# Patient Record
Sex: Female | Born: 1970
Health system: Southern US, Community
[De-identification: ages and names within clinical notes are randomized; demographics above are authoritative.]

## PROBLEM LIST (undated history)

## (undated) DIAGNOSIS — E559 Vitamin D deficiency, unspecified: Secondary | ICD-10-CM

## (undated) DIAGNOSIS — K219 Gastro-esophageal reflux disease without esophagitis: Secondary | ICD-10-CM

## (undated) DIAGNOSIS — M2550 Pain in unspecified joint: Secondary | ICD-10-CM

## (undated) DIAGNOSIS — E039 Hypothyroidism, unspecified: Secondary | ICD-10-CM

## (undated) DIAGNOSIS — D649 Anemia, unspecified: Secondary | ICD-10-CM

## (undated) DIAGNOSIS — E538 Deficiency of other specified B group vitamins: Secondary | ICD-10-CM

## (undated) DIAGNOSIS — R0600 Dyspnea, unspecified: Secondary | ICD-10-CM

## (undated) DIAGNOSIS — M549 Dorsalgia, unspecified: Secondary | ICD-10-CM

## (undated) DIAGNOSIS — R030 Elevated blood-pressure reading, without diagnosis of hypertension: Secondary | ICD-10-CM

## (undated) DIAGNOSIS — R232 Flushing: Secondary | ICD-10-CM

## (undated) DIAGNOSIS — M5432 Sciatica, left side: Secondary | ICD-10-CM

## (undated) DIAGNOSIS — K2210 Ulcer of esophagus without bleeding: Secondary | ICD-10-CM

## (undated) HISTORY — DX: Deficiency of other specified B group vitamins: E53.8

## (undated) HISTORY — DX: Anemia, unspecified: D64.9

## (undated) HISTORY — DX: Dorsalgia, unspecified: M54.9

## (undated) HISTORY — DX: Elevated blood-pressure reading, without diagnosis of hypertension: R03.0

## (undated) HISTORY — DX: Hypothyroidism, unspecified: E03.9

## (undated) HISTORY — DX: Ulcer of esophagus without bleeding: K22.10

## (undated) HISTORY — DX: Flushing: R23.2

## (undated) HISTORY — DX: Sciatica, left side: M54.32

## (undated) HISTORY — DX: Dyspnea, unspecified: R06.00

## (undated) HISTORY — DX: Vitamin D deficiency, unspecified: E55.9

## (undated) HISTORY — DX: Gastro-esophageal reflux disease without esophagitis: K21.9

## (undated) HISTORY — PX: OVARIAN CYST SURGERY: SHX726

## (undated) HISTORY — PX: DILATION AND CURETTAGE OF UTERUS: SHX78

## (undated) HISTORY — DX: Pain in unspecified joint: M25.50

---

## 2000-08-04 ENCOUNTER — Encounter: Payer: Self-pay | Admitting: Emergency Medicine

## 2000-08-04 ENCOUNTER — Emergency Department (HOSPITAL_COMMUNITY): Admission: EM | Admit: 2000-08-04 | Discharge: 2000-08-04 | Payer: Self-pay | Admitting: Emergency Medicine

## 2004-10-26 ENCOUNTER — Other Ambulatory Visit: Admission: RE | Admit: 2004-10-26 | Discharge: 2004-10-26 | Payer: Self-pay | Admitting: Family Medicine

## 2005-06-22 ENCOUNTER — Encounter: Admission: RE | Admit: 2005-06-22 | Discharge: 2005-06-22 | Payer: Self-pay | Admitting: Family Medicine

## 2006-01-04 ENCOUNTER — Other Ambulatory Visit: Admission: RE | Admit: 2006-01-04 | Discharge: 2006-01-04 | Payer: Self-pay | Admitting: Family Medicine

## 2006-12-29 ENCOUNTER — Emergency Department (HOSPITAL_COMMUNITY): Admission: EM | Admit: 2006-12-29 | Discharge: 2006-12-29 | Payer: Self-pay | Admitting: Emergency Medicine

## 2007-09-07 ENCOUNTER — Inpatient Hospital Stay (HOSPITAL_COMMUNITY): Admission: AD | Admit: 2007-09-07 | Discharge: 2007-09-10 | Payer: Self-pay | Admitting: Obstetrics and Gynecology

## 2007-09-16 ENCOUNTER — Ambulatory Visit: Admission: RE | Admit: 2007-09-16 | Discharge: 2007-09-16 | Payer: Self-pay | Admitting: Obstetrics and Gynecology

## 2009-01-06 ENCOUNTER — Emergency Department (HOSPITAL_COMMUNITY): Admission: EM | Admit: 2009-01-06 | Discharge: 2009-01-06 | Payer: Self-pay | Admitting: Emergency Medicine

## 2009-10-05 ENCOUNTER — Encounter: Admission: RE | Admit: 2009-10-05 | Discharge: 2009-10-05 | Payer: Self-pay | Admitting: Gastroenterology

## 2010-07-24 ENCOUNTER — Emergency Department (HOSPITAL_COMMUNITY): Admission: EM | Admit: 2010-07-24 | Discharge: 2010-07-24 | Payer: Self-pay | Admitting: Emergency Medicine

## 2010-07-27 ENCOUNTER — Emergency Department (HOSPITAL_COMMUNITY): Admission: EM | Admit: 2010-07-27 | Discharge: 2010-07-27 | Payer: Self-pay | Admitting: Family Medicine

## 2010-07-31 ENCOUNTER — Emergency Department (HOSPITAL_COMMUNITY): Admission: EM | Admit: 2010-07-31 | Discharge: 2010-07-31 | Payer: Self-pay | Admitting: Emergency Medicine

## 2010-08-07 ENCOUNTER — Emergency Department (HOSPITAL_COMMUNITY): Admission: EM | Admit: 2010-08-07 | Discharge: 2010-08-07 | Payer: Self-pay | Admitting: Family Medicine

## 2011-08-06 ENCOUNTER — Other Ambulatory Visit (HOSPITAL_COMMUNITY): Payer: Self-pay | Admitting: Obstetrics and Gynecology

## 2011-08-06 DIAGNOSIS — N979 Female infertility, unspecified: Secondary | ICD-10-CM

## 2011-08-14 ENCOUNTER — Ambulatory Visit (HOSPITAL_COMMUNITY)
Admission: RE | Admit: 2011-08-14 | Discharge: 2011-08-14 | Disposition: A | Discharge status: Home / Self Care | Payer: BC Managed Care – PPO | Source: Ambulatory Visit | Attending: Obstetrics and Gynecology | Admitting: Obstetrics and Gynecology

## 2011-08-14 DIAGNOSIS — N979 Female infertility, unspecified: Secondary | ICD-10-CM

## 2011-09-27 LAB — CBC
HCT: 26.6 — ABNORMAL LOW
HCT: 33.9 — ABNORMAL LOW
Hemoglobin: 11.9 — ABNORMAL LOW
Hemoglobin: 8.9 — ABNORMAL LOW
MCHC: 33.6
MCHC: 35.2
MCV: 83.1
MCV: 85.5
Platelets: 197
Platelets: 212
RBC: 4.07
RDW: 14.8 — ABNORMAL HIGH
RDW: 14.8 — ABNORMAL HIGH
WBC: 12.2 — ABNORMAL HIGH

## 2011-10-03 ENCOUNTER — Other Ambulatory Visit: Payer: Self-pay | Admitting: Obstetrics and Gynecology

## 2011-10-03 DIAGNOSIS — R928 Other abnormal and inconclusive findings on diagnostic imaging of breast: Secondary | ICD-10-CM

## 2011-10-18 ENCOUNTER — Ambulatory Visit
Admission: RE | Admit: 2011-10-18 | Discharge: 2011-10-18 | Disposition: A | Discharge status: Home / Self Care | Payer: BC Managed Care – PPO | Source: Ambulatory Visit | Attending: Obstetrics and Gynecology | Admitting: Obstetrics and Gynecology

## 2011-10-18 ENCOUNTER — Other Ambulatory Visit: Payer: Self-pay | Admitting: Obstetrics and Gynecology

## 2011-10-18 DIAGNOSIS — N63 Unspecified lump in unspecified breast: Secondary | ICD-10-CM

## 2011-10-18 DIAGNOSIS — R928 Other abnormal and inconclusive findings on diagnostic imaging of breast: Secondary | ICD-10-CM

## 2012-04-16 ENCOUNTER — Ambulatory Visit
Admission: RE | Admit: 2012-04-16 | Discharge: 2012-04-16 | Disposition: A | Discharge status: Home / Self Care | Payer: BC Managed Care – PPO | Source: Ambulatory Visit | Attending: Obstetrics and Gynecology | Admitting: Obstetrics and Gynecology

## 2012-04-16 DIAGNOSIS — N63 Unspecified lump in unspecified breast: Secondary | ICD-10-CM

## 2012-11-27 ENCOUNTER — Other Ambulatory Visit: Payer: Self-pay | Admitting: Obstetrics and Gynecology

## 2012-11-27 DIAGNOSIS — N63 Unspecified lump in unspecified breast: Secondary | ICD-10-CM

## 2012-12-12 ENCOUNTER — Other Ambulatory Visit: Payer: Self-pay | Admitting: Obstetrics and Gynecology

## 2012-12-12 ENCOUNTER — Ambulatory Visit
Admission: RE | Admit: 2012-12-12 | Discharge: 2012-12-12 | Disposition: A | Discharge status: Home / Self Care | Payer: BC Managed Care – PPO | Source: Ambulatory Visit | Attending: Obstetrics and Gynecology | Admitting: Obstetrics and Gynecology

## 2012-12-12 DIAGNOSIS — N63 Unspecified lump in unspecified breast: Secondary | ICD-10-CM

## 2013-06-15 ENCOUNTER — Other Ambulatory Visit: Payer: Self-pay | Admitting: Obstetrics and Gynecology

## 2013-06-15 DIAGNOSIS — N63 Unspecified lump in unspecified breast: Secondary | ICD-10-CM

## 2013-07-03 ENCOUNTER — Ambulatory Visit
Admission: RE | Admit: 2013-07-03 | Discharge: 2013-07-03 | Disposition: A | Discharge status: Home / Self Care | Payer: BC Managed Care – PPO | Source: Ambulatory Visit | Attending: Obstetrics and Gynecology | Admitting: Obstetrics and Gynecology

## 2013-07-03 DIAGNOSIS — N63 Unspecified lump in unspecified breast: Secondary | ICD-10-CM

## 2014-01-01 ENCOUNTER — Other Ambulatory Visit: Payer: Self-pay

## 2014-01-01 DIAGNOSIS — Z1231 Encounter for screening mammogram for malignant neoplasm of breast: Secondary | ICD-10-CM

## 2014-02-03 ENCOUNTER — Ambulatory Visit: Payer: BC Managed Care – PPO

## 2014-03-10 ENCOUNTER — Other Ambulatory Visit: Payer: Self-pay | Admitting: Obstetrics and Gynecology

## 2014-03-10 ENCOUNTER — Ambulatory Visit
Admission: RE | Admit: 2014-03-10 | Discharge: 2014-03-10 | Disposition: A | Discharge status: Home / Self Care | Payer: Self-pay | Source: Ambulatory Visit

## 2014-03-10 DIAGNOSIS — N6489 Other specified disorders of breast: Secondary | ICD-10-CM

## 2014-03-10 DIAGNOSIS — Z1231 Encounter for screening mammogram for malignant neoplasm of breast: Secondary | ICD-10-CM

## 2014-04-27 ENCOUNTER — Ambulatory Visit
Admission: RE | Admit: 2014-04-27 | Discharge: 2014-04-27 | Disposition: A | Discharge status: Home / Self Care | Payer: BC Managed Care – PPO | Source: Ambulatory Visit | Attending: Family Medicine | Admitting: Family Medicine

## 2014-04-27 ENCOUNTER — Other Ambulatory Visit: Payer: Self-pay | Admitting: Family Medicine

## 2014-04-27 DIAGNOSIS — R52 Pain, unspecified: Secondary | ICD-10-CM

## 2014-04-27 MED ORDER — IOHEXOL 300 MG/ML  SOLN
75.0000 mL | Freq: Once | INTRAMUSCULAR | Status: AC | PRN
  Administered 2014-04-27: 75 mL via INTRAVENOUS

## 2014-07-21 ENCOUNTER — Ambulatory Visit
Admission: RE | Admit: 2014-07-21 | Discharge: 2014-07-21 | Disposition: A | Discharge status: Home / Self Care | Payer: BC Managed Care – PPO | Source: Ambulatory Visit | Attending: Obstetrics and Gynecology | Admitting: Obstetrics and Gynecology

## 2014-07-21 ENCOUNTER — Encounter (INDEPENDENT_AMBULATORY_CARE_PROVIDER_SITE_OTHER): Payer: Self-pay

## 2014-07-21 DIAGNOSIS — N6489 Other specified disorders of breast: Secondary | ICD-10-CM

## 2016-03-28 DIAGNOSIS — F331 Major depressive disorder, recurrent, moderate: Secondary | ICD-10-CM | POA: Diagnosis not present

## 2016-09-17 DIAGNOSIS — Z23 Encounter for immunization: Secondary | ICD-10-CM | POA: Diagnosis not present

## 2016-09-17 DIAGNOSIS — L814 Other melanin hyperpigmentation: Secondary | ICD-10-CM | POA: Diagnosis not present

## 2016-09-19 DIAGNOSIS — E559 Vitamin D deficiency, unspecified: Secondary | ICD-10-CM | POA: Diagnosis not present

## 2016-09-19 DIAGNOSIS — R7309 Other abnormal glucose: Secondary | ICD-10-CM | POA: Diagnosis not present

## 2016-09-19 DIAGNOSIS — E538 Deficiency of other specified B group vitamins: Secondary | ICD-10-CM | POA: Diagnosis not present

## 2016-09-19 DIAGNOSIS — Z713 Dietary counseling and surveillance: Secondary | ICD-10-CM | POA: Diagnosis not present

## 2016-09-19 DIAGNOSIS — F324 Major depressive disorder, single episode, in partial remission: Secondary | ICD-10-CM | POA: Diagnosis not present

## 2016-09-19 DIAGNOSIS — R7303 Prediabetes: Secondary | ICD-10-CM | POA: Diagnosis not present

## 2016-11-13 DIAGNOSIS — Z6839 Body mass index (BMI) 39.0-39.9, adult: Secondary | ICD-10-CM | POA: Diagnosis not present

## 2016-11-13 DIAGNOSIS — E039 Hypothyroidism, unspecified: Secondary | ICD-10-CM | POA: Diagnosis not present

## 2016-11-13 DIAGNOSIS — Z01419 Encounter for gynecological examination (general) (routine) without abnormal findings: Secondary | ICD-10-CM | POA: Diagnosis not present

## 2016-11-13 DIAGNOSIS — Z1231 Encounter for screening mammogram for malignant neoplasm of breast: Secondary | ICD-10-CM | POA: Diagnosis not present

## 2016-11-29 DIAGNOSIS — Z713 Dietary counseling and surveillance: Secondary | ICD-10-CM | POA: Diagnosis not present

## 2016-11-29 DIAGNOSIS — Z6839 Body mass index (BMI) 39.0-39.9, adult: Secondary | ICD-10-CM | POA: Diagnosis not present

## 2016-11-29 DIAGNOSIS — F325 Major depressive disorder, single episode, in full remission: Secondary | ICD-10-CM | POA: Diagnosis not present

## 2016-11-29 DIAGNOSIS — E6609 Other obesity due to excess calories: Secondary | ICD-10-CM | POA: Diagnosis not present

## 2016-12-05 DIAGNOSIS — J019 Acute sinusitis, unspecified: Secondary | ICD-10-CM | POA: Diagnosis not present

## 2016-12-05 DIAGNOSIS — R05 Cough: Secondary | ICD-10-CM | POA: Diagnosis not present

## 2017-01-09 DIAGNOSIS — Z13820 Encounter for screening for osteoporosis: Secondary | ICD-10-CM | POA: Diagnosis not present

## 2017-01-09 DIAGNOSIS — Z8262 Family history of osteoporosis: Secondary | ICD-10-CM | POA: Diagnosis not present

## 2017-01-09 DIAGNOSIS — E039 Hypothyroidism, unspecified: Secondary | ICD-10-CM | POA: Diagnosis not present

## 2017-01-18 DIAGNOSIS — S93402A Sprain of unspecified ligament of left ankle, initial encounter: Secondary | ICD-10-CM | POA: Diagnosis not present

## 2017-01-18 DIAGNOSIS — S93602A Unspecified sprain of left foot, initial encounter: Secondary | ICD-10-CM | POA: Diagnosis not present

## 2017-03-13 DIAGNOSIS — Z6839 Body mass index (BMI) 39.0-39.9, adult: Secondary | ICD-10-CM | POA: Diagnosis not present

## 2017-03-13 DIAGNOSIS — Z713 Dietary counseling and surveillance: Secondary | ICD-10-CM | POA: Diagnosis not present

## 2017-03-13 DIAGNOSIS — E6609 Other obesity due to excess calories: Secondary | ICD-10-CM | POA: Diagnosis not present

## 2017-08-13 DIAGNOSIS — K219 Gastro-esophageal reflux disease without esophagitis: Secondary | ICD-10-CM | POA: Diagnosis not present

## 2017-08-18 DIAGNOSIS — K219 Gastro-esophageal reflux disease without esophagitis: Secondary | ICD-10-CM | POA: Insufficient documentation

## 2017-08-18 DIAGNOSIS — E038 Other specified hypothyroidism: Secondary | ICD-10-CM | POA: Insufficient documentation

## 2017-10-15 DIAGNOSIS — E039 Hypothyroidism, unspecified: Secondary | ICD-10-CM | POA: Diagnosis not present

## 2017-10-15 DIAGNOSIS — Z79899 Other long term (current) drug therapy: Secondary | ICD-10-CM | POA: Diagnosis not present

## 2017-10-15 DIAGNOSIS — E538 Deficiency of other specified B group vitamins: Secondary | ICD-10-CM | POA: Diagnosis not present

## 2017-10-15 DIAGNOSIS — Z6838 Body mass index (BMI) 38.0-38.9, adult: Secondary | ICD-10-CM | POA: Diagnosis not present

## 2017-10-15 DIAGNOSIS — R7303 Prediabetes: Secondary | ICD-10-CM | POA: Diagnosis not present

## 2017-10-15 DIAGNOSIS — Z23 Encounter for immunization: Secondary | ICD-10-CM | POA: Diagnosis not present

## 2017-10-15 DIAGNOSIS — E559 Vitamin D deficiency, unspecified: Secondary | ICD-10-CM | POA: Diagnosis not present

## 2017-10-15 DIAGNOSIS — K219 Gastro-esophageal reflux disease without esophagitis: Secondary | ICD-10-CM | POA: Diagnosis not present

## 2017-10-16 DIAGNOSIS — G43B0 Ophthalmoplegic migraine, not intractable: Secondary | ICD-10-CM | POA: Diagnosis not present

## 2017-12-17 HISTORY — PX: OTHER SURGICAL HISTORY: SHX169

## 2018-01-01 DIAGNOSIS — Z01419 Encounter for gynecological examination (general) (routine) without abnormal findings: Secondary | ICD-10-CM | POA: Diagnosis not present

## 2018-01-01 DIAGNOSIS — Z6838 Body mass index (BMI) 38.0-38.9, adult: Secondary | ICD-10-CM | POA: Diagnosis not present

## 2018-01-01 DIAGNOSIS — Z1231 Encounter for screening mammogram for malignant neoplasm of breast: Secondary | ICD-10-CM | POA: Diagnosis not present

## 2018-01-27 DIAGNOSIS — J069 Acute upper respiratory infection, unspecified: Secondary | ICD-10-CM | POA: Diagnosis not present

## 2018-04-06 DIAGNOSIS — M5417 Radiculopathy, lumbosacral region: Secondary | ICD-10-CM | POA: Diagnosis not present

## 2018-04-06 DIAGNOSIS — M533 Sacrococcygeal disorders, not elsewhere classified: Secondary | ICD-10-CM | POA: Diagnosis not present

## 2018-04-06 DIAGNOSIS — G5702 Lesion of sciatic nerve, left lower limb: Secondary | ICD-10-CM | POA: Diagnosis not present

## 2018-04-06 DIAGNOSIS — M5432 Sciatica, left side: Secondary | ICD-10-CM | POA: Diagnosis not present

## 2018-04-17 DIAGNOSIS — M5432 Sciatica, left side: Secondary | ICD-10-CM | POA: Diagnosis not present

## 2018-04-17 DIAGNOSIS — R030 Elevated blood-pressure reading, without diagnosis of hypertension: Secondary | ICD-10-CM | POA: Diagnosis not present

## 2018-04-17 DIAGNOSIS — E6609 Other obesity due to excess calories: Secondary | ICD-10-CM | POA: Diagnosis not present

## 2018-04-29 DIAGNOSIS — M5416 Radiculopathy, lumbar region: Secondary | ICD-10-CM | POA: Diagnosis not present

## 2018-04-29 DIAGNOSIS — R293 Abnormal posture: Secondary | ICD-10-CM | POA: Diagnosis not present

## 2018-04-29 DIAGNOSIS — M25552 Pain in left hip: Secondary | ICD-10-CM | POA: Diagnosis not present

## 2018-05-01 DIAGNOSIS — R293 Abnormal posture: Secondary | ICD-10-CM | POA: Diagnosis not present

## 2018-05-01 DIAGNOSIS — M25552 Pain in left hip: Secondary | ICD-10-CM | POA: Diagnosis not present

## 2018-05-01 DIAGNOSIS — M5416 Radiculopathy, lumbar region: Secondary | ICD-10-CM | POA: Diagnosis not present

## 2018-05-06 ENCOUNTER — Encounter (INDEPENDENT_AMBULATORY_CARE_PROVIDER_SITE_OTHER): Payer: Self-pay

## 2018-05-06 DIAGNOSIS — M5416 Radiculopathy, lumbar region: Secondary | ICD-10-CM | POA: Diagnosis not present

## 2018-05-06 DIAGNOSIS — M25552 Pain in left hip: Secondary | ICD-10-CM | POA: Diagnosis not present

## 2018-05-06 DIAGNOSIS — R262 Difficulty in walking, not elsewhere classified: Secondary | ICD-10-CM | POA: Diagnosis not present

## 2018-05-06 DIAGNOSIS — R293 Abnormal posture: Secondary | ICD-10-CM | POA: Diagnosis not present

## 2018-05-08 DIAGNOSIS — M25552 Pain in left hip: Secondary | ICD-10-CM | POA: Diagnosis not present

## 2018-05-08 DIAGNOSIS — R293 Abnormal posture: Secondary | ICD-10-CM | POA: Diagnosis not present

## 2018-05-08 DIAGNOSIS — M5416 Radiculopathy, lumbar region: Secondary | ICD-10-CM | POA: Diagnosis not present

## 2018-05-08 DIAGNOSIS — R262 Difficulty in walking, not elsewhere classified: Secondary | ICD-10-CM | POA: Diagnosis not present

## 2018-05-09 DIAGNOSIS — M5416 Radiculopathy, lumbar region: Secondary | ICD-10-CM | POA: Diagnosis not present

## 2018-05-09 DIAGNOSIS — R293 Abnormal posture: Secondary | ICD-10-CM | POA: Diagnosis not present

## 2018-05-09 DIAGNOSIS — M25552 Pain in left hip: Secondary | ICD-10-CM | POA: Diagnosis not present

## 2018-05-09 DIAGNOSIS — R262 Difficulty in walking, not elsewhere classified: Secondary | ICD-10-CM | POA: Diagnosis not present

## 2018-05-13 DIAGNOSIS — M25552 Pain in left hip: Secondary | ICD-10-CM | POA: Diagnosis not present

## 2018-05-13 DIAGNOSIS — R262 Difficulty in walking, not elsewhere classified: Secondary | ICD-10-CM | POA: Diagnosis not present

## 2018-05-13 DIAGNOSIS — R293 Abnormal posture: Secondary | ICD-10-CM | POA: Diagnosis not present

## 2018-05-13 DIAGNOSIS — M5416 Radiculopathy, lumbar region: Secondary | ICD-10-CM | POA: Diagnosis not present

## 2018-05-14 ENCOUNTER — Ambulatory Visit (INDEPENDENT_AMBULATORY_CARE_PROVIDER_SITE_OTHER): Payer: BLUE CROSS/BLUE SHIELD | Admitting: Family Medicine

## 2018-05-14 ENCOUNTER — Encounter (INDEPENDENT_AMBULATORY_CARE_PROVIDER_SITE_OTHER): Payer: Self-pay | Admitting: Family Medicine

## 2018-05-14 VITALS — BP 134/89 | HR 77 | Temp 98.0°F | Ht 67.0 in | Wt 242.0 lb

## 2018-05-14 DIAGNOSIS — E038 Other specified hypothyroidism: Secondary | ICD-10-CM

## 2018-05-14 DIAGNOSIS — E559 Vitamin D deficiency, unspecified: Secondary | ICD-10-CM

## 2018-05-14 DIAGNOSIS — R5383 Other fatigue: Secondary | ICD-10-CM | POA: Diagnosis not present

## 2018-05-14 DIAGNOSIS — R0602 Shortness of breath: Secondary | ICD-10-CM | POA: Diagnosis not present

## 2018-05-14 DIAGNOSIS — Z1331 Encounter for screening for depression: Secondary | ICD-10-CM

## 2018-05-14 DIAGNOSIS — Z9189 Other specified personal risk factors, not elsewhere classified: Secondary | ICD-10-CM

## 2018-05-14 DIAGNOSIS — Z6838 Body mass index (BMI) 38.0-38.9, adult: Secondary | ICD-10-CM

## 2018-05-14 DIAGNOSIS — Z0289 Encounter for other administrative examinations: Secondary | ICD-10-CM

## 2018-05-14 NOTE — Progress Notes (Signed)
Office: [phone]  /  Fax: [phone]   Dear Dr. Cliffton Asters,   Thank you for referring Katelyn Walker to our clinic. The following note includes my evaluation and treatment recommendations.  HPI:   Chief Complaint: OBESITY    Katelyn Walker has been referred by Katelyn Montana, MD for consultation regarding her obesity and obesity related comorbidities.    Katelyn Walker (MR# [id number]) is a 47 y.o. female who presents on 05/14/2018 for obesity evaluation and treatment. Current BMI is Body mass index is 37.9 kg/m.Marland Kitchen Katelyn Walker has been struggling with her weight for many years and has been unsuccessful in either losing weight, maintaining weight loss, or reaching her healthy weight goal.     Katelyn Walker had done Belviq and Contrave in the past but had nausea and dizziness with Contrave, and Belviq was not covered by insurance.     Katelyn Walker attended our information session and states she is currently in the action stage of change and ready to dedicate time achieving and maintaining a healthier weight. Katelyn Walker is interested in becoming our Katelyn Walker and working on intensive lifestyle modifications including (but not limited to) diet, exercise and weight loss.    Katelyn Walker states her family eats meals together she thinks her family will eat healthier with  her her desired weight loss is 62 lbs she has been heavy most of  her life she started gaining weight at age 74 her heaviest weight ever was 255 lbs she has significant food cravings issues  she frequently makes poor food choices she frequently eats larger portions than normal  she struggles with emotional eating    Fatigue Katelyn Walker feels her energy is lower than it should be. This has worsened with weight gain and has not worsened recently. Katelyn Walker admits to daytime somnolence and  admits to waking up still tired. Katelyn Walker is at risk for obstructive sleep apnea. Patent has a history of symptoms of daytime fatigue. Katelyn Walker generally  gets 6 hours of sleep per night, and states they generally have generally restful sleep. Snoring is present. Apneic episodes are present. Epworth Sleepiness Score is 5.  Dyspnea on exertion Katelyn Walker notes increasing shortness of breath with exercising and seems to be worsening over time with weight gain. She notes getting out of breath sooner with activity than she used to. This has not gotten worse recently. Katelyn Walker denies orthopnea.  Vitamin D Deficiency Katelyn Walker has a diagnosis of vitamin D deficiency. She is not on Vit D currently. She notes fatigue and denies nausea, vomiting or muscle weakness.  Hypothyroidism Katelyn Walker has a diagnosis of hypothyroidism. She is on levothyroxine. She denies tremor, hot or cold intolerance, or palpitations, but does admit to ongoing fatigue.  At risk for cardiovascular disease Katelyn Walker is at a higher than average risk for cardiovascular disease due to obesity and hypothyroidism. She currently denies any chest pain.  Depression Screen Katelyn Walker Food and Mood (modified PHQ-9) score was  Depression screen PHQ 2/9 05/14/2018  Decreased Interest 1  Down, Depressed, Hopeless 2  PHQ - 2 Score 3  Altered sleeping 3  Tired, decreased energy 2  Change in appetite 1  Feeling bad or failure about yourself  1  Trouble concentrating 1  Moving slowly or fidgety/restless 0  Suicidal thoughts 0  PHQ-9 Score 11  Difficult doing work/chores Not difficult at all    ALLERGIES: Allergies  Allergen Reactions  . Contrave [Naltrexone-Bupropion Hcl Er] Other (See Comments)    Vertigo/ dizziness    MEDICATIONS: Current Outpatient Medications  on File Prior to Visit  Medication Sig Dispense Refill  . buPROPion (WELLBUTRIN XL) 300 MG 24 hr tablet Take 300 mg by mouth daily.    Marland Kitchen HYDROcodone-acetaminophen (NORCO/VICODIN) 5-325 MG tablet Take 1 tablet by mouth every 6 (six) hours as needed for moderate pain (left side sciatica).    Marland Kitchen levothyroxine (SYNTHROID, LEVOTHROID)  75 MCG tablet Take 75 mcg by mouth daily before breakfast.    . naproxen (NAPROSYN) 500 MG tablet Take 500 mg by mouth 2 (two) times daily with a meal.    . Norethindrone Acetate-Ethinyl Estrad-FE (BLISOVI 24 FE) 1-20 MG-MCG(24) tablet Take 1 tablet by mouth daily.    Marland Kitchen omeprazole (PRILOSEC) 40 MG capsule Take 40 mg by mouth daily.    Marland Kitchen zolpidem (AMBIEN) 10 MG tablet Take 10 mg by mouth at bedtime as needed for sleep.     No current facility-administered medications on file prior to visit.     PAST MEDICAL HISTORY: Past Medical History:  Diagnosis Date  . Back pain   . Dyspnea   . Elevated blood pressure reading without diagnosis of hypertension   . Esophageal ulcer   . GERD (gastroesophageal reflux disease)   . Hot flashes   . Hypothyroid   . Joint pain   . Sciatica, left side   . Vitamin B 12 deficiency   . Vitamin D deficiency     PAST SURGICAL HISTORY: Past Surgical History:  Procedure Laterality Date  . DILATION AND CURETTAGE OF UTERUS     2011   . OVARIAN CYST SURGERY     2009, 2013    SOCIAL HISTORY: Social History   Tobacco Use  . Smoking status: Never Smoker  . Smokeless tobacco: Never Used  Substance Use Topics  . Alcohol use: Not on file  . Drug use: Not on file    FAMILY HISTORY: Family History  Problem Relation Age of Onset  . Alcoholism Mother   . Eating disorder Mother   . Diabetes Father   . Hypertension Father   . Sleep apnea Father     ROS: Review of Systems  Constitutional: Positive for malaise/fatigue. Negative for weight loss.       + Trouble sleeping  Eyes: Positive for photophobia.       + Wear glasses or contacts  Respiratory: Positive for shortness of breath (with exertion).   Cardiovascular: Negative for chest pain and orthopnea.  Gastrointestinal: Negative for nausea and vomiting.  Musculoskeletal: Positive for back pain.       Negative muscle weakness + Muscle or joint pain  Neurological: Negative for tremors.    Endo/Heme/Allergies:       Negative hot/cold intolerance    PHYSICAL EXAM: Blood pressure 134/89, pulse 77, temperature 98 F (36.7 C), temperature source Oral, height  (1.702 m), weight 242 lb (109.8 kg), SpO2 99 %. Body mass index is 37.9 kg/m. Physical Exam  Constitutional: She is oriented to person, place, and time. She appears well-developed and well-nourished.  HENT:  Head: Normocephalic and atraumatic.  Nose: Nose normal.  Eyes: EOM are normal. No scleral icterus.  Neck: Normal range of motion. Neck supple. No thyromegaly present.  Cardiovascular: Normal rate and regular rhythm.  Pulmonary/Chest: Effort normal. No respiratory distress.  Abdominal: Soft. There is no tenderness.  + Obesity  Musculoskeletal:  Range of Motion normal in all 4 extremities Trace edema noted in bilateral lower extremities  Neurological: She is alert and oriented to person, place, and time. Coordination normal.  Skin: Skin is warm and dry.  Psychiatric: She has a normal mood and affect. Her behavior is normal.  Vitals reviewed.   RECENT LABS AND TESTS: BMET No results found for: NA, K, CL, CO2, GLUCOSE, BUN, CREATININE, CALCIUM, GFRNONAA, GFRAA No results found for: HGBA1C No results found for: INSULIN CBC    Component Value Date/Time   WBC 13.7 (H) 09/09/2007 0546   RBC 3.11 (L) 09/09/2007 0546   HGB 8.9 DELTA CHECK NOTED (L) 09/09/2007 0546   HCT 26.6 (L) 09/09/2007 0546   PLT 197 09/09/2007 0546   MCV 85.5 09/09/2007 0546   MCHC 33.6 09/09/2007 0546   RDW 14.8 (H) 09/09/2007 0546   Iron/TIBC/Ferritin/ %Sat No results found for: IRON, TIBC, FERRITIN, IRONPCTSAT Lipid Panel  No results found for: CHOL, TRIG, HDL, CHOLHDL, VLDL, LDLCALC, LDLDIRECT Hepatic Function Panel  No results found for: PROT, ALBUMIN, AST, ALT, ALKPHOS, BILITOT, BILIDIR, IBILI No results found for: TSH  ECG  shows NSR with a rate of 74 BPM INDIRECT CALORIMETER done today shows a VO2 of 240 and a REE  of 1670.  Her calculated basal metabolic rate is 1610 thus her basal metabolic rate is worse than expected.    ASSESSMENT AND PLAN: Other fatigue - Plan: EKG 12-Lead  Shortness of breath on exertion  Vitamin D deficiency  Other specified hypothyroidism  Depression screening  At risk for heart disease  Class 2 severe obesity with serious comorbidity and body mass index (BMI) of 38.0 to 38.9 in adult, unspecified obesity type (HCC)  PLAN:  Fatigue Geneive was informed that her fatigue may be related to obesity, depression or many other causes. Labs will be ordered, and in the meanwhile Khala has agreed to work on diet, exercise and weight loss to help with fatigue. Proper sleep hygiene was discussed including the need for 7-8 hours of quality sleep each night. A sleep study was not ordered based on symptoms and Epworth score.  Dyspnea on exertion Timmya's shortness of breath appears to be obesity related and exercise induced. She has agreed to work on weight loss and gradually increase exercise to treat her exercise induced shortness of breath. If Ailah follows our instructions and loses weight without improvement of her shortness of breath, we will plan to refer to pulmonology. We will monitor this condition regularly. Katelyn Walker agrees to this plan.  Vitamin D Deficiency Asyah was informed that low vitamin D levels contributes to fatigue and are associated with obesity, breast, and colon cancer. She will follow up for routine testing of vitamin D, at least 2-3 times per year. She was informed of the risk of over-replacement of vitamin D and agrees to not increase her dose unless she discusses this with [name] first. We will check labs and Katelyn Walker agrees to follow up with our clinic in 2 weeks.  Hypothyroidism Cledith was informed of the importance of good thyroid control to help with weight loss efforts. She was also informed that supertheraputic thyroid levels are dangerous and will  not improve weight loss results. Dezyrae agrees to continue taking levothyroxine as is, we will check labs and Katelyn Walker agrees to follow up with our clinic in 2 weeks.  Cardiovascular risk counselling Katelyn Walker was given extended (15 minutes) coronary artery disease prevention counseling today. She is 47 y.o. female and has risk factors for heart disease including obesity and hypothyroidism. We discussed intensive lifestyle modifications today with an emphasis on specific weight loss instructions and strategies. Pt was also informed of the importance  of increasing exercise and decreasing saturated fats to help prevent heart disease.  Depression Screen Katelyn Walker had a moderately positive depression screening. Depression is commonly associated with obesity and often results in emotional eating behaviors. We will monitor this closely and work on CBT to help improve the non-hunger eating patterns. Referral to Psychology may be required if no improvement is seen as she continues in our clinic.  Obesity Lilliauna is currently in the action stage of change and her goal is to continue with weight loss efforts. I recommend Jennessa begin the structured treatment plan as follows:  She has agreed to follow the Category 2 plan with Category 3 plan breakfast Shavonta has been instructed to eventually work up to a goal of 150 minutes of combined cardio and strengthening exercise per week for weight loss and overall health benefits. We discussed the following Behavioral Modification Strategies today: increasing lean protein intake, decreasing simple carbohydrates  and work on meal planning and easy cooking plans   She was informed of the importance of frequent follow up visits to maximize her success with intensive lifestyle modifications for her multiple health conditions. She was informed we would discuss her lab results at her next visit unless there is a critical issue that needs to be addressed sooner. Cheryal agreed  to keep her next visit at the agreed upon time to discuss these results.    OBESITY BEHAVIORAL INTERVENTION VISIT  Today's visit was # 1 out of 22.  Starting weight: 242 lbs Starting date: 05/14/18 Today's weight : 242 lbs  Today's date: 05/14/2018 Total lbs lost to date: 0 (Patients must lose 7 lbs in the first 6 months to continue with counseling)   ASK: We discussed the diagnosis of obesity with Gevena Barre today and Kailany agreed to give [name] permission to discuss obesity behavioral modification therapy today.  ASSESS: [place] has the diagnosis of obesity and her BMI today is 37.89 Sierria is in the action stage of change   ADVISE: Mazzie was educated on the multiple health risks of obesity as well as the benefit of weight loss to improve her health. She was advised of the need for long term treatment and the importance of lifestyle modifications.  AGREE: Multiple dietary modification options and treatment options were discussed and  Jazleen agreed to the above obesity treatment plan.   I, Burt Knack, am acting as transcriptionist for Quillian Quince, MD  I have reviewed the above documentation for accuracy and completeness, and I agree with the above. -Quillian Quince, MD

## 2018-05-15 ENCOUNTER — Other Ambulatory Visit: Payer: Self-pay | Admitting: Family Medicine

## 2018-05-15 ENCOUNTER — Ambulatory Visit
Admission: RE | Admit: 2018-05-15 | Discharge: 2018-05-15 | Disposition: A | Discharge status: Home / Self Care | Payer: BLUE CROSS/BLUE SHIELD | Source: Ambulatory Visit | Attending: Family Medicine | Admitting: Family Medicine

## 2018-05-15 DIAGNOSIS — M25552 Pain in left hip: Secondary | ICD-10-CM

## 2018-05-15 DIAGNOSIS — M1612 Unilateral primary osteoarthritis, left hip: Secondary | ICD-10-CM | POA: Diagnosis not present

## 2018-05-15 LAB — TSH: TSH: 2.83 u[IU]/mL (ref 0.450–4.500)

## 2018-05-15 LAB — COMPREHENSIVE METABOLIC PANEL
ALK PHOS: 76 IU/L (ref 39–117)
ALT: 9 IU/L (ref 0–32)
AST: 13 IU/L (ref 0–40)
Albumin/Globulin Ratio: 1.6 (ref 1.2–2.2)
Albumin: 4.1 g/dL (ref 3.5–5.5)
BILIRUBIN TOTAL: 0.2 mg/dL (ref 0.0–1.2)
BUN/Creatinine Ratio: 19 (ref 9–23)
BUN: 16 mg/dL (ref 6–24)
CHLORIDE: 102 mmol/L (ref 96–106)
CO2: 23 mmol/L (ref 20–29)
CREATININE: 0.85 mg/dL (ref 0.57–1.00)
Calcium: 8.9 mg/dL (ref 8.7–10.2)
GFR calc Af Amer: 94 mL/min/{1.73_m2} (ref >59)
GFR calc non Af Amer: 82 mL/min/{1.73_m2} (ref >59)
GLUCOSE: 89 mg/dL (ref 65–99)
Globulin, Total: 2.6 g/dL (ref 1.5–4.5)
Potassium: 4.7 mmol/L (ref 3.5–5.2)
Sodium: 139 mmol/L (ref 134–144)
TOTAL PROTEIN: 6.7 g/dL (ref 6.0–8.5)

## 2018-05-15 LAB — CBC WITH DIFFERENTIAL
BASOS ABS: 0 10*3/uL (ref 0.0–0.2)
Basos: 0 %
EOS (ABSOLUTE): 0.1 10*3/uL (ref 0.0–0.4)
Eos: 2 %
Hematocrit: 38.8 % (ref 34.0–46.6)
Hemoglobin: 12 g/dL (ref 11.1–15.9)
IMMATURE GRANS (ABS): 0 10*3/uL (ref 0.0–0.1)
Immature Granulocytes: 0 %
LYMPHS: 21 %
Lymphocytes Absolute: 1.8 10*3/uL (ref 0.7–3.1)
MCH: 24.2 pg — AB (ref 26.6–33.0)
MCHC: 30.9 g/dL — AB (ref 31.5–35.7)
MCV: 78 fL — ABNORMAL LOW (ref 79–97)
Monocytes Absolute: 0.4 10*3/uL (ref 0.1–0.9)
Monocytes: 4 %
NEUTROS ABS: 6.3 10*3/uL (ref 1.4–7.0)
NEUTROS PCT: 73 %
RBC: 4.96 x10E6/uL (ref 3.77–5.28)
RDW: 16.7 % — ABNORMAL HIGH (ref 12.3–15.4)
WBC: 8.7 10*3/uL (ref 3.4–10.8)

## 2018-05-15 LAB — LIPID PANEL WITH LDL/HDL RATIO
Cholesterol, Total: 171 mg/dL (ref 100–199)
HDL: 44 mg/dL (ref >39)
LDL Calculated: 98 mg/dL (ref 0–99)
LDl/HDL Ratio: 2.2 ratio (ref 0.0–3.2)
Triglycerides: 145 mg/dL (ref 0–149)
VLDL Cholesterol Cal: 29 mg/dL (ref 5–40)

## 2018-05-15 LAB — HEMOGLOBIN A1C
ESTIMATED AVERAGE GLUCOSE: 120 mg/dL
Hgb A1c MFr Bld: 5.8 % — ABNORMAL HIGH (ref 4.8–5.6)

## 2018-05-15 LAB — T4, FREE: Free T4: 1.47 ng/dL (ref 0.82–1.77)

## 2018-05-15 LAB — INSULIN, RANDOM: INSULIN: 18.3 u[IU]/mL (ref 2.6–24.9)

## 2018-05-15 LAB — FOLATE: Folate: 8.3 ng/mL (ref >3.0)

## 2018-05-15 LAB — VITAMIN D 25 HYDROXY (VIT D DEFICIENCY, FRACTURES): Vit D, 25-Hydroxy: 30.5 ng/mL (ref 30.0–100.0)

## 2018-05-15 LAB — VITAMIN B12: VITAMIN B 12: 339 pg/mL (ref 232–1245)

## 2018-05-15 LAB — T3: T3 TOTAL: 136 ng/dL (ref 71–180)

## 2018-05-16 ENCOUNTER — Other Ambulatory Visit: Payer: BLUE CROSS/BLUE SHIELD

## 2018-05-16 ENCOUNTER — Other Ambulatory Visit: Payer: Self-pay | Admitting: Family Medicine

## 2018-05-16 DIAGNOSIS — M5442 Lumbago with sciatica, left side: Secondary | ICD-10-CM

## 2018-05-17 ENCOUNTER — Ambulatory Visit
Admission: RE | Admit: 2018-05-17 | Discharge: 2018-05-17 | Disposition: A | Discharge status: Home / Self Care | Payer: BLUE CROSS/BLUE SHIELD | Source: Ambulatory Visit | Attending: Family Medicine | Admitting: Family Medicine

## 2018-05-17 DIAGNOSIS — M48061 Spinal stenosis, lumbar region without neurogenic claudication: Secondary | ICD-10-CM | POA: Diagnosis not present

## 2018-05-17 DIAGNOSIS — M5442 Lumbago with sciatica, left side: Secondary | ICD-10-CM

## 2018-05-20 DIAGNOSIS — R262 Difficulty in walking, not elsewhere classified: Secondary | ICD-10-CM | POA: Diagnosis not present

## 2018-05-20 DIAGNOSIS — M25552 Pain in left hip: Secondary | ICD-10-CM | POA: Diagnosis not present

## 2018-05-20 DIAGNOSIS — R293 Abnormal posture: Secondary | ICD-10-CM | POA: Diagnosis not present

## 2018-05-20 DIAGNOSIS — M5416 Radiculopathy, lumbar region: Secondary | ICD-10-CM | POA: Diagnosis not present

## 2018-05-22 DIAGNOSIS — R262 Difficulty in walking, not elsewhere classified: Secondary | ICD-10-CM | POA: Diagnosis not present

## 2018-05-22 DIAGNOSIS — R293 Abnormal posture: Secondary | ICD-10-CM | POA: Diagnosis not present

## 2018-05-22 DIAGNOSIS — M5416 Radiculopathy, lumbar region: Secondary | ICD-10-CM | POA: Diagnosis not present

## 2018-05-22 DIAGNOSIS — M25552 Pain in left hip: Secondary | ICD-10-CM | POA: Diagnosis not present

## 2018-05-26 DIAGNOSIS — M25552 Pain in left hip: Secondary | ICD-10-CM | POA: Diagnosis not present

## 2018-05-26 DIAGNOSIS — M5416 Radiculopathy, lumbar region: Secondary | ICD-10-CM | POA: Diagnosis not present

## 2018-05-26 DIAGNOSIS — R293 Abnormal posture: Secondary | ICD-10-CM | POA: Diagnosis not present

## 2018-05-26 DIAGNOSIS — R262 Difficulty in walking, not elsewhere classified: Secondary | ICD-10-CM | POA: Diagnosis not present

## 2018-05-27 DIAGNOSIS — M5126 Other intervertebral disc displacement, lumbar region: Secondary | ICD-10-CM | POA: Diagnosis not present

## 2018-05-28 ENCOUNTER — Ambulatory Visit (INDEPENDENT_AMBULATORY_CARE_PROVIDER_SITE_OTHER): Payer: BLUE CROSS/BLUE SHIELD | Admitting: Family Medicine

## 2018-05-28 VITALS — BP 141/85 | HR 78 | Temp 98.2°F | Ht 67.0 in | Wt 241.0 lb

## 2018-05-28 DIAGNOSIS — Z9189 Other specified personal risk factors, not elsewhere classified: Secondary | ICD-10-CM | POA: Diagnosis not present

## 2018-05-28 DIAGNOSIS — R7303 Prediabetes: Secondary | ICD-10-CM

## 2018-05-28 DIAGNOSIS — Z6837 Body mass index (BMI) 37.0-37.9, adult: Secondary | ICD-10-CM | POA: Diagnosis not present

## 2018-05-28 DIAGNOSIS — E559 Vitamin D deficiency, unspecified: Secondary | ICD-10-CM

## 2018-05-28 MED ORDER — VITAMIN D (ERGOCALCIFEROL) 1.25 MG (50000 UNIT) PO CAPS: 50000.0000 [IU] | ORAL_CAPSULE | ORAL | 0 refills | Status: DC

## 2018-05-28 MED ORDER — METFORMIN HCL 500 MG PO TABS: 500.0000 mg | ORAL_TABLET | Freq: Every day | ORAL | 0 refills | Status: DC

## 2018-05-28 NOTE — Progress Notes (Signed)
Office: [phone]  /  Fax: [phone]   HPI:   Chief Complaint: OBESITY [name] is here to discuss her progress with her obesity treatment plan. She is on the Category 2 plan with modified breakfast and is following her eating plan approximately 90 % of the time. She states she is exercising 0 minutes 0 times per week. [name] tried to follow her category 2 plan and did well overall, but activity was limited due to severe back pain with significant bulging disk and severe nerve compression. Her weight is 241 lb (109.3 kg) today and has had a weight loss of 1 pound over a period of 2 weeks since her last visit. She has lost 1 lb since starting treatment with us.  Vitamin D deficiency (new) [name] has a new diagnosis of vitamin D deficiency. She is not currently taking vit D. [name] admits fatigue and denies nausea, vomiting or muscle weakness.  Pre-Diabetes (new) [name] has a new diagnosis of prediabetes based on her elevated Hgb A1c of 5.8 and was informed this puts her at greater risk of developing diabetes. She is not taking metformin currently and continues to work on diet and exercise to decrease risk of diabetes. She admits polyphagia denies nausea or hypoglycemia.  At risk for diabetes [name] is at higher than average risk for developing diabetes due to her obesity and pre-diabetes. She currently denies polyuria or polydipsia.  ALLERGIES: Allergies  Allergen Reactions  . Contrave [Naltrexone-Bupropion Hcl Er] Other (See Comments)    Vertigo/ dizziness    MEDICATIONS: Current Outpatient Medications on File Prior to Visit  Medication Sig Dispense Refill  . buPROPion (WELLBUTRIN XL) 300 MG 24 hr tablet Take 300 mg by mouth daily.    Marland Kitchen. HYDROcodone-acetaminophen (NORCO) 7.5-325 MG tablet Take 2 tablets by mouth every 6 (six) hours as needed for moderate pain (left side sciatica).     Marland Kitchen. levothyroxine (SYNTHROID, LEVOTHROID) 75 MCG tablet Take 75 mcg by mouth daily before  breakfast.    . lidocaine (LIDODERM) 5 % Place 1 patch onto the skin daily. Remove & Discard patch within 12 hours or as directed by MD    . naproxen (NAPROSYN) 500 MG tablet Take 500 mg by mouth 2 (two) times daily with a meal.    . Norethindrone Acetate-Ethinyl Estrad-FE (BLISOVI 24 FE) 1-20 MG-MCG(24) tablet Take 1 tablet by mouth daily.    Marland Kitchen. omeprazole (PRILOSEC) 40 MG capsule Take 40 mg by mouth daily.    . valsartan (DIOVAN) 160 MG tablet Take 160 mg by mouth daily.    Marland Kitchen. zolpidem (AMBIEN) 10 MG tablet Take 10 mg by mouth at bedtime as needed for sleep.     No current facility-administered medications on file prior to visit.     PAST MEDICAL HISTORY: Past Medical History:  Diagnosis Date  . Back pain   . Dyspnea   . Elevated blood pressure reading without diagnosis of hypertension   . Esophageal ulcer   . GERD (gastroesophageal reflux disease)   . Hot flashes   . Hypothyroid   . Joint pain   . Sciatica, left side   . Vitamin B 12 deficiency   . Vitamin D deficiency     PAST SURGICAL HISTORY: Past Surgical History:  Procedure Laterality Date  . DILATION AND CURETTAGE OF UTERUS     2011   . OVARIAN CYST SURGERY     2009, 2013    SOCIAL HISTORY: Social History   Tobacco Use  . Smoking status:  Never Smoker  . Smokeless tobacco: Never Used  Substance Use Topics  . Alcohol use: Not on file  . Drug use: Not on file    FAMILY HISTORY: Family History  Problem Relation Age of Onset  . Alcoholism Mother   . Eating disorder Mother   . Diabetes Father   . Hypertension Father   . Sleep apnea Father     ROS: Review of Systems  Constitutional: Positive for malaise/fatigue and weight loss.  Gastrointestinal: Negative for nausea and vomiting.  Genitourinary: Negative for frequency.  Musculoskeletal:       Negative for muscle weakness  Endo/Heme/Allergies: Negative for polydipsia.       Positive for polyphagia Negative for hypoglycemia    PHYSICAL EXAM: Blood  pressure (!) 141/85, pulse 78, temperature 98.2 F (36.8 C), temperature source Oral, height 5\' 7"  (1.702 m), weight 241 lb (109.3 kg), last menstrual period 05/10/2018, SpO2 100 %. Body mass index is 37.75 kg/m. Physical Exam  Constitutional: She is oriented to person, place, and time. She appears well-developed and well-nourished.  Cardiovascular: Normal rate.  Pulmonary/Chest: Effort normal.  Musculoskeletal: Normal range of motion.  Neurological: She is oriented to person, place, and time.  Skin: Skin is warm and dry.  Psychiatric: She has a normal mood and affect. Her behavior is normal.  Vitals reviewed.   RECENT LABS AND TESTS: BMET    Component Value Date/Time   NA 139 05/14/2018 1138   K 4.7 05/14/2018 1138   CL 102 05/14/2018 1138   CO2 23 05/14/2018 1138   GLUCOSE 89 05/14/2018 1138   BUN 16 05/14/2018 1138   CREATININE 0.85 05/14/2018 1138   CALCIUM 8.9 05/14/2018 1138   GFRNONAA 82 05/14/2018 1138   GFRAA 94 05/14/2018 1138   Lab Results  Component Value Date   HGBA1C 5.8 (H) 05/14/2018   Lab Results  Component Value Date   INSULIN 18.3 05/14/2018   CBC    Component Value Date/Time   WBC 8.7 05/14/2018 1138   WBC 13.7 (H) 09/09/2007 0546   RBC 4.96 05/14/2018 1138   RBC 3.11 (L) 09/09/2007 0546   HGB 12.0 05/14/2018 1138   HCT 38.8 05/14/2018 1138   PLT 197 09/09/2007 0546   MCV 78 (L) 05/14/2018 1138   MCH 24.2 (L) 05/14/2018 1138   MCHC 30.9 (L) 05/14/2018 1138   MCHC 33.6 09/09/2007 0546   RDW 16.7 (H) 05/14/2018 1138   LYMPHSABS 1.8 05/14/2018 1138   EOSABS 0.1 05/14/2018 1138   BASOSABS 0.0 05/14/2018 1138   Iron/TIBC/Ferritin/ %Sat No results found for: IRON, TIBC, FERRITIN, IRONPCTSAT Lipid Panel     Component Value Date/Time   CHOL 171 05/14/2018 1138   TRIG 145 05/14/2018 1138   HDL 44 05/14/2018 1138   LDLCALC 98 05/14/2018 1138   Hepatic Function Panel     Component Value Date/Time   PROT 6.7 05/14/2018 1138   ALBUMIN 4.1  05/14/2018 1138   AST 13 05/14/2018 1138   ALT 9 05/14/2018 1138   ALKPHOS 76 05/14/2018 1138   BILITOT 0.2 05/14/2018 1138      Component Value Date/Time   TSH 2.830 05/14/2018 1138   Results for JOLIENE, SALVADOR (MRN [id number]) as of 05/28/2018 17:35  Ref. Range 05/14/2018 11:38  Vitamin D, 25-Hydroxy Latest Ref Range: 30.0 - 100.0 ng/mL 30.5   ASSESSMENT AND PLAN: Vitamin D deficiency - Plan: Vitamin D, Ergocalciferol, (DRISDOL) 50000 units CAPS capsule  Prediabetes - Plan: metFORMIN (GLUCOPHAGE) 500 MG tablet  At risk for diabetes mellitus  Class 2 severe obesity with serious comorbidity and body mass index (BMI) of 37.0 to 37.9 in adult, unspecified obesity type (HCC)  PLAN:  Vitamin D Deficiency (new) Haylie was informed that low vitamin D levels contributes to fatigue and are associated with obesity, breast, and colon cancer. She agrees to start prescription Vit D @50 ,000 IU every week #4 with no refills and will follow up for routine testing of vitamin D, at least 2-3 times per year. She was informed of the risk of over-replacement of vitamin D and agrees to not increase her dose unless she discusses this with [name] first. Kai agrees to follow up as directed.  Pre-Diabetes (new) Gaynel will continue to work on weight loss, exercise, and decreasing simple carbohydrates in her diet to help decrease the risk of diabetes. We dicussed metformin including benefits and risks. She was informed that eating too many simple carbohydrates or too many calories at one sitting increases the likelihood of GI side effects. Vidalia agrees to start metformin 500 mg qAM #30 with no refills. Earla agreed to follow up with [name] as directed to monitor her progress.  Diabetes risk counseling Donnice was given extended (30 minutes) diabetes prevention counseling today. She is 47 y.o. female and has risk factors for diabetes including obesity and pre-diabetes. We discussed intensive lifestyle  modifications today with an emphasis on weight loss as well as increasing exercise and decreasing simple carbohydrates in her diet.  Obesity Ryanna is currently in the action stage of change. As such, her goal is to continue with weight loss efforts She has agreed to follow the Category 2 plan with modified breakfast Jessicalynn has been instructed to work up to a goal of 150 minutes of combined cardio and strengthening exercise per week for weight loss and overall health benefits. We discussed the following Behavioral Modification Strategies today: increasing lean protein intake, decreasing simple carbohydrates  and work on meal planning and easy cooking plans  Noralyn has agreed to follow up with our clinic in 2 to 3 weeks. She was informed of the importance of frequent follow up visits to maximize her success with intensive lifestyle modifications for her multiple health conditions.   OBESITY BEHAVIORAL INTERVENTION VISIT  Today's visit was # 2 out of 22.  Starting weight: 242 lbs Starting date: 05/14/18 Today's weight : 241 lbs  Today's date: 05/28/2018 Total lbs lost to date: 1 (Patients must lose 7 lbs in the first 6 months to continue with counseling)   ASK: We discussed the diagnosis of obesity with Gevena Barre today and Baleria agreed to give [name] permission to discuss obesity behavioral modification therapy today.  ASSESS: [place] has the diagnosis of obesity and her BMI today is 37.74 Chantrell is in the action stage of change   ADVISE: Kasidi was educated on the multiple health risks of obesity as well as the benefit of weight loss to improve her health. She was advised of the need for long term treatment and the importance of lifestyle modifications.  AGREE: Multiple dietary modification options and treatment options were discussed and  Aine agreed to the above obesity treatment plan.  I, [place] Crane, am acting as transcriptionist for Quillian Quince, MD  I have  reviewed the above documentation for accuracy and completeness, and I agree with the above. -Quillian Quince, MD

## 2018-06-04 DIAGNOSIS — R262 Difficulty in walking, not elsewhere classified: Secondary | ICD-10-CM | POA: Diagnosis not present

## 2018-06-04 DIAGNOSIS — M25552 Pain in left hip: Secondary | ICD-10-CM | POA: Diagnosis not present

## 2018-06-04 DIAGNOSIS — M5416 Radiculopathy, lumbar region: Secondary | ICD-10-CM | POA: Diagnosis not present

## 2018-06-04 DIAGNOSIS — R293 Abnormal posture: Secondary | ICD-10-CM | POA: Diagnosis not present

## 2018-06-09 DIAGNOSIS — R262 Difficulty in walking, not elsewhere classified: Secondary | ICD-10-CM | POA: Diagnosis not present

## 2018-06-09 DIAGNOSIS — M25552 Pain in left hip: Secondary | ICD-10-CM | POA: Diagnosis not present

## 2018-06-09 DIAGNOSIS — M5416 Radiculopathy, lumbar region: Secondary | ICD-10-CM | POA: Diagnosis not present

## 2018-06-09 DIAGNOSIS — R293 Abnormal posture: Secondary | ICD-10-CM | POA: Diagnosis not present

## 2018-06-12 ENCOUNTER — Ambulatory Visit (INDEPENDENT_AMBULATORY_CARE_PROVIDER_SITE_OTHER): Payer: BLUE CROSS/BLUE SHIELD | Admitting: Family Medicine

## 2018-06-12 VITALS — BP 120/77 | HR 88 | Temp 98.6°F | Ht 67.0 in | Wt 239.0 lb

## 2018-06-12 DIAGNOSIS — Z6837 Body mass index (BMI) 37.0-37.9, adult: Secondary | ICD-10-CM | POA: Diagnosis not present

## 2018-06-12 DIAGNOSIS — Z9189 Other specified personal risk factors, not elsewhere classified: Secondary | ICD-10-CM

## 2018-06-12 DIAGNOSIS — E559 Vitamin D deficiency, unspecified: Secondary | ICD-10-CM | POA: Diagnosis not present

## 2018-06-12 DIAGNOSIS — R7303 Prediabetes: Secondary | ICD-10-CM

## 2018-06-12 MED ORDER — VITAMIN D (ERGOCALCIFEROL) 1.25 MG (50000 UNIT) PO CAPS: 50000.0000 [IU] | ORAL_CAPSULE | ORAL | 0 refills | Status: DC

## 2018-06-16 NOTE — Progress Notes (Signed)
Office: [phone]  /  Fax: [phone]   HPI:   Chief Complaint: OBESITY Katelyn Walker is here to discuss her progress with her obesity treatment plan. She is on the Category 2 plan with modified breakfast and is following her eating plan approximately 75 % of the time. She states she is exercising 0 minutes 0 times per week. Katelyn Walker did well with weight loss on Category 2 plan. She was not able to plan and brought snacks during travel. Reports decreased hunger in the evening and therefore not eating protein some nights.  Her weight is 239 lb (108.4 kg) today and has had a weight loss of 2 pounds over a period of 2 weeks since her last visit. She has lost 3 lbs since starting treatment with [name].  Vitamin D Deficiency Katelyn Walker has a diagnosis of vitamin D deficiency. She is currently on prescription Vit D, level not at goal. She denies nausea, vomiting or muscle weakness.  Pre-Diabetes Katelyn Walker has a diagnosis of pre-diabetes based on her elevated Hgb A1c and was informed this puts her at greater risk of developing diabetes. She is on metformin and notes hypoglycemia and denies nausea, vomiting, or diarrhea. A1c not yet at goal and she continues to work on diet and exercise to decrease risk of diabetes.  At risk for diabetes Katelyn Walker is at higher than average risk for developing diabetes due to her obesity and pre-diabetes. She currently denies polyuria or polydipsia.  ALLERGIES: Allergies  Allergen Reactions  . Contrave [Naltrexone-Bupropion Hcl Er] Other (See Comments)    Vertigo/ dizziness    MEDICATIONS: Current Outpatient Medications on File Prior to Visit  Medication Sig Dispense Refill  . buPROPion (WELLBUTRIN XL) 300 MG 24 hr tablet Take 300 mg by mouth daily.    Marland Kitchen HYDROcodone-acetaminophen (NORCO) 7.5-325 MG tablet Take 2 tablets by mouth every 6 (six) hours as needed for moderate pain (left side sciatica).     Marland Kitchen levothyroxine (SYNTHROID, LEVOTHROID) 75 MCG tablet Take 75 mcg by  mouth daily before breakfast.    . metFORMIN (GLUCOPHAGE) 500 MG tablet Take 1 tablet (500 mg total) by mouth daily with breakfast. 30 tablet 0  . naproxen (NAPROSYN) 500 MG tablet Take 500 mg by mouth 2 (two) times daily with a meal.    . Norethindrone Acetate-Ethinyl Estrad-FE (BLISOVI 24 FE) 1-20 MG-MCG(24) tablet Take 1 tablet by mouth daily.    Marland Kitchen omeprazole (PRILOSEC) 40 MG capsule Take 40 mg by mouth daily.    . predniSONE (STERAPRED UNI-PAK 21 TAB) 10 MG (21) TBPK tablet Take by mouth daily.    . traMADol (ULTRAM) 50 MG tablet Take by mouth every 6 (six) hours as needed.    . valsartan (DIOVAN) 160 MG tablet Take 160 mg by mouth daily.    Marland Kitchen zolpidem (AMBIEN) 10 MG tablet Take 10 mg by mouth at bedtime as needed for sleep.     No current facility-administered medications on file prior to visit.     PAST MEDICAL HISTORY: Past Medical History:  Diagnosis Date  . Back pain   . Dyspnea   . Elevated blood pressure reading without diagnosis of hypertension   . Esophageal ulcer   . GERD (gastroesophageal reflux disease)   . Hot flashes   . Hypothyroid   . Joint pain   . Sciatica, left side   . Vitamin B 12 deficiency   . Vitamin D deficiency     PAST SURGICAL HISTORY: Past Surgical History:  Procedure Laterality Date  .  DILATION AND CURETTAGE OF UTERUS     2011   . OVARIAN CYST SURGERY     2009, 2013    SOCIAL HISTORY: Social History   Tobacco Use  . Smoking status: Never Smoker  . Smokeless tobacco: Never Used  Substance Use Topics  . Alcohol use: Not on file  . Drug use: Not on file    FAMILY HISTORY: Family History  Problem Relation Age of Onset  . Alcoholism Mother   . Eating disorder Mother   . Diabetes Father   . Hypertension Father   . Sleep apnea Father     ROS: Review of Systems  Constitutional: Positive for weight loss.  Gastrointestinal: Negative for diarrhea, nausea and vomiting.  Genitourinary: Negative for frequency.  Musculoskeletal:        Negative muscle weakness  Endo/Heme/Allergies: Negative for polydipsia.       Positive hypoglycemia    PHYSICAL EXAM: Blood pressure 120/77, pulse 88, temperature 98.6 F (37 C), temperature source Oral, height 5\' 7"  (1.702 m), weight 239 lb (108.4 kg), SpO2 98 %. Body mass index is 37.43 kg/m. Physical Exam  Constitutional: She is oriented to person, place, and time. She appears well-developed and well-nourished.  Cardiovascular: Normal rate.  Pulmonary/Chest: Effort normal.  Musculoskeletal: Normal range of motion.  Neurological: She is oriented to person, place, and time.  Skin: Skin is warm and dry.  Psychiatric: She has a normal mood and affect. Her behavior is normal.  Vitals reviewed.   RECENT LABS AND TESTS: BMET    Component Value Date/Time   NA 139 05/14/2018 1138   K 4.7 05/14/2018 1138   CL 102 05/14/2018 1138   CO2 23 05/14/2018 1138   GLUCOSE 89 05/14/2018 1138   BUN 16 05/14/2018 1138   CREATININE 0.85 05/14/2018 1138   CALCIUM 8.9 05/14/2018 1138   GFRNONAA 82 05/14/2018 1138   GFRAA 94 05/14/2018 1138   Lab Results  Component Value Date   HGBA1C 5.8 (H) 05/14/2018   Lab Results  Component Value Date   INSULIN 18.3 05/14/2018   CBC    Component Value Date/Time   WBC 8.7 05/14/2018 1138   WBC 13.7 (H) 09/09/2007 0546   RBC 4.96 05/14/2018 1138   RBC 3.11 (L) 09/09/2007 0546   HGB 12.0 05/14/2018 1138   HCT 38.8 05/14/2018 1138   PLT 197 09/09/2007 0546   MCV 78 (L) 05/14/2018 1138   MCH 24.2 (L) 05/14/2018 1138   MCHC 30.9 (L) 05/14/2018 1138   MCHC 33.6 09/09/2007 0546   RDW 16.7 (H) 05/14/2018 1138   LYMPHSABS 1.8 05/14/2018 1138   EOSABS 0.1 05/14/2018 1138   BASOSABS 0.0 05/14/2018 1138   Iron/TIBC/Ferritin/ %Sat No results found for: IRON, TIBC, FERRITIN, IRONPCTSAT Lipid Panel     Component Value Date/Time   CHOL 171 05/14/2018 1138   TRIG 145 05/14/2018 1138   HDL 44 05/14/2018 1138   LDLCALC 98 05/14/2018 1138   Hepatic  Function Panel     Component Value Date/Time   PROT 6.7 05/14/2018 1138   ALBUMIN 4.1 05/14/2018 1138   AST 13 05/14/2018 1138   ALT 9 05/14/2018 1138   ALKPHOS 76 05/14/2018 1138   BILITOT 0.2 05/14/2018 1138      Component Value Date/Time   TSH 2.830 05/14/2018 1138  Results for Katelyn Walker, Katelyn Walker (MRN [id number]) as of 06/16/2018 10:51  Ref. Range 05/14/2018 11:38  Vitamin D, 25-Hydroxy Latest Ref Range: 30.0 - 100.0 ng/mL 30.5  ASSESSMENT AND PLAN: Vitamin D deficiency - Plan: Vitamin D, Ergocalciferol, (DRISDOL) 50000 units CAPS capsule  Prediabetes  At risk for diabetes mellitus  Class 2 severe obesity with serious comorbidity and body mass index (BMI) of 37.0 to 37.9 in adult, unspecified obesity type (HCC)  PLAN:  Vitamin D Deficiency Katelyn Walker was informed that low vitamin D levels contributes to fatigue and are associated with obesity, breast, and colon cancer. Katelyn Walker agrees to continue taking prescription Vit D @50 ,000 IU every week #4 and we will refill for 1 month. She will follow up for routine testing of vitamin D, at least 2-3 times per year. She was informed of the risk of over-replacement of vitamin D and agrees to not increase her dose unless she discusses this with [name] first. Katelyn Walker agrees to follow up with our clinic in 2 to 3 weeks.  Pre-Diabetes Katelyn Walker will continue to work on weight loss, exercise, and decreasing simple carbohydrates in her diet to help decrease the risk of diabetes. We dicussed metformin including benefits and risks. She was informed that eating too many simple carbohydrates or too many calories at one sitting increases the likelihood of GI side effects. Katelyn Walker agrees to continue taking metformin and we will recheck A1c in 3 months. Katelyn Walker agrees to follow up with our clinic in 2 to 3 weeks as directed to monitor her progress.  Diabetes risk counselling Katelyn Walker was given extended (15 minutes) diabetes prevention counseling today. She is  47 y.o. female and has risk factors for diabetes including obesity and pre-diabetes. We discussed intensive lifestyle modifications today with an emphasis on weight loss as well as increasing exercise and decreasing simple carbohydrates in her diet.  Obesity Katelyn Walker is currently in the action stage of change. As such, her goal is to continue with weight loss efforts She has agreed to follow the Category 2 plan with modified breakfast Katelyn Walker has been instructed to work up to a goal of 150 minutes of combined cardio and strengthening exercise per week for weight loss and overall health benefits. We discussed the following Behavioral Modification Strategies today: increasing lean protein intake, work on meal planning and easy cooking plans, travel eating strategies, and no skipping meals  We discussed splitting dinner into snacks during the day to ensure eating all food on the meal plan.  Katelyn Walker has agreed to follow up with our clinic in 2 to 3 weeks. She was informed of the importance of frequent follow up visits to maximize her success with intensive lifestyle modifications for her multiple health conditions.   OBESITY BEHAVIORAL INTERVENTION VISIT  Today's visit was # 3 out of 22.  Starting weight: 242 lbs Starting date: 05/14/18 Today's weight : 239 lbs Today's date: 06/12/2018 Total lbs lost to date: 3 (Patients must lose 7 lbs in the first 6 months to continue with counseling)   ASK: We discussed the diagnosis of obesity with Katelyn Walker today and Maudine agreed to give [name] permission to discuss obesity behavioral modification therapy today.  ASSESS: Katelyn Walker has the diagnosis of obesity and her BMI today is 37.42 Katelyn Walker is in the action stage of change   ADVISE: Katelyn Walker was educated on the multiple health risks of obesity as well as the benefit of weight loss to improve her health. She was advised of the need for long term treatment and the importance of lifestyle  modifications.  AGREE: Multiple dietary modification options and treatment options were discussed and  Katelyn Walker agreed to the above obesity treatment plan.  I, Trixie Dredge, am acting as transcriptionist for Dennard Nip, MD  I have reviewed the above documentation for accuracy and completeness, and I agree with the above. -Dennard Nip, MD

## 2018-06-17 DIAGNOSIS — M25552 Pain in left hip: Secondary | ICD-10-CM | POA: Diagnosis not present

## 2018-06-17 DIAGNOSIS — R262 Difficulty in walking, not elsewhere classified: Secondary | ICD-10-CM | POA: Diagnosis not present

## 2018-06-17 DIAGNOSIS — M5416 Radiculopathy, lumbar region: Secondary | ICD-10-CM | POA: Diagnosis not present

## 2018-06-17 DIAGNOSIS — R293 Abnormal posture: Secondary | ICD-10-CM | POA: Diagnosis not present

## 2018-06-21 ENCOUNTER — Other Ambulatory Visit (INDEPENDENT_AMBULATORY_CARE_PROVIDER_SITE_OTHER): Payer: Self-pay | Admitting: Family Medicine

## 2018-06-21 DIAGNOSIS — R7303 Prediabetes: Secondary | ICD-10-CM

## 2018-06-23 DIAGNOSIS — R262 Difficulty in walking, not elsewhere classified: Secondary | ICD-10-CM | POA: Diagnosis not present

## 2018-06-23 DIAGNOSIS — M5416 Radiculopathy, lumbar region: Secondary | ICD-10-CM | POA: Diagnosis not present

## 2018-06-23 DIAGNOSIS — M25552 Pain in left hip: Secondary | ICD-10-CM | POA: Diagnosis not present

## 2018-06-23 DIAGNOSIS — R293 Abnormal posture: Secondary | ICD-10-CM | POA: Diagnosis not present

## 2018-06-26 ENCOUNTER — Other Ambulatory Visit (INDEPENDENT_AMBULATORY_CARE_PROVIDER_SITE_OTHER): Payer: Self-pay | Admitting: Family Medicine

## 2018-06-26 DIAGNOSIS — R7303 Prediabetes: Secondary | ICD-10-CM

## 2018-06-30 DIAGNOSIS — M5416 Radiculopathy, lumbar region: Secondary | ICD-10-CM | POA: Diagnosis not present

## 2018-06-30 DIAGNOSIS — M25552 Pain in left hip: Secondary | ICD-10-CM | POA: Diagnosis not present

## 2018-06-30 DIAGNOSIS — R293 Abnormal posture: Secondary | ICD-10-CM | POA: Diagnosis not present

## 2018-06-30 DIAGNOSIS — R262 Difficulty in walking, not elsewhere classified: Secondary | ICD-10-CM | POA: Diagnosis not present

## 2018-07-03 ENCOUNTER — Ambulatory Visit (INDEPENDENT_AMBULATORY_CARE_PROVIDER_SITE_OTHER): Payer: BLUE CROSS/BLUE SHIELD | Admitting: Family Medicine

## 2018-07-03 VITALS — BP 122/80 | HR 78 | Temp 98.7°F | Ht 67.0 in | Wt 238.0 lb

## 2018-07-03 DIAGNOSIS — E559 Vitamin D deficiency, unspecified: Secondary | ICD-10-CM | POA: Diagnosis not present

## 2018-07-03 DIAGNOSIS — Z9189 Other specified personal risk factors, not elsewhere classified: Secondary | ICD-10-CM

## 2018-07-03 DIAGNOSIS — R7303 Prediabetes: Secondary | ICD-10-CM

## 2018-07-03 DIAGNOSIS — Z6837 Body mass index (BMI) 37.0-37.9, adult: Secondary | ICD-10-CM

## 2018-07-03 MED ORDER — METFORMIN HCL 500 MG PO TABS: 500.0000 mg | ORAL_TABLET | Freq: Every day | ORAL | 0 refills | Status: DC

## 2018-07-03 MED ORDER — VITAMIN D (ERGOCALCIFEROL) 1.25 MG (50000 UNIT) PO CAPS: 50000.0000 [IU] | ORAL_CAPSULE | ORAL | 0 refills | Status: DC

## 2018-07-07 NOTE — Progress Notes (Signed)
Office: [phone]  /  Fax: [phone]   HPI:   Chief Complaint: OBESITY Katelyn Walker is here to discuss her progress with her obesity treatment plan. She is on the Category 2 plan with modified breakfast and is following her eating plan approximately 40 % of the time. She states she is exercising 0 minutes 0 times per week. Katelyn Walker had a steroid burst for back pain and has a trip to [facility] for [employer] championship in 1 week. She has back surgery scheduled for the end of July. Her weight is 238 lb (108 kg) today and has had a weight loss of 1 pound over a period of 3 weeks since her last visit. She has lost 4 lbs since starting treatment with [name].  Vitamin D deficiency Katelyn Walker has a diagnosis of vitamin D deficiency. She is currently taking vit D and admits fatigue, but denies nausea, vomiting or muscle weakness.  Pre-Diabetes Katelyn Walker has a diagnosis of prediabetes based on her elevated Hgb A1c and was informed this puts her at greater risk of developing diabetes. She has no GI side effects of metformin. Katelyn Walker continues to work on diet and exercise to decrease risk of diabetes. She admits carb cravings and denies hypoglycemia.  At risk for diabetes Katelyn Walker is at higher than average risk for developing diabetes due to her obesity and pre-diabetes. She currently denies polyuria or polydipsia.  ALLERGIES: Allergies  Allergen Reactions  . Contrave [Naltrexone-Bupropion Hcl Er] Other (See Comments)    Vertigo/ dizziness    MEDICATIONS: Current Outpatient Medications on File Prior to Visit  Medication Sig Dispense Refill  . buPROPion (WELLBUTRIN XL) 300 MG 24 hr tablet Take 300 mg by mouth daily.    Marland Kitchen HYDROcodone-acetaminophen (NORCO) 7.5-325 MG tablet Take 2 tablets by mouth every 6 (six) hours as needed for moderate pain (left side sciatica).     Marland Kitchen levothyroxine (SYNTHROID, LEVOTHROID) 75 MCG tablet Take 75 mcg by mouth daily before breakfast.    . naproxen  (NAPROSYN) 500 MG tablet Take 500 mg by mouth 2 (two) times daily with a meal.    . Norethindrone Acetate-Ethinyl Estrad-FE (BLISOVI 24 FE) 1-20 MG-MCG(24) tablet Take 1 tablet by mouth daily.    Marland Kitchen omeprazole (PRILOSEC) 40 MG capsule Take 40 mg by mouth daily.    Marland Kitchen oxycodone (OXY-IR) 5 MG capsule Take 10 mg by mouth every 4 (four) hours as needed.    . predniSONE (STERAPRED UNI-PAK 21 TAB) 10 MG (21) TBPK tablet Take by mouth daily.    . traMADol (ULTRAM) 50 MG tablet Take by mouth every 6 (six) hours as needed.    . valsartan (DIOVAN) 160 MG tablet Take 160 mg by mouth daily.    Marland Kitchen zolpidem (AMBIEN) 10 MG tablet Take 10 mg by mouth at bedtime as needed for sleep.     No current facility-administered medications on file prior to visit.     PAST MEDICAL HISTORY: Past Medical History:  Diagnosis Date  . Back pain   . Dyspnea   . Elevated blood pressure reading without diagnosis of hypertension   . Esophageal ulcer   . GERD (gastroesophageal reflux disease)   . Hot flashes   . Hypothyroid   . Joint pain   . Sciatica, left side   . Vitamin B 12 deficiency   . Vitamin D deficiency     PAST SURGICAL HISTORY: Past Surgical History:  Procedure Laterality Date  . DILATION AND CURETTAGE OF UTERUS  2011   . OVARIAN CYST SURGERY     2009, 2013    SOCIAL HISTORY: Social History   Tobacco Use  . Smoking status: Never Smoker  . Smokeless tobacco: Never Used  Substance Use Topics  . Alcohol use: Not on file  . Drug use: Not on file    FAMILY HISTORY: Family History  Problem Relation Age of Onset  . Alcoholism Mother   . Eating disorder Mother   . Diabetes Father   . Hypertension Father   . Sleep apnea Father     ROS: Review of Systems  Constitutional: Positive for malaise/fatigue and weight loss.  Gastrointestinal: Negative for diarrhea, nausea and vomiting.  Genitourinary: Negative for frequency.  Musculoskeletal:       Negative for muscle weakness    Endo/Heme/Allergies: Negative for polydipsia.       Negative for hypoglycemia Positive for carb cravings    PHYSICAL EXAM: Blood pressure 122/80, pulse 78, temperature 98.7 F (37.1 C), temperature source Oral, height 5\' 7"  (1.702 m), weight 238 lb (108 kg), SpO2 97 %. Body mass index is 37.28 kg/m. Physical Exam  Constitutional: She is oriented to person, place, and time. She appears well-developed and well-nourished.  Cardiovascular: Normal rate.  Pulmonary/Chest: Effort normal.  Musculoskeletal: Normal range of motion.  Neurological: She is oriented to person, place, and time.  Skin: Skin is warm and dry.  Psychiatric: She has a normal mood and affect. Her behavior is normal.  Vitals reviewed.   RECENT LABS AND TESTS: BMET    Component Value Date/Time   NA 139 05/14/2018 1138   K 4.7 05/14/2018 1138   CL 102 05/14/2018 1138   CO2 23 05/14/2018 1138   GLUCOSE 89 05/14/2018 1138   BUN 16 05/14/2018 1138   CREATININE 0.85 05/14/2018 1138   CALCIUM 8.9 05/14/2018 1138   GFRNONAA 82 05/14/2018 1138   GFRAA 94 05/14/2018 1138   Lab Results  Component Value Date   HGBA1C 5.8 (H) 05/14/2018   Lab Results  Component Value Date   INSULIN 18.3 05/14/2018   CBC    Component Value Date/Time   WBC 8.7 05/14/2018 1138   WBC 13.7 (H) 09/09/2007 0546   RBC 4.96 05/14/2018 1138   RBC 3.11 (L) 09/09/2007 0546   HGB 12.0 05/14/2018 1138   HCT 38.8 05/14/2018 1138   PLT 197 09/09/2007 0546   MCV 78 (L) 05/14/2018 1138   MCH 24.2 (L) 05/14/2018 1138   MCHC 30.9 (L) 05/14/2018 1138   MCHC 33.6 09/09/2007 0546   RDW 16.7 (H) 05/14/2018 1138   LYMPHSABS 1.8 05/14/2018 1138   EOSABS 0.1 05/14/2018 1138   BASOSABS 0.0 05/14/2018 1138   Iron/TIBC/Ferritin/ %Sat No results found for: IRON, TIBC, FERRITIN, IRONPCTSAT Lipid Panel     Component Value Date/Time   CHOL 171 05/14/2018 1138   TRIG 145 05/14/2018 1138   HDL 44 05/14/2018 1138   LDLCALC 98 05/14/2018 1138    Hepatic Function Panel     Component Value Date/Time   PROT 6.7 05/14/2018 1138   ALBUMIN 4.1 05/14/2018 1138   AST 13 05/14/2018 1138   ALT 9 05/14/2018 1138   ALKPHOS 76 05/14/2018 1138   BILITOT 0.2 05/14/2018 1138      Component Value Date/Time   TSH 2.830 05/14/2018 1138   Results for Katelyn BarreCRIHFIELD, [place] G (MRN [id number]) as of 07/07/2018 09:50  Ref. Range 05/14/2018 11:38  Vitamin D, 25-Hydroxy Latest Ref Range: 30.0 - 100.0 ng/mL 30.5  ASSESSMENT AND PLAN: Vitamin D deficiency - Plan: Vitamin D, Ergocalciferol, (DRISDOL) 50000 units CAPS capsule  Prediabetes - Plan: metFORMIN (GLUCOPHAGE) 500 MG tablet  At risk for diabetes mellitus  Class 2 severe obesity with serious comorbidity and body mass index (BMI) of 37.0 to 37.9 in adult, unspecified obesity type (HCC)  PLAN:  Vitamin D Deficiency Kang was informed that low vitamin D levels contributes to fatigue and are associated with obesity, breast, and colon cancer. She agrees to continue to take prescription Vit D @50 ,000 IU every week #4 with no refills and will follow up for routine testing of vitamin D, at least 2-3 times per year. She was informed of the risk of over-replacement of vitamin D and agrees to not increase her dose unless she discusses this with [name] first. Katelyn Walker agrees to follow up as directed.  Pre-Diabetes Katelyn Walker will continue to work on weight loss, exercise, and decreasing simple carbohydrates in her diet to help decrease the risk of diabetes. We dicussed metformin including benefits and risks. She was informed that eating too many simple carbohydrates or too many calories at one sitting increases the likelihood of GI side effects. Katelyn Walker requested metformin for now and a prescription was written today for 1 month refill. Katelyn Walker Walker to follow up with [name] as directed to monitor her progress.  Diabetes risk counseling Katelyn Walker was given extended (15 minutes) diabetes prevention counseling today.  She is 47 y.o. female and has risk factors for diabetes including obesity and pre-diabetes. We discussed intensive lifestyle modifications today with an emphasis on weight loss as well as increasing exercise and decreasing simple carbohydrates in her diet.  Obesity Katelyn Walker is currently in the action stage of change. As such, her goal is to continue with weight loss efforts She has Walker to keep a food journal with 1150 to 1300 calories and 75+ grams of protein daily K[place]slei has been instructed to work up to a goal of 150 minutes of combined cardio and strengthening exercise per week for weight loss and overall health benefits. We discussed the following Behavioral Modification Strategies today: better snacking choices, keep a strict food journal, planning for success, increasing lean protein intake, increasing vegetables and work on meal planning and easy cooking plans  [place] has Walker to follow up with our clinic in 2 weeks. She was informed of the importance of frequent follow up visits to maximize her success with intensive lifestyle modifications for her multiple health conditions.   OBESITY BEHAVIORAL INTERVENTION VISIT  Today's visit was # 4 out of 22.  Starting weight: 242 lbs Starting date: 05/14/18 Today's weight : 238 lbs Today's date: 07/03/2018 Total lbs lost to date: 4    ASK: We discussed the diagnosis of obesity with Katelyn Walker today and Katelyn Walker to give [name] permission to discuss obesity behavioral modification therapy today.  ASSESS: Katelyn Walker has the diagnosis of obesity and her BMI today is 37.27 Katelyn Walker is in the action stage of change   ADVISE: Miguelina was educated on the multiple health risks of obesity as well as the benefit of weight loss to improve her health. She was advised of the need for long term treatment and the importance of lifestyle modifications.  AGREE: Multiple dietary modification options and treatment options were discussed and   Alaina Walker to the above obesity treatment plan.  Cristi Loron, am acting as transcriptionist for Filbert Schilder, MD  I have reviewed the above documentation for accuracy and completeness, and I agree  with the above. - Ilene Qua, MD

## 2018-07-16 DIAGNOSIS — M5126 Other intervertebral disc displacement, lumbar region: Secondary | ICD-10-CM | POA: Diagnosis not present

## 2018-07-16 DIAGNOSIS — M5116 Intervertebral disc disorders with radiculopathy, lumbar region: Secondary | ICD-10-CM | POA: Diagnosis not present

## 2018-07-16 DIAGNOSIS — Z981 Arthrodesis status: Secondary | ICD-10-CM | POA: Diagnosis not present

## 2018-07-18 ENCOUNTER — Encounter (INDEPENDENT_AMBULATORY_CARE_PROVIDER_SITE_OTHER): Payer: Self-pay | Admitting: Family Medicine

## 2018-07-21 NOTE — Telephone Encounter (Signed)
Please address

## 2018-07-24 ENCOUNTER — Ambulatory Visit (INDEPENDENT_AMBULATORY_CARE_PROVIDER_SITE_OTHER): Payer: BLUE CROSS/BLUE SHIELD | Admitting: Family Medicine

## 2018-07-24 DIAGNOSIS — K922 Gastrointestinal hemorrhage, unspecified: Secondary | ICD-10-CM | POA: Diagnosis not present

## 2018-07-25 DIAGNOSIS — K922 Gastrointestinal hemorrhage, unspecified: Secondary | ICD-10-CM | POA: Diagnosis not present

## 2018-07-28 ENCOUNTER — Other Ambulatory Visit (INDEPENDENT_AMBULATORY_CARE_PROVIDER_SITE_OTHER): Payer: Self-pay | Admitting: Family Medicine

## 2018-07-28 DIAGNOSIS — R7303 Prediabetes: Secondary | ICD-10-CM

## 2018-07-28 DIAGNOSIS — K922 Gastrointestinal hemorrhage, unspecified: Secondary | ICD-10-CM | POA: Diagnosis not present

## 2018-07-29 ENCOUNTER — Other Ambulatory Visit (INDEPENDENT_AMBULATORY_CARE_PROVIDER_SITE_OTHER): Payer: Self-pay | Admitting: Family Medicine

## 2018-07-29 DIAGNOSIS — E559 Vitamin D deficiency, unspecified: Secondary | ICD-10-CM

## 2018-07-30 DIAGNOSIS — K625 Hemorrhage of anus and rectum: Secondary | ICD-10-CM | POA: Diagnosis not present

## 2018-08-05 ENCOUNTER — Ambulatory Visit (INDEPENDENT_AMBULATORY_CARE_PROVIDER_SITE_OTHER): Payer: BLUE CROSS/BLUE SHIELD | Admitting: Family Medicine

## 2018-08-05 VITALS — BP 130/87 | HR 91 | Temp 98.7°F | Ht 67.0 in | Wt 237.0 lb

## 2018-08-05 DIAGNOSIS — R7303 Prediabetes: Secondary | ICD-10-CM | POA: Diagnosis not present

## 2018-08-05 DIAGNOSIS — E559 Vitamin D deficiency, unspecified: Secondary | ICD-10-CM

## 2018-08-05 DIAGNOSIS — Z9189 Other specified personal risk factors, not elsewhere classified: Secondary | ICD-10-CM | POA: Diagnosis not present

## 2018-08-05 DIAGNOSIS — Z6837 Body mass index (BMI) 37.0-37.9, adult: Secondary | ICD-10-CM

## 2018-08-05 MED ORDER — METFORMIN HCL 500 MG PO TABS: 500.0000 mg | ORAL_TABLET | Freq: Every day | ORAL | 0 refills | Status: DC

## 2018-08-05 MED ORDER — VITAMIN D (ERGOCALCIFEROL) 1.25 MG (50000 UNIT) PO CAPS: 50000.0000 [IU] | ORAL_CAPSULE | ORAL | 0 refills | Status: DC

## 2018-08-05 NOTE — Progress Notes (Signed)
Office: [phone]  /  Fax: [phone]   HPI:   Chief Complaint: OBESITY [name] is here to discuss her progress with her obesity treatment plan. She is on the keep a food journal with 1150 to 1300 calories and 75+ grams of protein daily plan and is following her eating plan approximately 50 % of the time. She states she is exercising 0 minutes 0 times per week. [name] continues to lose weight on the category 2 plan, even with traveling and having back surgery. Her weight is 237 lb (107.5 kg) today and has had a weight loss of 1 pound over a period of 4 weeks since her last visit. She has lost 5 lbs since starting treatment with us.  Vitamin D deficiency [name] has a diagnosis of vitamin D deficiency. [name] is stable on vit D and she denies nausea, vomiting or muscle weakness.  Pre-Diabetes [name] has a diagnosis of prediabetes based on her elevated Hgb A1c and was informed this puts her at greater risk of developing diabetes. She is stable on metformin and diet. [name] continues to work on diet and exercise to decrease risk of diabetes. She denies nausea, vomiting or hypoglycemia.  At risk for diabetes [name] is at higher than average risk for developing diabetes due to her obesity and prediabetes. She currently denies polyuria or polydipsia.  ALLERGIES: Allergies  Allergen Reactions  . Contrave [Naltrexone-Bupropion Hcl Er] Other (See Comments)    Vertigo/ dizziness    MEDICATIONS: Current Outpatient Medications on File Prior to Visit  Medication Sig Dispense Refill  . buPROPion (WELLBUTRIN XL) 300 MG 24 hr tablet Take 300 mg by mouth daily.    . ferrous sulfate 325 (65 FE) MG tablet Take 325 mg by mouth daily with breakfast.    . levothyroxine (SYNTHROID, LEVOTHROID) 75 MCG tablet Take 75 mcg by mouth daily before breakfast.    . metFORMIN (GLUCOPHAGE) 500 MG tablet Take 1 tablet (500 mg total) by mouth daily with breakfast. 30 tablet 0  . Norethindrone  Acetate-Ethinyl Estrad-FE (BLISOVI 24 FE) 1-20 MG-MCG(24) tablet Take 1 tablet by mouth daily.    Marland Kitchen. omeprazole (PRILOSEC) 40 MG capsule Take 40 mg by mouth daily.    . valsartan (DIOVAN) 160 MG tablet Take 160 mg by mouth daily.    . Vitamin D, Ergocalciferol, (DRISDOL) 50000 units CAPS capsule Take 1 capsule (50,000 Units total) by mouth every 7 (seven) days. 4 capsule 0  . zolpidem (AMBIEN) 10 MG tablet Take 10 mg by mouth at bedtime as needed for sleep.     No current facility-administered medications on file prior to visit.     PAST MEDICAL HISTORY: Past Medical History:  Diagnosis Date  . Back pain   . Dyspnea   . Elevated blood pressure reading without diagnosis of hypertension   . Esophageal ulcer   . GERD (gastroesophageal reflux disease)   . Hot flashes   . Hypothyroid   . Joint pain   . Sciatica, left side   . Vitamin B 12 deficiency   . Vitamin D deficiency     PAST SURGICAL HISTORY: Past Surgical History:  Procedure Laterality Date  . DILATION AND CURETTAGE OF UTERUS     2011   . OVARIAN CYST SURGERY     2009, 2013    SOCIAL HISTORY: Social History   Tobacco Use  . Smoking status: Never Smoker  . Smokeless tobacco: Never Used  Substance Use Topics  . Alcohol use: Not on file  .  Drug use: Not on file    FAMILY HISTORY: Family History  Problem Relation Age of Onset  . Alcoholism Mother   . Eating disorder Mother   . Diabetes Father   . Hypertension Father   . Sleep apnea Father     ROS: Review of Systems  Constitutional: Positive for weight loss.  Gastrointestinal: Negative for nausea and vomiting.  Genitourinary: Negative for frequency.  Musculoskeletal:       Negative for muscle weakness  Endo/Heme/Allergies: Negative for polydipsia.       Negative for hypoglycemia    PHYSICAL EXAM: Blood pressure 130/87, pulse 91, temperature 98.7 F (37.1 C), temperature source Oral, height 5\' 7"  (1.702 m), weight 237 lb (107.5 kg), SpO2 98 %. Body  mass index is 37.12 kg/m. Physical Exam  Constitutional: She is oriented to person, place, and time. She appears well-developed and well-nourished.  Cardiovascular: Normal rate.  Pulmonary/Chest: Effort normal.  Musculoskeletal: Normal range of motion.  Neurological: She is oriented to person, place, and time.  Skin: Skin is warm and dry.  Psychiatric: She has a normal mood and affect. Her behavior is normal.  Vitals reviewed.   RECENT LABS AND TESTS: BMET    Component Value Date/Time   NA 139 05/14/2018 1138   K 4.7 05/14/2018 1138   CL 102 05/14/2018 1138   CO2 23 05/14/2018 1138   GLUCOSE 89 05/14/2018 1138   BUN 16 05/14/2018 1138   CREATININE 0.85 05/14/2018 1138   CALCIUM 8.9 05/14/2018 1138   GFRNONAA 82 05/14/2018 1138   GFRAA 94 05/14/2018 1138   Lab Results  Component Value Date   HGBA1C 5.8 (H) 05/14/2018   Lab Results  Component Value Date   INSULIN 18.3 05/14/2018   CBC    Component Value Date/Time   WBC 8.7 05/14/2018 1138   WBC 13.7 (H) 09/09/2007 0546   RBC 4.96 05/14/2018 1138   RBC 3.11 (L) 09/09/2007 0546   HGB 12.0 05/14/2018 1138   HCT 38.8 05/14/2018 1138   PLT 197 09/09/2007 0546   MCV 78 (L) 05/14/2018 1138   MCH 24.2 (L) 05/14/2018 1138   MCHC 30.9 (L) 05/14/2018 1138   MCHC 33.6 09/09/2007 0546   RDW 16.7 (H) 05/14/2018 1138   LYMPHSABS 1.8 05/14/2018 1138   EOSABS 0.1 05/14/2018 1138   BASOSABS 0.0 05/14/2018 1138   Iron/TIBC/Ferritin/ %Sat No results found for: IRON, TIBC, FERRITIN, IRONPCTSAT Lipid Panel     Component Value Date/Time   CHOL 171 05/14/2018 1138   TRIG 145 05/14/2018 1138   HDL 44 05/14/2018 1138   LDLCALC 98 05/14/2018 1138   Hepatic Function Panel     Component Value Date/Time   PROT 6.7 05/14/2018 1138   ALBUMIN 4.1 05/14/2018 1138   AST 13 05/14/2018 1138   ALT 9 05/14/2018 1138   ALKPHOS 76 05/14/2018 1138   BILITOT 0.2 05/14/2018 1138      Component Value Date/Time   TSH 2.830 05/14/2018  1138   Results for Gevena BarreCRIHFIELD, Alletta G (MRN [id number]) as of 08/05/2018 17:29  Ref. Range 05/14/2018 11:38  Vitamin D, 25-Hydroxy Latest Ref Range: 30.0 - 100.0 ng/mL 30.5   ASSESSMENT AND PLAN: Vitamin D deficiency - Plan: Vitamin D, Ergocalciferol, (DRISDOL) 50000 units CAPS capsule  Prediabetes - Plan: metFORMIN (GLUCOPHAGE) 500 MG tablet  At risk for diabetes mellitus  Class 2 severe obesity with serious comorbidity and body mass index (BMI) of 37.0 to 37.9 in adult, unspecified obesity type (HCC)  PLAN:  Vitamin D Deficiency Kodi was informed that low vitamin D levels contributes to fatigue and are associated with obesity, breast, and colon cancer. She agrees to continue to take prescription Vit D @50 ,000 IU every week #4 with no refills and will follow up for routine testing of vitamin D, at least 2-3 times per year. She was informed of the risk of over-replacement of vitamin D and agrees to not increase her dose unless she discusses this with [name] first. Jassica agrees to follow up as directed.  Pre-Diabetes Nellie will continue to work on weight loss, exercise, and decreasing simple carbohydrates in her diet to help decrease the risk of diabetes. We dicussed metformin including benefits and risks. She was informed that eating too many simple carbohydrates or too many calories at one sitting increases the likelihood of GI side effects. Wanita requested metformin for now and a prescription was written today for 1 month refill. Jasmeen agreed to follow up with [name] as directed to monitor her progress.  Diabetes risk counseling Tashayla was given extended (15 minutes) diabetes prevention counseling today. She is 47 y.o. female and has risk factors for diabetes including obesity and prediabetes. We discussed intensive lifestyle modifications today with an emphasis on weight loss as well as increasing exercise and decreasing simple carbohydrates in her diet.  Obesity Leinani is currently  in the action stage of change. As such, her goal is to continue with weight loss efforts She has agreed to keep a food journal with 350 to 500 calories and 30 grams of protein at lunch daily and follow the Category 2 plan Shenequa has been instructed to work up to a goal of 150 minutes of combined cardio and strengthening exercise per week for weight loss and overall health benefits. We discussed the following Behavioral Modification Strategies today: increasing lean protein intake, decreasing simple carbohydrates  and work on meal planning and easy cooking plans  Ayaana has agreed to follow up with our clinic in 2 to 3 weeks. She was informed of the importance of frequent follow up visits to maximize her success with intensive lifestyle modifications for her multiple health conditions.   OBESITY BEHAVIORAL INTERVENTION VISIT  Today's visit was # 5   Starting weight: 242 lbs Starting date: 05/14/18 Today's weight : 237 lbs Today's date: 08/05/2018 Total lbs lost to date: 5 At least 15 minutes were spent on discussing the following behavioral intervention visit.   ASK: We discussed the diagnosis of obesity with Gevena Barre today and Taelor agreed to give [name] permission to discuss obesity behavioral modification therapy today.  ASSESS: Jayleah has the diagnosis of obesity and her BMI today is 37.11 Suzane is in the action stage of change   ADVISE: Noely was educated on the multiple health risks of obesity as well as the benefit of weight loss to improve her health. She was advised of the need for long term treatment and the importance of lifestyle modifications to improve her current health and to decrease her risk of future health problems.  AGREE: Multiple dietary modification options and treatment options were discussed and  Tonae agreed to follow the recommendations documented in the above note.  ARRANGE: Azarria was educated on the importance of frequent visits to  treat obesity as outlined per CMS and USPSTF guidelines and agreed to schedule her next follow up appointment today.  I, [place] Crane, am acting as transcriptionist for [employer]  I have reviewed the above documentation for accuracy and completeness, and I agree  with the above. -Dennard Nip, MD

## 2018-08-25 ENCOUNTER — Ambulatory Visit (INDEPENDENT_AMBULATORY_CARE_PROVIDER_SITE_OTHER): Payer: BLUE CROSS/BLUE SHIELD | Admitting: Family Medicine

## 2018-08-25 VITALS — BP 126/88 | HR 92 | Temp 98.0°F | Ht 67.0 in | Wt 239.0 lb

## 2018-08-25 DIAGNOSIS — E559 Vitamin D deficiency, unspecified: Secondary | ICD-10-CM

## 2018-08-25 DIAGNOSIS — Z6837 Body mass index (BMI) 37.0-37.9, adult: Secondary | ICD-10-CM

## 2018-08-25 DIAGNOSIS — F3289 Other specified depressive episodes: Secondary | ICD-10-CM | POA: Diagnosis not present

## 2018-08-25 DIAGNOSIS — Z9189 Other specified personal risk factors, not elsewhere classified: Secondary | ICD-10-CM

## 2018-08-25 DIAGNOSIS — R7303 Prediabetes: Secondary | ICD-10-CM

## 2018-08-26 MED ORDER — METFORMIN HCL 500 MG PO TABS: 500.0000 mg | ORAL_TABLET | Freq: Every day | ORAL | 0 refills | Status: DC

## 2018-08-26 MED ORDER — BUPROPION HCL ER (SR) 200 MG PO TB12: 200.0000 mg | ORAL_TABLET | Freq: Two times a day (BID) | ORAL | 0 refills | Status: DC

## 2018-08-26 MED ORDER — VITAMIN D (ERGOCALCIFEROL) 1.25 MG (50000 UNIT) PO CAPS: 50000.0000 [IU] | ORAL_CAPSULE | ORAL | 0 refills | Status: DC

## 2018-08-26 NOTE — Progress Notes (Signed)
Office: [phone]  /  Fax: [phone]   HPI:   Chief Complaint: OBESITY Katelyn Walker is here to discuss her progress with her obesity treatment plan. She is on the keep a food journal with 350-500 calories and 30 grams of protein at lunch daily and follow the Category 2 plan and is following her eating plan approximately 75 % of the time. She states she is walking for 20-40 minutes 2 times per week. Katelyn Walker has struggled more over the last 2 to 3 weeks with increased stress eating and decreased meal prepping, with increased stress at work. She is frustrated with herself that she is struggling so much, but she is ready to get back on track.  Her weight is 239 lb (108.4 kg) today and has gained 2 pounds since her last visit. She has lost 3 lbs since starting treatment with [name].  Pre-Diabetes Katelyn Walker has a diagnosis of pre-diabetes based on her elevated Hgb A1c and was informed this puts her at greater risk of developing diabetes. She is stable on metformin and continues to work on diet and exercise to decrease risk of diabetes. She denies nausea, vomiting, or hypoglycemia.  At risk for diabetes Katelyn Walker is at higher than average risk for developing diabetes due to her obesity and pre-diabetes. She currently denies polyuria or polydipsia.  Vitamin D Deficiency Katelyn Walker has a diagnosis of vitamin D deficiency. She is stable on prescription Vit D, but level is not yet at goal. She denies nausea, vomiting or muscle weakness.  Depression with emotional eating behaviors Katelyn Walker notes increased stress and increased comfort eating, worse in the last 2 to 3 weeks and worse with increased work stress. Katelyn Walker struggles with emotional eating and using food for comfort to the extent that it is negatively impacting her health. She often snacks when she is not hungry. Katelyn Walker sometimes feels she is out of control and then feels guilty that she made poor food choices. She has been working on behavior  modification techniques to help reduce her emotional eating and has been somewhat successful. She shows no sign of suicidal or homicidal ideations.  Depression screen PHQ 2/9 05/14/2018  Decreased Interest 1  Down, Depressed, Hopeless 2  PHQ - 2 Score 3  Altered sleeping 3  Tired, decreased energy 2  Change in appetite 1  Feeling bad or failure about yourself  1  Trouble concentrating 1  Moving slowly or fidgety/restless 0  Suicidal thoughts 0  PHQ-9 Score 11  Difficult doing work/chores Not difficult at all    ALLERGIES: Allergies  Allergen Reactions  . Contrave [Naltrexone-Bupropion Hcl Er] Other (See Comments)    Vertigo/ dizziness    MEDICATIONS: Current Outpatient Medications on File Prior to Visit  Medication Sig Dispense Refill  . ferrous sulfate 325 (65 FE) MG tablet Take 325 mg by mouth daily with breakfast.    . levothyroxine (SYNTHROID, LEVOTHROID) 75 MCG tablet Take 75 mcg by mouth daily before breakfast.    . Norethindrone Acetate-Ethinyl Estrad-FE (BLISOVI 24 FE) 1-20 MG-MCG(24) tablet Take 1 tablet by mouth daily.    Marland Kitchen omeprazole (PRILOSEC) 40 MG capsule Take 40 mg by mouth daily.    . valsartan (DIOVAN) 160 MG tablet Take 160 mg by mouth daily.    Marland Kitchen zolpidem (AMBIEN) 10 MG tablet Take 10 mg by mouth at bedtime as needed for sleep.     No current facility-administered medications on file prior to visit.     PAST MEDICAL HISTORY: Past Medical History:  Diagnosis  Date  . Back pain   . Dyspnea   . Elevated blood pressure reading without diagnosis of hypertension   . Esophageal ulcer   . GERD (gastroesophageal reflux disease)   . Hot flashes   . Hypothyroid   . Joint pain   . Sciatica, left side   . Vitamin B 12 deficiency   . Vitamin D deficiency     PAST SURGICAL HISTORY: Past Surgical History:  Procedure Laterality Date  . DILATION AND CURETTAGE OF UTERUS     2011   . OVARIAN CYST SURGERY     2009, 2013    SOCIAL HISTORY: Social History    Tobacco Use  . Smoking status: Never Smoker  . Smokeless tobacco: Never Used  Substance Use Topics  . Alcohol use: Not on file  . Drug use: Not on file    FAMILY HISTORY: Family History  Problem Relation Age of Onset  . Alcoholism Mother   . Eating disorder Mother   . Diabetes Father   . Hypertension Father   . Sleep apnea Father     ROS: Review of Systems  Constitutional: Negative for weight loss.  Gastrointestinal: Negative for nausea and vomiting.  Genitourinary: Negative for frequency.  Musculoskeletal:       Negative muscle weakness  Endo/Heme/Allergies: Negative for polydipsia.       Negative hypoglycemia  Psychiatric/Behavioral: Positive for depression. Negative for suicidal ideas.    PHYSICAL EXAM: Blood pressure 126/88, pulse 92, temperature 98 F (36.7 C), temperature source Oral, height 5\' 7"  (1.702 m), weight 239 lb (108.4 kg), SpO2 98 %. Body mass index is 37.43 kg/m. Physical Exam  Constitutional: She is oriented to person, place, and time. She appears well-developed and well-nourished.  Cardiovascular: Normal rate.  Pulmonary/Chest: Effort normal.  Musculoskeletal: Normal range of motion.  Neurological: She is oriented to person, place, and time.  Skin: Skin is warm and dry.  Psychiatric: She has a normal mood and affect. Her behavior is normal.  Vitals reviewed.   RECENT LABS AND TESTS: BMET    Component Value Date/Time   NA 139 05/14/2018 1138   K 4.7 05/14/2018 1138   CL 102 05/14/2018 1138   CO2 23 05/14/2018 1138   GLUCOSE 89 05/14/2018 1138   BUN 16 05/14/2018 1138   CREATININE 0.85 05/14/2018 1138   CALCIUM 8.9 05/14/2018 1138   GFRNONAA 82 05/14/2018 1138   GFRAA 94 05/14/2018 1138   Lab Results  Component Value Date   HGBA1C 5.8 (H) 05/14/2018   Lab Results  Component Value Date   INSULIN 18.3 05/14/2018   CBC    Component Value Date/Time   WBC 8.7 05/14/2018 1138   WBC 13.7 (H) 09/09/2007 0546   RBC 4.96 05/14/2018  1138   RBC 3.11 (L) 09/09/2007 0546   HGB 12.0 05/14/2018 1138   HCT 38.8 05/14/2018 1138   PLT 197 09/09/2007 0546   MCV 78 (L) 05/14/2018 1138   MCH 24.2 (L) 05/14/2018 1138   MCHC 30.9 (L) 05/14/2018 1138   MCHC 33.6 09/09/2007 0546   RDW 16.7 (H) 05/14/2018 1138   LYMPHSABS 1.8 05/14/2018 1138   EOSABS 0.1 05/14/2018 1138   BASOSABS 0.0 05/14/2018 1138   Iron/TIBC/Ferritin/ %Sat No results found for: IRON, TIBC, FERRITIN, IRONPCTSAT Lipid Panel     Component Value Date/Time   CHOL 171 05/14/2018 1138   TRIG 145 05/14/2018 1138   HDL 44 05/14/2018 1138   LDLCALC 98 05/14/2018 1138   Hepatic Function Panel  Component Value Date/Time   PROT 6.7 05/14/2018 1138   ALBUMIN 4.1 05/14/2018 1138   AST 13 05/14/2018 1138   ALT 9 05/14/2018 1138   ALKPHOS 76 05/14/2018 1138   BILITOT 0.2 05/14/2018 1138      Component Value Date/Time   TSH 2.830 05/14/2018 1138  Results for LUZELENA, DURRANI (MRN [id number]) as of 08/26/2018 08:24  Ref. Range 05/14/2018 11:38  Vitamin D, 25-Hydroxy Latest Ref Range: 30.0 - 100.0 ng/mL 30.5    ASSESSMENT AND PLAN: Prediabetes - Plan: metFORMIN (GLUCOPHAGE) 500 MG tablet  Vitamin D deficiency - Plan: Vitamin D, Ergocalciferol, (DRISDOL) 50000 units CAPS capsule  Other depression - with emotional eating - Plan: buPROPion (WELLBUTRIN SR) 200 MG 12 hr tablet  At risk for diabetes mellitus  Class 2 severe obesity with serious comorbidity and body mass index (BMI) of 37.0 to 37.9 in adult, unspecified obesity type (HCC)  PLAN:  Pre-Diabetes Katelyn Walker will continue to work on weight loss, exercise, and decreasing simple carbohydrates in her diet to help decrease the risk of diabetes. We dicussed metformin including benefits and risks. She was informed that eating too many simple carbohydrates or too many calories at one sitting increases the likelihood of GI side effects. Shadee agrees to continue taking metformin 500 mg q AM #30 and we  will refill for 1 month. Moniesha agrees to follow up with our clinic in 2 weeks with Eber Jones, our registered dietitian and in 4 weeks with myself as directed to monitor her progress.  Diabetes risk counselling Chamara was given extended (15 minutes) diabetes prevention counseling today. She is 47 y.o. female and has risk factors for diabetes including obesity and pre-diabetes. We discussed intensive lifestyle modifications today with an emphasis on weight loss as well as increasing exercise and decreasing simple carbohydrates in her diet.  Vitamin D Deficiency Katelyn Walker was informed that low vitamin D levels contributes to fatigue and are associated with obesity, breast, and colon cancer. Katelyn Walker agrees to continue taking prescription Vit D @50 ,000 IU every week #4 and we will refill for 1 month. She will follow up for routine testing of vitamin D, at least 2-3 times per year. She was informed of the risk of over-replacement of vitamin D and agrees to not increase her dose unless she discusses this with [name] first. Katelyn Walker agrees to follow up with our clinic in 2 weeks with Eber Jones, our registered dietitian and in 4 weeks with myself.  Depression with Emotional Eating Behaviors We discussed behavior modification techniques today to help Katelyn Walker deal with her emotional eating and depression. Katelyn Walker agrees to change Wellbutrin to SR 200 mg BID #60 with no refills. Katelyn Walker agrees to follow up with our clinic in 2 weeks with Eber Jones, our registered dietitian and in 4 weeks with myself.  Obesity Katelyn Walker is currently in the action stage of change. As such, her goal is to continue with weight loss efforts She has agreed to follow the Category 2 plan with modified breakfast  Katelyn Walker has been instructed to work up to a goal of 150 minutes of combined cardio and strengthening exercise per week for weight loss and overall health benefits. We discussed the following Behavioral Modification Strategies today:  increasing lean protein intake, decreasing simple carbohydrates, emotional eating strategies, and no skipping meals   Katelyn Walker has agreed to follow up with our clinic in 2 weeks with Eber Jones, our registered dietitian and in 4 weeks with myself. She was informed of the importance of frequent follow up visits  to maximize her success with intensive lifestyle modifications for her multiple health conditions.   OBESITY BEHAVIORAL INTERVENTION VISIT  Today's visit was # 6   Starting weight: 242 lbs Starting date: 05/14/18 Today's weight : 239 lbs  Today's date: 08/25/2018 Total lbs lost to date: 3    ASK: We discussed the diagnosis of obesity with Katelyn Walker today and Katelyn Walker agreed to give [name] permission to discuss obesity behavioral modification therapy today.  ASSESS: Katelyn Walker has the diagnosis of obesity and her BMI today is 37.42 Katelyn Walker is in the action stage of change   ADVISE: Katelyn Walker was educated on the multiple health risks of obesity as well as the benefit of weight loss to improve her health. She was advised of the need for long term treatment and the importance of lifestyle modifications to improve her current health and to decrease her risk of future health problems.  AGREE: Multiple dietary modification options and treatment options were discussed and  Katelyn Walker agreed to follow the recommendations documented in the above note.  ARRANGE: Katelyn Walker was educated on the importance of frequent visits to treat obesity as outlined per CMS and USPSTF guidelines and agreed to schedule her next follow up appointment today.  I, Burt Knack, am acting as transcriptionist for Quillian Quince, MD  I have reviewed the above documentation for accuracy and completeness, and I agree with the above. -Quillian Quince, MD

## 2018-08-29 ENCOUNTER — Encounter (INDEPENDENT_AMBULATORY_CARE_PROVIDER_SITE_OTHER): Payer: Self-pay | Admitting: Family Medicine

## 2018-09-04 DIAGNOSIS — K635 Polyp of colon: Secondary | ICD-10-CM | POA: Diagnosis not present

## 2018-09-04 DIAGNOSIS — D128 Benign neoplasm of rectum: Secondary | ICD-10-CM | POA: Diagnosis not present

## 2018-09-04 DIAGNOSIS — D125 Benign neoplasm of sigmoid colon: Secondary | ICD-10-CM | POA: Diagnosis not present

## 2018-09-04 DIAGNOSIS — K648 Other hemorrhoids: Secondary | ICD-10-CM | POA: Diagnosis not present

## 2018-09-04 DIAGNOSIS — K621 Rectal polyp: Secondary | ICD-10-CM | POA: Diagnosis not present

## 2018-09-04 DIAGNOSIS — K5730 Diverticulosis of large intestine without perforation or abscess without bleeding: Secondary | ICD-10-CM | POA: Diagnosis not present

## 2018-09-04 DIAGNOSIS — K625 Hemorrhage of anus and rectum: Secondary | ICD-10-CM | POA: Diagnosis not present

## 2018-09-04 HISTORY — PX: COLONOSCOPY: SHX174

## 2018-09-08 ENCOUNTER — Ambulatory Visit (INDEPENDENT_AMBULATORY_CARE_PROVIDER_SITE_OTHER): Payer: BLUE CROSS/BLUE SHIELD | Admitting: Dietician

## 2018-09-08 VITALS — Ht 67.0 in | Wt 237.0 lb

## 2018-09-08 DIAGNOSIS — Z9189 Other specified personal risk factors, not elsewhere classified: Secondary | ICD-10-CM

## 2018-09-08 DIAGNOSIS — Z6838 Body mass index (BMI) 38.0-38.9, adult: Secondary | ICD-10-CM | POA: Diagnosis not present

## 2018-09-08 DIAGNOSIS — R7303 Prediabetes: Secondary | ICD-10-CM | POA: Diagnosis not present

## 2018-09-08 DIAGNOSIS — E66812 Obesity, class 2: Secondary | ICD-10-CM

## 2018-09-08 NOTE — Progress Notes (Signed)
  Office: [phone]  /  Fax: [phone]     Katelyn Walker has a diagnosis of prediabetes based on her elevated HgA1c and was informed this puts her at greater risk of developing diabetes. Katelyn Walker is here today to for diabetic risk nutrition counseling which includes her obesity treatment plan.   Katelyn Walker's weight today is 237 lbs, a 2 lb weight loss since her last visit. She has had a weight loss of 5 lb weight loss since beginning treatment with us.   She reports she is only following her Category 2 meal plan with modified breakfast about 25% of the time. She admits she is struggling with significant emotional and stress eating. Also eating as a reward with significant guilt and frustration associated with her eating behaviors. Strategies for emotional/reward eating and eating for non hunger reasons were discussed. She was willing to schedule an  Appointment with our bariatric psychologist Dr. Dewaine CongerBarker.   Reviewed nutrients ie protein, fats, simple and complex carbohydrates and how these affect insulin response and her weight. She reports she has continued to eat out for lunch almost daily. She states she uses eating out as a reward for the increased stress she is feeling at work. Discussed strategies to get Katelyn Walker back on track and she has agreed to journal her food intake daily using the application My Fitness Pal. Her nutrition goals are 1200-1300 calories and 75+ g protein. The journaling meal plan was reviewed in detail.    Katelyn Walker is on the following meal plan: journaling 1200-1300 calories, 75+ g protein.  Her meal plan was individualized for maximum benefit.  Also discussed at length the following behavioral modifications to help maximize success : increasing lean protein intake and the best sources for lean proteins, decreasing eating out, avoiding skipping meals, meal planning and cooking strategies, keeping healthy foods in the home, emotional eating strategies, family/coworker sabotage,   planning for success, keeping a strict food journal, and decreasing junk food.    Katelyn Walker has been instructed to work up to a goal of 150 minutes of combined cardio and strengthening exercise per week for weight loss and overall health benefits. Written information was provided and the following handouts were given: Journaling instructions.     OBESITY BEHAVIORAL INTERVENTION VISIT  Today's visit was # 7   Starting weight: 242 lbs Starting date: 05/14/18 Today's weight : Weight: 237 lb (107.5 kg)  Today's date: 09/08/2018 Total lbs lost to date:  5 lbs  ASK: We discussed the diagnosis of obesity with Katelyn Walker today and Katelyn Walker agreed to give us permission to discuss obesity behavioral modification therapy today.  ASSESS: Katelyn Walker has the diagnosis of obesity and her BMI today is 737 Katelyn Walker is in the action stage of change   ADVISE: Katelyn Walker was educated on the multiple health risks of obesity as well as the benefit of weight loss to improve her health. She was advised of the need for long term treatment and the importance of lifestyle modifications to improve her current health and to decrease her risk of future health problems.  AGREE: Multiple dietary modification options and treatment options were discussed and  Katelyn Walker agreed to follow the recommendations documented in the above note.  ARRANGE: Katelyn Walker was educated on the importance of frequent visits to treat obesity as outlined per CMS and USPSTF guidelines and agreed to schedule her next follow up appointment today.

## 2018-09-10 ENCOUNTER — Encounter (INDEPENDENT_AMBULATORY_CARE_PROVIDER_SITE_OTHER): Payer: Self-pay

## 2018-09-15 ENCOUNTER — Encounter (INDEPENDENT_AMBULATORY_CARE_PROVIDER_SITE_OTHER): Payer: Self-pay

## 2018-09-22 ENCOUNTER — Ambulatory Visit (INDEPENDENT_AMBULATORY_CARE_PROVIDER_SITE_OTHER): Payer: BLUE CROSS/BLUE SHIELD | Admitting: Family Medicine

## 2018-09-22 VITALS — BP 121/76 | HR 68 | Temp 98.2°F | Ht 67.0 in | Wt 236.0 lb

## 2018-09-22 DIAGNOSIS — E559 Vitamin D deficiency, unspecified: Secondary | ICD-10-CM

## 2018-09-22 DIAGNOSIS — Z6837 Body mass index (BMI) 37.0-37.9, adult: Secondary | ICD-10-CM

## 2018-09-22 DIAGNOSIS — F3289 Other specified depressive episodes: Secondary | ICD-10-CM | POA: Diagnosis not present

## 2018-09-22 DIAGNOSIS — Z9189 Other specified personal risk factors, not elsewhere classified: Secondary | ICD-10-CM | POA: Diagnosis not present

## 2018-09-22 DIAGNOSIS — R7303 Prediabetes: Secondary | ICD-10-CM

## 2018-09-22 MED ORDER — METFORMIN HCL 500 MG PO TABS: 500.0000 mg | ORAL_TABLET | Freq: Every day | ORAL | 0 refills | Status: DC

## 2018-09-22 MED ORDER — BUPROPION HCL ER (SR) 200 MG PO TB12: 200.0000 mg | ORAL_TABLET | Freq: Two times a day (BID) | ORAL | 0 refills | Status: DC

## 2018-09-23 NOTE — Progress Notes (Signed)
Office: [phone]  /  Fax: [phone]   HPI:   Chief Complaint: OBESITY Aashvi is here to discuss her progress with her obesity treatment plan. She is on the  follow the Category 2 plan with a modified breakfast and is following her eating plan approximately 75 % of the time. She states she is exercising by walking for 30  minutes 1 times per week. Lynnae Sandhoff journaling on paper without calorie information. She is now more aware of how emotions affect her eating. She reports not eating enough protein.   Her weight is 236 lb (107 kg) today and has had a weight loss of 1 pound over a period of 4 weeks since her last visit. She has lost 4 lbs since starting treatment with [name].  Pre-Diabetes Gloris has a diagnosis of prediabetes based on her elevated HgA1c and was informed this puts her at greater risk of developing diabetes. She is taking metformin currently and continues to work on diet and exercise to decrease risk of diabetes. She denies nausea or hypoglycemia.  Vitamin D deficiency Triva has a diagnosis of vitamin D deficiency. She is currently taking vit D but not yet at goal and denies nausea, vomiting or muscle weakness.   Ref. Range 05/14/2018 11:38  Vitamin D, 25-Hydroxy Latest Ref Range: 30.0 - 100.0 ng/mL 30.5   Depression with emotional eating behaviors Graceann is struggling with emotional eating and using food for comfort to the extent that it is negatively impacting her health. She often snacks when she is not hungry. Audianna sometimes feels she is out of control and then feels guilty that she made poor food choices. She has been working on behavior modification techniques to help reduce her emotional eating and has been somewhat successful. She has an appointment with psychologist next week.  She shows no sign of suicidal or homicidal ideations. She denies dry mouth.   Depression screen PHQ 2/9 05/14/2018  Decreased Interest 1  Down, Depressed, Hopeless 2  PHQ - 2 Score 3    Altered sleeping 3  Tired, decreased energy 2  Change in appetite 1  Feeling bad or failure about yourself  1  Trouble concentrating 1  Moving slowly or fidgety/restless 0  Suicidal thoughts 0  PHQ-9 Score 11  Difficult doing work/chores Not difficult at all   At risk for diabetes Xiamara is at higher than averagerisk for developing diabetes due to her obesity. She currently denies polyuria or polydipsia.  ALLERGIES: Allergies  Allergen Reactions  . Contrave [Naltrexone-Bupropion Hcl Er] Other (See Comments)    Vertigo/ dizziness    MEDICATIONS: Current Outpatient Medications on File Prior to Visit  Medication Sig Dispense Refill  . levothyroxine (SYNTHROID, LEVOTHROID) 75 MCG tablet Take 75 mcg by mouth daily before breakfast.    . Norethindrone Acetate-Ethinyl Estrad-FE (BLISOVI 24 FE) 1-20 MG-MCG(24) tablet Take 1 tablet by mouth daily.    Marland Kitchen omeprazole (PRILOSEC) 40 MG capsule Take 40 mg by mouth daily.    . valsartan (DIOVAN) 160 MG tablet Take 160 mg by mouth daily.    . Vitamin D, Ergocalciferol, (DRISDOL) 50000 units CAPS capsule Take 1 capsule (50,000 Units total) by mouth every 7 (seven) days. 4 capsule 0  . zolpidem (AMBIEN) 10 MG tablet Take 10 mg by mouth at bedtime as needed for sleep.     No current facility-administered medications on file prior to visit.     PAST MEDICAL HISTORY: Past Medical History:  Diagnosis Date  . Back pain   .  Dyspnea   . Elevated blood pressure reading without diagnosis of hypertension   . Esophageal ulcer   . GERD (gastroesophageal reflux disease)   . Hot flashes   . Hypothyroid   . Joint pain   . Sciatica, left side   . Vitamin B 12 deficiency   . Vitamin D deficiency     PAST SURGICAL HISTORY: Past Surgical History:  Procedure Laterality Date  . DILATION AND CURETTAGE OF UTERUS     2011   . OVARIAN CYST SURGERY     2009, 2013    SOCIAL HISTORY: Social History   Tobacco Use  . Smoking status: Never Smoker  .  Smokeless tobacco: Never Used  Substance Use Topics  . Alcohol use: Not on file  . Drug use: Not on file    FAMILY HISTORY: Family History  Problem Relation Age of Onset  . Alcoholism Mother   . Eating disorder Mother   . Diabetes Father   . Hypertension Father   . Sleep apnea Father     ROS: Review of Systems  Constitutional: Positive for weight loss.  HENT:       Negative for dry mouth   Gastrointestinal: Negative for nausea and vomiting.  Musculoskeletal:       Negative for muscle weakness  Endo/Heme/Allergies: Negative for polydipsia.       Negative for hypoglycemia and polyuria  Psychiatric/Behavioral: Positive for depression. Negative for suicidal ideas.       Negative for homicidal ideations     PHYSICAL EXAM: Blood pressure 121/76, pulse 68, temperature 98.2 F (36.8 C), temperature source Oral, height 5\' 7"  (1.702 m), weight 236 lb (107 kg), SpO2 99 %. Body mass index is 36.96 kg/m. Physical Exam  Constitutional: She is oriented to person, place, and time. She appears well-developed and well-nourished.  HENT:  Head: Normocephalic.  Neck: Normal range of motion.  Cardiovascular: Normal rate.  Pulmonary/Chest: Effort normal.  Musculoskeletal: Normal range of motion.  Neurological: She is alert and oriented to person, place, and time.  Skin: Skin is warm and dry.  Psychiatric: She has a normal mood and affect. Her behavior is normal.  Vitals reviewed.   RECENT LABS AND TESTS: BMET    Component Value Date/Time   NA 139 05/14/2018 1138   K 4.7 05/14/2018 1138   CL 102 05/14/2018 1138   CO2 23 05/14/2018 1138   GLUCOSE 89 05/14/2018 1138   BUN 16 05/14/2018 1138   CREATININE 0.85 05/14/2018 1138   CALCIUM 8.9 05/14/2018 1138   GFRNONAA 82 05/14/2018 1138   GFRAA 94 05/14/2018 1138   Lab Results  Component Value Date   HGBA1C 5.8 (H) 05/14/2018   Lab Results  Component Value Date   INSULIN 18.3 05/14/2018   CBC    Component Value Date/Time     WBC 8.7 05/14/2018 1138   WBC 13.7 (H) 09/09/2007 0546   RBC 4.96 05/14/2018 1138   RBC 3.11 (L) 09/09/2007 0546   HGB 12.0 05/14/2018 1138   HCT 38.8 05/14/2018 1138   PLT 197 09/09/2007 0546   MCV 78 (L) 05/14/2018 1138   MCH 24.2 (L) 05/14/2018 1138   MCHC 30.9 (L) 05/14/2018 1138   MCHC 33.6 09/09/2007 0546   RDW 16.7 (H) 05/14/2018 1138   LYMPHSABS 1.8 05/14/2018 1138   EOSABS 0.1 05/14/2018 1138   BASOSABS 0.0 05/14/2018 1138   Iron/TIBC/Ferritin/ %Sat No results found for: IRON, TIBC, FERRITIN, IRONPCTSAT Lipid Panel     Component Value Date/Time  CHOL 171 05/14/2018 1138   TRIG 145 05/14/2018 1138   HDL 44 05/14/2018 1138   LDLCALC 98 05/14/2018 1138   Hepatic Function Panel     Component Value Date/Time   PROT 6.7 05/14/2018 1138   ALBUMIN 4.1 05/14/2018 1138   AST 13 05/14/2018 1138   ALT 9 05/14/2018 1138   ALKPHOS 76 05/14/2018 1138   BILITOT 0.2 05/14/2018 1138      Component Value Date/Time   TSH 2.830 05/14/2018 1138    Ref. Range 05/14/2018 11:38  Vitamin D, 25-Hydroxy Latest Ref Range: 30.0 - 100.0 ng/mL 30.5    ASSESSMENT AND PLAN: Prediabetes - Plan: metFORMIN (GLUCOPHAGE) 500 MG tablet  Vitamin D deficiency  Other depression - with emotional eating - Plan: buPROPion (WELLBUTRIN SR) 200 MG 12 hr tablet  At risk for diabetes mellitus  Class 2 severe obesity with serious comorbidity and body mass index (BMI) of 37.0 to 37.9 in adult, unspecified obesity type [facility])  PLAN: Pre-Diabetes Kenyette will continue to work on weight loss, exercise, and decreasing simple carbohydrates in her diet to help decrease the risk of diabetes. We dicussed metformin including benefits and risks. She was informed that eating too many simple carbohydrates or too many calories at one sitting increases the likelihood of GI side effects. Arlie agreed to continue Metformin 500 mg daily #30 with no refills. Hatsumi agreed to follow up with [name] as directed to monitor  her progress. We will repeat labs next visit.   Vitamin D Deficiency Kimyatta was informed that low vitamin D levels contributes to fatigue and are associated with obesity, breast, and colon cancer. She agrees to continue to take prescription Vit D @50 ,000 IU every week and will follow up for routine testing of vitamin D, at least 2-3 times per year. She was informed of the risk of over-replacement of vitamin D and agrees to not increase her dose unless she discusses this with [name] first. We will repeat labs next visit. Agrees to follow up with our clinic as directed.   Depression with Emotional Eating Behaviors We discussed behavior modification techniques today to help Ellanora deal with her emotional eating and depression. She has agreed to continue Wellbutrin SR 150 mg qd #30 with no refills. Discussed to continue journaling emotions and eating and agreed to follow up as directed.  Diabetes risk counseling Ellianna was given extended (15 minutes) diabetes prevention counseling today. She is 47 y.o. female and has risk factors for diabetes including obesity. We discussed intensive lifestyle modifications today with an emphasis on weight loss as well as increasing exercise and decreasing simple carbohydrates in her diet.  Obesity Gusta is currently in the action stage of change. As such, her goal is to continue with weight loss efforts She has agreed to follow the Category 2 plan with modified breakfast.  Discussed to journal protein intake in My Fitness Pal.  Alohilani has been instructed to work up to a goal of 150 minutes of combined cardio and strengthening exercise per week for weight loss and overall health benefits. We discussed the following Behavioral Modification Strategies today: increasing lean protein intake, decreasing simple carbohydrates , decrease eating out, holiday eating strategies, celebration eating strategies, better snacking choices, increasing water intake, and emotional eating  strategies.   Eilee has agreed to follow up with our clinic in 3 weeks. She was informed of the importance of frequent follow up visits to maximize her success with intensive lifestyle modifications for her multiple health conditions.  OBESITY BEHAVIORAL INTERVENTION VISIT  Today's visit was # 7   Starting weight: 242 lb Starting date: 05/14/18 Today's weight : 236 lb Today's date: 09/22/18 Total lbs lost to date: 4 lb    ASK: We discussed the diagnosis of obesity with Gevena Barre today and Sebastiana agreed to give [name] permission to discuss obesity behavioral modification therapy today.  ASSESS: Lafawn has the diagnosis of obesity and her BMI today is 36.95 Emersynn is in the action stage of change   ADVISE: Marisela was educated on the multiple health risks of obesity as well as the benefit of weight loss to improve her health. She was advised of the need for long term treatment and the importance of lifestyle modifications to improve her current health and to decrease her risk of future health problems.  AGREE: Multiple dietary modification options and treatment options were discussed and  Jaqlyn agreed to follow the recommendations documented in the above note.  ARRANGE: Elizabeth was educated on the importance of frequent visits to treat obesity as outlined per CMS and USPSTF guidelines and agreed to schedule her next follow up appointment today.  I, Jeralene Peters, am acting as transcriptionist for Quillian Quince, MD   I have reviewed the above documentation for accuracy and completeness, and I agree with the above. -Quillian Quince, MD

## 2018-10-01 ENCOUNTER — Ambulatory Visit (INDEPENDENT_AMBULATORY_CARE_PROVIDER_SITE_OTHER): Payer: BLUE CROSS/BLUE SHIELD | Admitting: Psychology

## 2018-10-01 DIAGNOSIS — F3289 Other specified depressive episodes: Secondary | ICD-10-CM | POA: Diagnosis not present

## 2018-10-01 NOTE — Progress Notes (Signed)
Office: [phone]  /  Fax: [phone]  Date: October 01, 2018 Time Seen: 2:04pm Duration: 54 minutes Provider: Glennie Isle, PsyD Type of Session: Intake for Individual Therapy   Informed Consent:The provider's role was explained to [employer]. The provider reviewed and discussed issues of confidentiality, privacy, and limits therein. In addition to verbal informed consent, written informed consent for psychological services was obtained from [place] prior to the initial intake interview. Written consent included information concerning the practice, financial arrangements, and confidentiality and patients' rights. Since the clinic is not a 24/7 crisis center, mental health emergency resources were shared and a handout was provided. The provider further explained the utilization of MyChart, e-mail, voicemail, and/or other messaging systems can be utilized for non-emergency reasons. Jelene verbally acknowledged understanding of the aforementioned, and agreed to use mental health emergency resources discussed if needed. Moreover, Rosellen agreed information may be shared with other [facility] providers as needed for coordination of care, and written consent was obtained.   Chief Complaint: Kamilla was referred by Dr. Dennard Nip due to depression with emotional eating behaviors. Per the note for the last visit with Dr. Dennard Nip on September 22, 2018, "Lorine is struggling with emotional eating and using food for comfort to the extent that it is negatively impacting her health. She often snacks when she is not hungry. Matalie sometimes feels she is out of control and then feels guilty that she made poor food choices. She has been working on behavior modification techniques to help reduce her emotional eating and has been somewhat successful. She has an appointment with psychologist next week.  She shows no sign of suicidal or homicidal ideations. She denies dry  mouth."   During today's appointment, Atira reported that she informed the dietician that logging food is "stressful" and therefore, was reportedly recommended to see this provider. She indicated the last episode of emotional eating was "earlier this week." Yarithza indicated that she went and "got the breakfast" she wanted to eat. Regarding her current prescribed meal plan, Seferina noted, "I am really encouraged by it. "  Irving was asked to complete a questionnaire assessing various behaviors related to emotional eating. Arnika endorsed the following: eat certain foods when you are anxious, stressed, depressed, or your feelings are hurt, find food is comforting to you and eat as a reward.  HPI: Per the note for the initial visit with Dr. Dennard Nip on May 14, 2018, Tressia shared she has been heavy most of her life and started gaining weight at age 31. Her heaviest weight ever was 255 pounds. During her initial appointment Dr. Leafy Ro, Wynona Canes reported experiencing the following: significant food cravings issues; frequently making poor food choices; frequently eating larger portions than normal; and struggling with emotional eating. During today's appointment, Krysteena reported emotional eating started in childhood. She denied a history of binge eating. Kanitra denied a history of purging and engagement in any other compensatory strategies. She denied ever being diagnosed with an eating disorder. Moreover, Caeleigh indicated that her focus on weight and food likely started when her mother started focusing on it during childhood. She indicated she was rewarded for reaching certain weight goals.   Mental Status Examination: Darlisa arrived on time for the appointment; however, the appointment was initiated late due to this provider. She presented as appropriately dressed and groomed. Jesslyn appeared her stated age and demonstrated adequate orientation to time, place, person, and purpose of the  appointment. She also demonstrated appropriate eye  contact. No psychomotor abnormalities or behavioral peculiarities noted. Her mood was euthymic with congruent affect. Her thought processes were logical, linear, and goal-directed. No hallucinations, delusions, bizarre thinking or behavior reported or observed. Judgment, insight, and impulse control appeared to be grossly intact. There was no evidence of paraphasias (i.e., errors in speech, gross mispronunciations, and word substitutions), repetition deficits, or disturbances in volume or prosody (i.e., rhythm and intonation). There was no evidence of attention or memory impairments. Raelee denied current suicidal and homicidal ideation, plan, and intent.   The Mini-Mental State Examination, Second Edition (MMSE-2) was administered. The MMSE-2 briefly screens for cognitive dysfunction and overall mental status and assesses different cognitive domains: orientation, registration, attention and calculation, recall, and language and praxis. Janifer received 30 out of 30 points possible on the MMSE-2, which is noted in the normal range.   Family & Psychosocial History: Auden reported she has been married for 12 years and has one daughter (age 24). 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 Thus, she noted she has flexibility. 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 She stated her social support system consists of two close girlfriends, business partner, and husband. Adayah reported she identifies with Christianity.   Medical History:  Past Medical History:  Diagnosis Date  . Back pain   . Dyspnea   . Elevated blood pressure reading without diagnosis of hypertension   . Esophageal ulcer   . GERD (gastroesophageal reflux disease)   . Hot flashes   . Hypothyroid   . Joint pain   . Sciatica, left side   . Vitamin B 12 deficiency   . Vitamin D deficiency    Past  Surgical History:  Procedure Laterality Date  . DILATION AND CURETTAGE OF UTERUS     2011   . OVARIAN CYST SURGERY     2009, 2013   Current Outpatient Medications on File Prior to Visit  Medication Sig Dispense Refill  . buPROPion (WELLBUTRIN SR) 200 MG 12 hr tablet Take 1 tablet (200 mg total) by mouth 2 (two) times daily. 60 tablet 0  . levothyroxine (SYNTHROID, LEVOTHROID) 75 MCG tablet Take 75 mcg by mouth daily before breakfast.    . metFORMIN (GLUCOPHAGE) 500 MG tablet Take 1 tablet (500 mg total) by mouth daily with breakfast. 30 tablet 0  . Norethindrone Acetate-Ethinyl Estrad-FE (BLISOVI 24 FE) 1-20 MG-MCG(24) tablet Take 1 tablet by mouth daily.    Marland Kitchen omeprazole (PRILOSEC) 40 MG capsule Take 40 mg by mouth daily.    . valsartan (DIOVAN) 160 MG tablet Take 160 mg by mouth daily.    . Vitamin D, Ergocalciferol, (DRISDOL) 50000 units CAPS capsule Take 1 capsule (50,000 Units total) by mouth every 7 (seven) days. 4 capsule 0  . zolpidem (AMBIEN) 10 MG tablet Take 10 mg by mouth at bedtime as needed for sleep.     No current facility-administered medications on file prior to visit.   Ajanae denied a history of head injuries and loss of consciousness.   Mental Health History: Yanisa reported she first received therapeutic services in 2013 until 2015. She explained it was secondary to financial stressors. Rhyann clarified it was individual therapy and noted she has attended a few couples sessions with her husband's therapist. She denied a history of hospitalization for psychiatric reasons, and has never met with a psychiatrist. She explained her primary care physician prescribes Wellbutrin, and her OB/GYN prescribed Ambien. Ahonesty reported  one of her sisters "went through a tough time during high school;" however, is unsure of any diagnoses. Alizeh denied a trauma history, including sexual, physical, and psychological abuse, as well as neglect.   Hania reported experiencing the  following: feeling down; difficulty falling asleep; racing thoughts; fatigue; fluctuations in appetite; attention and concentration difficulties; and worry thoughts about work stability, her daughter, and marriage. She stated she averages approximately eight hours of sleep despite the abovementioned difficulty. [name] denied experiencing the following: hopelessness; obsessions and compulsions; mania; substance use; hallucinations and delusions; history of and current engagement in self-harm; history of and current suicidal and homicidal ideation, plan, and intent; angry outbursts; and memory concerns. She also denied a history of legal involvement.   Structured Assessment Results: The Patient Health Questionnaire-9 (PHQ-9) is a self-report measure that assesses symptoms and severity of depression over the course of the last two weeks. Fannye obtained a score of four suggesting minimal depression. Mckinzee finds the endorsed symptoms to be not difficult at all. Depression screen PHQ 2/9 10/01/2018  Decreased Interest 0  Down, Depressed, Hopeless 1  PHQ - 2 Score 1  Altered sleeping 1  Tired, decreased energy 1  Change in appetite 1  Feeling bad or failure about yourself  0  Trouble concentrating 0  Moving slowly or fidgety/restless 0  Suicidal thoughts 0  PHQ-9 Score 4  Difficult doing work/chores -   The Generalized Anxiety Disorder-7 (GAD-7) is a brief self-report measure that assesses symptoms of anxiety over the course of the last two weeks. Jeraldean obtained a score of two suggesting minimal anxiety. GAD 7 : Generalized Anxiety Score 10/01/2018  Nervous, Anxious, on Edge 1  Control/stop worrying 0  Worry too much - different things 1  Trouble relaxing 0  Restless 0  Easily annoyed or irritable 0  Afraid - awful might happen 0  Total GAD 7 Score 2  Anxiety Difficulty Not difficult at all   Interventions: A chart review was conducted prior to the clinical intake interview. The MMSE-2,  PHQ-9, and GAD-7 were administered and a clinical intake interview was completed. In addition, Erlene was asked to complete a Mood and Food questionnaire to assess various behaviors related to emotional eating. Throughout session, empathic reflections and validation was provided. Continuing treatment with this provider was discussed and a treatment goal was established. Psychoeducation regarding emotional versus physical hunger was provided. Mena was given a handout to utilize between now and the next appointment to increase awareness of hunger patterns and subsequent eating.   Provisional DSM-5 Diagnosis: 311 (F32.8) Other Specified Depressive Disorder, Emotional Eating  Plan: Bently expressed understanding and agreement with the initial treatment plan of care. She appears able and willing to participate as evidenced by collaboration on a treatment goal, engagement in reciprocal conversation, and asking questions as needed for clarification. The next appointment will be scheduled in two weeks. The following treatment goal was established: decrease emotional eating. For the aforementioned goal, Melvinia can benefit from biweekly sessions that are brief in duration for approximately four to six sessions.

## 2018-10-13 ENCOUNTER — Ambulatory Visit (INDEPENDENT_AMBULATORY_CARE_PROVIDER_SITE_OTHER): Payer: BLUE CROSS/BLUE SHIELD | Admitting: Family Medicine

## 2018-10-13 VITALS — BP 115/75 | HR 81 | Temp 99.0°F | Ht 67.0 in | Wt 235.0 lb

## 2018-10-13 DIAGNOSIS — Z6836 Body mass index (BMI) 36.0-36.9, adult: Secondary | ICD-10-CM

## 2018-10-13 DIAGNOSIS — Z9189 Other specified personal risk factors, not elsewhere classified: Secondary | ICD-10-CM

## 2018-10-13 DIAGNOSIS — R7303 Prediabetes: Secondary | ICD-10-CM | POA: Diagnosis not present

## 2018-10-13 DIAGNOSIS — E559 Vitamin D deficiency, unspecified: Secondary | ICD-10-CM | POA: Diagnosis not present

## 2018-10-13 MED ORDER — VITAMIN D (ERGOCALCIFEROL) 1.25 MG (50000 UNIT) PO CAPS: 50000.0000 [IU] | ORAL_CAPSULE | ORAL | 0 refills | Status: DC

## 2018-10-13 MED ORDER — METFORMIN HCL 500 MG PO TABS: 500.0000 mg | ORAL_TABLET | Freq: Every day | ORAL | 0 refills | Status: DC

## 2018-10-14 ENCOUNTER — Encounter (INDEPENDENT_AMBULATORY_CARE_PROVIDER_SITE_OTHER): Payer: Self-pay | Admitting: Family Medicine

## 2018-10-14 LAB — COMPREHENSIVE METABOLIC PANEL
A/G RATIO: 1.7 (ref 1.2–2.2)
ALK PHOS: 69 IU/L (ref 39–117)
ALT: 9 IU/L (ref 0–32)
AST: 12 IU/L (ref 0–40)
Albumin: 3.9 g/dL (ref 3.5–5.5)
BUN/Creatinine Ratio: 8 — ABNORMAL LOW (ref 9–23)
BUN: 9 mg/dL (ref 6–24)
Bilirubin Total: 0.2 mg/dL (ref 0.0–1.2)
CALCIUM: 8.9 mg/dL (ref 8.7–10.2)
CHLORIDE: 103 mmol/L (ref 96–106)
CO2: 22 mmol/L (ref 20–29)
Creatinine, Ser: 1.07 mg/dL — ABNORMAL HIGH (ref 0.57–1.00)
GFR calc Af Amer: 71 mL/min/{1.73_m2} (ref >59)
GFR, EST NON AFRICAN AMERICAN: 62 mL/min/{1.73_m2} (ref >59)
Globulin, Total: 2.3 g/dL (ref 1.5–4.5)
Glucose: 96 mg/dL (ref 65–99)
POTASSIUM: 4.4 mmol/L (ref 3.5–5.2)
Sodium: 140 mmol/L (ref 134–144)
Total Protein: 6.2 g/dL (ref 6.0–8.5)

## 2018-10-14 LAB — HEMOGLOBIN A1C
ESTIMATED AVERAGE GLUCOSE: 120 mg/dL
HEMOGLOBIN A1C: 5.8 % — AB (ref 4.8–5.6)

## 2018-10-14 LAB — INSULIN, RANDOM: INSULIN: 17.4 u[IU]/mL (ref 2.6–24.9)

## 2018-10-14 LAB — VITAMIN D 25 HYDROXY (VIT D DEFICIENCY, FRACTURES): Vit D, 25-Hydroxy: 49.5 ng/mL (ref 30.0–100.0)

## 2018-10-14 NOTE — Progress Notes (Signed)
Office: [phone]  /  Fax: [phone]   Date: October 22, 2018 Time Seen: 4:00pm Duration: 30 minutes Provider: Lawerance Cruel, Psy.D. Type of Session: Individual Therapy   HPI: Andre was referred by Dr. Quillian Quince due to depression with emotional eating behaviors. She was seen for an initial appointment with this provider on October 01, 2018. Per the note for the last visit with Dr. Quillian Quince on September 22, 2018, "[name] struggling with emotional eating and using food for comfort to the extent that it is negatively impacting herhealth. Sheoften snacks when sheis not hungry. Susannesometimes feels sheis out of control and then feels guilty that shemade poor food choices. Lavetta Nielsen been working on behavior modification techniques to help reduce heremotional eating and has been somewhat successful.She has an appointment with psychologist next week.Sheshows no sign of suicidal or homicidal ideations. She denies dry mouth." In addition, per the note for the initial visit with Dr. Quillian Quince on May 14, 2018, Resha shared she has been heavy most of her life and started gaining weight at age 89. Her heaviest weight ever was 255 pounds. During her initial appointment Dr. Dalbert Garnet, Lynnae Sandhoff reported experiencing the following: significant food cravings issues; frequently making poor food choices; frequently eating larger portions than normal; and struggling with emotional eating. During the initial appointment with this provider, Kinaya reported that she informed the dietician that logging food is "stressful" and therefore, was reportedly recommended to see this provider. She indicated the last episode of emotional eating was "earlier this week." Bryli indicated that she went and "got the breakfast" she wanted to eat. Regarding her current prescribed meal plan, Sigrid noted, "I am really encouraged by it." Danijela reported emotional eating started in childhood. She denied a history  of binge eating. Nasiah denied a history of purging and engagement in any other compensatory strategies. She denied ever being diagnosed with an eating disorder. Moreover, Yashika indicated that her focus on weight and food likely started when her mother started focusing on it during childhood. She indicated she was rewarded for reaching certain weight goals. Furthermore, Abiha was asked to complete a questionnaire assessing various behaviors related to emotional eating. Maree endorsed the following: eat certain foods when you are anxious, stressed, depressed, or your feelings are hurt, find food is comforting to you and eat as a reward.  Session Content: Session focused on the following treatment goal: decrease emotional eating. The session was initiated with the administration of the PHQ-9 and GAD-7, as well as a brief check-in. Sulay shared that she has begun to recognize when she experiences emotional hunger.  She acknowledged difficulty with breakfast; therefore, this was explored further. She identified morning stressors and also noted that she views her breakfast time as "me time." As such, this provider engaged Alannie in problem solving to assist her in being successful with reaching her goals related to eating, but also having time for herself. She also expressed desire to start swimming again. Psychoeducation regarding triggers for emotional eating was provided. Bianey was provided a handout, and encouraged to utilize the handout between now and the next appointment to increase awareness of triggers and frequency. Lynnae Sandhoff agreed. Overall, Alanta was receptive to today's appointment as evidenced by her openness to sharing, responsiveness to feedback, and willingness to identify her triggers for emotional eating.  Mental Status Examination: Issabelle arrived on time for the appointment. She presented as appropriately dressed and groomed. Seanna appeared her stated age and demonstrated adequate  orientation to time, place, person, and  purpose of the appointment. She also demonstrated appropriate eye contact. No psychomotor abnormalities or behavioral peculiarities noted. Her mood was euthymic with congruent affect. Her thought processes were logical, linear, and goal-directed. No hallucinations, delusions, bizarre thinking or behavior reported or observed. Judgment, insight, and impulse control appeared to be grossly intact. There was no evidence of paraphasias (i.e., errors in speech, gross mispronunciations, and word substitutions), repetition deficits, or disturbances in volume or prosody (i.e., rhythm and intonation). There was no evidence of attention or memory impairments. Beau denied current suicidal and homicidal ideation, intent or plan.  Structured Assessment Results: The Patient Health Questionnaire-9 (PHQ-9) is a self-report measure that assesses symptoms and severity of depression over the course of the last two weeks. Meegan obtained a score of four suggesting minimal depression. Samamtha finds the endorsed symptoms to be not difficult at all. Depression screen PHQ 2/9 10/22/2018  Decreased Interest 0  Down, Depressed, Hopeless 0  PHQ - 2 Score 0  Altered sleeping 1  Tired, decreased energy 1  Change in appetite 1  Feeling bad or failure about yourself  1  Trouble concentrating 0  Moving slowly or fidgety/restless 0  Suicidal thoughts 0  PHQ-9 Score 4  Difficult doing work/chores -   The Generalized Anxiety Disorder-7 (GAD-7) is a brief self-report measure that assesses symptoms of anxiety over the course of the last two weeks. Ellamay obtained a score of three suggesting minimal anxiety. GAD 7 : Generalized Anxiety Score 10/22/2018  Nervous, Anxious, on Edge 1  Control/stop worrying 0  Worry too much - different things 1  Trouble relaxing 1  Restless 0  Easily annoyed or irritable 0  Afraid - awful might happen 0  Total GAD 7 Score 3  Anxiety Difficulty Not  difficult at all   Interventions: Rowyn was administered the PHQ-9 and GAD-7 for symptom monitoring. Content from the last session was reviewed. Throughout today's session, empathic reflections and validation were provided. Veva was engaged in problem solving and psychoeducation regarding triggers for emotional eating was provided.  DSM-5 Diagnosis: 311 (F32.8) Other Specified Depressive Disorder, Emotional Eating  Treatment Goal & Progress: Carinna was seen for an initial appointment with this provider on October 01, 2018 during which the following treatment goal was established: decrease emotional eating. Maleta has demonstrated progress in her goal of decreasing emotional eating as evidenced by her increased awareness of hunger patterns and willingness to identify her triggers for emotional eating.  Plan: Whittley continues to appear able and willing to participate as evidenced by engagement in reciprocal conversation, and asking questions for clarification as appropriate. The next appointment will be scheduled in two weeks. The next session will focus on reviewing triggers for emotional eating and the introduction of thought defusion.

## 2018-10-14 NOTE — Progress Notes (Signed)
Office: [phone]  /  Fax: [phone]   HPI:   Chief Complaint: OBESITY Katelyn Walker is here to discuss her progress with her obesity treatment plan. She is following the Category 2 plan with breakfast options. Katelyn Walker is following her eating plan approximately 80 % of the time. She states she is walking 30 minutes 1 time per week. Katelyn Walker continues to do well with weight loss on her Cat 2 plan. She is working on decreasing her emotional eating. Katelyn Walker has done well recognizing when she is comfort eating vs hunger eating.  Her weight is 235 lb (106.6 kg) today and has had a weight loss of 1 pound over a period of 3 weeks since her last visit. She has lost 5 lbs since starting treatment with [name].  Pre-Diabetes Katelyn Walker has a diagnosis of prediabetes based on her elevated HgA1c and was informed this puts her at greater risk of developing diabetes. She is stable taking metformin and continues to work on diet and exercise to decrease risk of diabetes. She denies nausea or hypoglycemia.  At risk for diabetes Katelyn Walker is at higher than averagerisk for developing diabetes due to her obesity. She currently denies polyuria or polydipsia.  Vitamin D deficiency Katelyn Walker has a diagnosis of vitamin D deficiency. She is stable on presscription Vit D.  Katelyn Walker denies nausea, vomiting or muscle weakness.    ALLERGIES: Allergies  Allergen Reactions  . Contrave [Naltrexone-Bupropion Hcl Er] Other (See Comments)    Vertigo/ dizziness    MEDICATIONS: Current Outpatient Medications on File Prior to Visit  Medication Sig Dispense Refill  . buPROPion (WELLBUTRIN SR) 200 MG 12 hr tablet Take 1 tablet (200 mg total) by mouth 2 (two) times daily. 60 tablet 0  . levothyroxine (SYNTHROID, LEVOTHROID) 75 MCG tablet Take 75 mcg by mouth daily before breakfast.    . Norethindrone Acetate-Ethinyl Estrad-FE (BLISOVI 24 FE) 1-20 MG-MCG(24) tablet Take 1 tablet by mouth daily.    Marland Kitchen omeprazole (PRILOSEC) 40 MG  capsule Take 40 mg by mouth daily.    . valsartan (DIOVAN) 160 MG tablet Take 160 mg by mouth daily.    Marland Kitchen zolpidem (AMBIEN) 10 MG tablet Take 10 mg by mouth at bedtime as needed for sleep.     No current facility-administered medications on file prior to visit.     PAST MEDICAL HISTORY: Past Medical History:  Diagnosis Date  . Back pain   . Dyspnea   . Elevated blood pressure reading without diagnosis of hypertension   . Esophageal ulcer   . GERD (gastroesophageal reflux disease)   . Hot flashes   . Hypothyroid   . Joint pain   . Sciatica, left side   . Vitamin B 12 deficiency   . Vitamin D deficiency     PAST SURGICAL HISTORY: Past Surgical History:  Procedure Laterality Date  . DILATION AND CURETTAGE OF UTERUS     2011   . OVARIAN CYST SURGERY     2009, 2013    SOCIAL HISTORY: Social History   Tobacco Use  . Smoking status: Never Smoker  . Smokeless tobacco: Never Used  Substance Use Topics  . Alcohol use: Not on file  . Drug use: Not on file    FAMILY HISTORY: Family History  Problem Relation Age of Onset  . Alcoholism Mother   . Eating disorder Mother   . Diabetes Father   . Hypertension Father   . Sleep apnea Father     ROS: Review of Systems  Constitutional:  Negative for weight loss.  Gastrointestinal: Negative for nausea and vomiting.  Musculoskeletal:       Negative for muscle weakness  Endo/Heme/Allergies: Negative for polydipsia.       Negative for hypoglycemia Negative polyuria    PHYSICAL EXAM: Blood pressure 115/75, pulse 81, temperature 99 F (37.2 C), temperature source Oral, height 5\' 7"  (1.702 m), weight 235 lb (106.6 kg), SpO2 98 %. Body mass index is 36.81 kg/m. Physical Exam  Constitutional: She is oriented to person, place, and time. She appears well-developed and well-nourished.  Cardiovascular: Normal rate.  Pulmonary/Chest: Effort normal.  Musculoskeletal: Normal range of motion.  Neurological: She is alert and oriented  to person, place, and time.  Skin: Skin is warm and dry.  Psychiatric: She has a normal mood and affect. Her behavior is normal.  Vitals reviewed.   RECENT LABS AND TESTS: BMET    Component Value Date/Time   NA 140 10/13/2018 1002   K 4.4 10/13/2018 1002   CL 103 10/13/2018 1002   CO2 22 10/13/2018 1002   GLUCOSE 96 10/13/2018 1002   BUN 9 10/13/2018 1002   CREATININE 1.07 (H) 10/13/2018 1002   CALCIUM 8.9 10/13/2018 1002   GFRNONAA 62 10/13/2018 1002   GFRAA 71 10/13/2018 1002   Lab Results  Component Value Date   HGBA1C 5.8 (H) 10/13/2018   HGBA1C 5.8 (H) 05/14/2018   Lab Results  Component Value Date   INSULIN 17.4 10/13/2018   INSULIN 18.3 05/14/2018   CBC    Component Value Date/Time   WBC 8.7 05/14/2018 1138   WBC 13.7 (H) 09/09/2007 0546   RBC 4.96 05/14/2018 1138   RBC 3.11 (L) 09/09/2007 0546   HGB 12.0 05/14/2018 1138   HCT 38.8 05/14/2018 1138   PLT 197 09/09/2007 0546   MCV 78 (L) 05/14/2018 1138   MCH 24.2 (L) 05/14/2018 1138   MCHC 30.9 (L) 05/14/2018 1138   MCHC 33.6 09/09/2007 0546   RDW 16.7 (H) 05/14/2018 1138   LYMPHSABS 1.8 05/14/2018 1138   EOSABS 0.1 05/14/2018 1138   BASOSABS 0.0 05/14/2018 1138   Iron/TIBC/Ferritin/ %Sat No results found for: IRON, TIBC, FERRITIN, IRONPCTSAT Lipid Panel     Component Value Date/Time   CHOL 171 05/14/2018 1138   TRIG 145 05/14/2018 1138   HDL 44 05/14/2018 1138   LDLCALC 98 05/14/2018 1138   Hepatic Function Panel     Component Value Date/Time   PROT 6.2 10/13/2018 1002   ALBUMIN 3.9 10/13/2018 1002   AST 12 10/13/2018 1002   ALT 9 10/13/2018 1002   ALKPHOS 69 10/13/2018 1002   BILITOT 0.2 10/13/2018 1002      Component Value Date/Time   TSH 2.830 05/14/2018 1138   Results for GARNET, CHATMON (MRN [id number]) as of 10/14/2018 12:48  Ref. Range 10/13/2018 10:02  Vitamin D, 25-Hydroxy Latest Ref Range: 30.0 - 100.0 ng/mL 49.5   ASSESSMENT AND PLAN: Prediabetes - Plan:  Comprehensive metabolic panel, Hemoglobin A1c, Insulin, random, metFORMIN (GLUCOPHAGE) 500 MG tablet  Vitamin D deficiency - Plan: VITAMIN D 25 Hydroxy (Vit-D Deficiency, Fractures), Vitamin D, Ergocalciferol, (DRISDOL) 50000 units CAPS capsule  At risk for diabetes mellitus  Class 2 severe obesity with serious comorbidity and body mass index (BMI) of 36.0 to 36.9 in adult, unspecified obesity type [facility])  PLAN: Pre-Diabetes Sharnika will continue to work on weight loss, exercise, and decreasing simple carbohydrates in her diet to help decrease the risk of diabetes. We dicussed metformin including benefits and  risks. She was informed that eating too many simple carbohydrates or too many calories at one sitting increases the likelihood of GI side effects. Melenie agrees to continue taking Metformin 500 mg qd with breakfast #30 with no refills.  Lashaye agrees to follow up with our office in 3 weeks.  Diabetes risk counselling Lydiana was given extended (15 minutes) diabetes prevention counseling today. She is 47 y.o. female and has risk factors for diabetes including obesity. We discussed intensive lifestyle modifications today with an emphasis on weight loss as well as increasing exercise and decreasing simple carbohydrates in her diet.  Vitamin D Deficiency Gayla was informed that low vitamin D levels contributes to fatigue and are associated with obesity, breast, and colon cancer. She agrees to continue to take prescription Vit D @50 ,000 IU every week #4 with no refills.  She will follow up for routine testing of vitamin D, at least 2-3 times per year. She was informed of the risk of over-replacement of vitamin D and agrees to not increase her dose unless she discusses this with [name] first. Saesha agrees to follow up in our office in 3 weeks.   Obesity Lynnex is currently in the action stage of change. As such, her goal is to continue with weight loss efforts She has agreed to follow the  Category 2 plan Lya has been instructed to work up to a goal of 150 minutes of combined cardio and strengthening exercise per week for weight loss and overall health benefits. We discussed the following Behavioral Modification Strategies today: increasing lean protein intake, decreasing simple carbohydrates , holiday eating strategies  and emotional eating strategies  Narcisa has agreed to follow up with our clinic in 3 weeks. She was informed of the importance of frequent follow up visits to maximize her success with intensive lifestyle modifications for her multiple health conditions.   OBESITY BEHAVIORAL INTERVENTION VISIT  Today's visit was # 8   Starting weight: 242 lbs Starting date: 05/14/2018 Today's weight : Weight: 235 lb (106.6 kg)  Today's date: 10/13/2018 Total lbs lost to date: 5 lbs   ASK: We discussed the diagnosis of obesity with Gevena Barre today and Shauntee agreed to give [name] permission to discuss obesity behavioral modification therapy today.  ASSESS: Adalaya has the diagnosis of obesity and her BMI today is 36.8 Satsuki is in the action stage of change   ADVISE: Desani was educated on the multiple health risks of obesity as well as the benefit of weight loss to improve her health. She was advised of the need for long term treatment and the importance of lifestyle modifications to improve her current health and to decrease her risk of future health problems.  AGREE: Multiple dietary modification options and treatment options were discussed and  Aileana agreed to follow the recommendations documented in the above note.  ARRANGE: Dandria was educated on the importance of frequent visits to treat obesity as outlined per CMS and USPSTF guidelines and agreed to schedule her next follow up appointment today.  I, Ellery Plunk, CMA, am acting as transcriptionist for Quillian Quince, MD  I have reviewed the above documentation for accuracy and completeness,  and I agree with the above. -Quillian Quince, MD

## 2018-10-22 ENCOUNTER — Ambulatory Visit (INDEPENDENT_AMBULATORY_CARE_PROVIDER_SITE_OTHER): Payer: BLUE CROSS/BLUE SHIELD | Admitting: Psychology

## 2018-10-22 DIAGNOSIS — F3289 Other specified depressive episodes: Secondary | ICD-10-CM | POA: Diagnosis not present

## 2018-10-29 ENCOUNTER — Other Ambulatory Visit (INDEPENDENT_AMBULATORY_CARE_PROVIDER_SITE_OTHER): Payer: Self-pay | Admitting: Family Medicine

## 2018-10-29 DIAGNOSIS — F3289 Other specified depressive episodes: Secondary | ICD-10-CM

## 2018-10-30 ENCOUNTER — Other Ambulatory Visit (INDEPENDENT_AMBULATORY_CARE_PROVIDER_SITE_OTHER): Payer: Self-pay

## 2018-10-30 DIAGNOSIS — F3289 Other specified depressive episodes: Secondary | ICD-10-CM

## 2018-10-30 MED ORDER — BUPROPION HCL ER (SR) 200 MG PO TB12: 200.0000 mg | ORAL_TABLET | Freq: Two times a day (BID) | ORAL | 0 refills | Status: DC

## 2018-11-03 ENCOUNTER — Ambulatory Visit (INDEPENDENT_AMBULATORY_CARE_PROVIDER_SITE_OTHER): Payer: Self-pay | Admitting: Psychology

## 2018-11-03 ENCOUNTER — Ambulatory Visit (INDEPENDENT_AMBULATORY_CARE_PROVIDER_SITE_OTHER): Payer: BLUE CROSS/BLUE SHIELD | Admitting: Family Medicine

## 2018-11-03 ENCOUNTER — Encounter (INDEPENDENT_AMBULATORY_CARE_PROVIDER_SITE_OTHER): Payer: Self-pay

## 2018-11-03 VITALS — BP 122/79 | HR 72 | Temp 98.4°F | Ht 67.0 in | Wt 236.0 lb

## 2018-11-03 DIAGNOSIS — Z9189 Other specified personal risk factors, not elsewhere classified: Secondary | ICD-10-CM | POA: Diagnosis not present

## 2018-11-03 DIAGNOSIS — R7303 Prediabetes: Secondary | ICD-10-CM | POA: Diagnosis not present

## 2018-11-03 DIAGNOSIS — E6609 Other obesity due to excess calories: Secondary | ICD-10-CM | POA: Diagnosis not present

## 2018-11-03 DIAGNOSIS — E559 Vitamin D deficiency, unspecified: Secondary | ICD-10-CM

## 2018-11-03 DIAGNOSIS — E039 Hypothyroidism, unspecified: Secondary | ICD-10-CM | POA: Diagnosis not present

## 2018-11-03 DIAGNOSIS — Z6837 Body mass index (BMI) 37.0-37.9, adult: Secondary | ICD-10-CM

## 2018-11-03 DIAGNOSIS — F325 Major depressive disorder, single episode, in full remission: Secondary | ICD-10-CM | POA: Diagnosis not present

## 2018-11-03 DIAGNOSIS — I10 Essential (primary) hypertension: Secondary | ICD-10-CM | POA: Diagnosis not present

## 2018-11-03 MED ORDER — VITAMIN D (ERGOCALCIFEROL) 1.25 MG (50000 UNIT) PO CAPS: 50000.0000 [IU] | ORAL_CAPSULE | ORAL | 0 refills | Status: DC

## 2018-11-03 MED ORDER — METFORMIN HCL 500 MG PO TABS: 500.0000 mg | ORAL_TABLET | Freq: Every day | ORAL | 0 refills | Status: DC

## 2018-11-05 NOTE — Progress Notes (Signed)
Office: [phone]  /  Fax: [phone]   HPI:   Chief Complaint: OBESITY Katelyn Walker is here to discuss her progress with her obesity treatment plan. She is on the Category 2 plan and is following her eating plan approximately 50 % of the time. She states she is walking for 30 minutes 1 time per week. Katelyn Walker has had increased temptations and emotional eating over the last 2 to 3 weeks. She is ready to get back on track. She has questions about Thanksgiving strategies.  Her weight is 236 lb (107 kg) today and has gained 1 pound since her last visit. She has lost 6 lbs since starting treatment with [name].  Pre-Diabetes Katelyn Walker has a diagnosis of pre-diabetes based on her elevated Hgb A1c and was informed this puts her at greater risk of developing diabetes. Sheis stable on metformin and she is working on diet and exercise to decrease risk of diabetes. She denies nausea, vomiting, or hypoglycemia.  At risk for diabetes Katelyn Walker is at higher than average risk for developing diabetes due to her obesity and pre-diabetes. She currently denies polyuria or polydipsia.  Vitamin D Deficiency Katelyn Walker has a diagnosis of vitamin D deficiency. She is stable on prescription Vit D and level is almost at goal. She denies nausea, vomiting or muscle weakness.  ALLERGIES: Allergies  Allergen Reactions  . Contrave [Naltrexone-Bupropion Hcl Er] Other (See Comments)    Vertigo/ dizziness    MEDICATIONS: Current Outpatient Medications on File Prior to Visit  Medication Sig Dispense Refill  . buPROPion (WELLBUTRIN SR) 200 MG 12 hr tablet Take 1 tablet (200 mg total) by mouth 2 (two) times daily. 60 tablet 0  . levothyroxine (SYNTHROID, LEVOTHROID) 75 MCG tablet Take 75 mcg by mouth daily before breakfast.    . Norethindrone Acetate-Ethinyl Estrad-FE (BLISOVI 24 FE) 1-20 MG-MCG(24) tablet Take 1 tablet by mouth daily.    Marland Kitchen omeprazole (PRILOSEC) 40 MG capsule Take 40 mg by mouth daily.    . valsartan (DIOVAN)  160 MG tablet Take 160 mg by mouth daily.    Marland Kitchen zolpidem (AMBIEN) 10 MG tablet Take 10 mg by mouth at bedtime as needed for sleep.     No current facility-administered medications on file prior to visit.     PAST MEDICAL HISTORY: Past Medical History:  Diagnosis Date  . Back pain   . Dyspnea   . Elevated blood pressure reading without diagnosis of hypertension   . Esophageal ulcer   . GERD (gastroesophageal reflux disease)   . Hot flashes   . Hypothyroid   . Joint pain   . Sciatica, left side   . Vitamin B 12 deficiency   . Vitamin D deficiency     PAST SURGICAL HISTORY: Past Surgical History:  Procedure Laterality Date  . DILATION AND CURETTAGE OF UTERUS     2011   . OVARIAN CYST SURGERY     2009, 2013    SOCIAL HISTORY: Social History   Tobacco Use  . Smoking status: Never Smoker  . Smokeless tobacco: Never Used  Substance Use Topics  . Alcohol use: Not on file  . Drug use: Not on file    FAMILY HISTORY: Family History  Problem Relation Age of Onset  . Alcoholism Mother   . Eating disorder Mother   . Diabetes Father   . Hypertension Father   . Sleep apnea Father     ROS: Review of Systems  Constitutional: Negative for weight loss.  Gastrointestinal: Negative for nausea and  vomiting.  Genitourinary: Negative for frequency.  Musculoskeletal:       Negative muscle weakness  Endo/Heme/Allergies: Negative for polydipsia.       Negative hypoglycemia    PHYSICAL EXAM: Blood pressure 122/79, pulse 72, temperature 98.4 F (36.9 C), temperature source Oral, height 5\' 7"  (1.702 m), weight 236 lb (107 kg), SpO2 100 %. Body mass index is 36.96 kg/m. Physical Exam  Constitutional: She is oriented to person, place, and time. She appears well-developed and well-nourished.  Cardiovascular: Normal rate.  Pulmonary/Chest: Effort normal.  Neurological: She is oriented to person, place, and time.  Skin: Skin is warm and dry.  Psychiatric: She has a normal mood  and affect. Her behavior is normal.    RECENT LABS AND TESTS: BMET    Component Value Date/Time   NA 140 10/13/2018 1002   K 4.4 10/13/2018 1002   CL 103 10/13/2018 1002   CO2 22 10/13/2018 1002   GLUCOSE 96 10/13/2018 1002   BUN 9 10/13/2018 1002   CREATININE 1.07 (H) 10/13/2018 1002   CALCIUM 8.9 10/13/2018 1002   GFRNONAA 62 10/13/2018 1002   GFRAA 71 10/13/2018 1002   Lab Results  Component Value Date   HGBA1C 5.8 (H) 10/13/2018   HGBA1C 5.8 (H) 05/14/2018   Lab Results  Component Value Date   INSULIN 17.4 10/13/2018   INSULIN 18.3 05/14/2018   CBC    Component Value Date/Time   WBC 8.7 05/14/2018 1138   WBC 13.7 (H) 09/09/2007 0546   RBC 4.96 05/14/2018 1138   RBC 3.11 (L) 09/09/2007 0546   HGB 12.0 05/14/2018 1138   HCT 38.8 05/14/2018 1138   PLT 197 09/09/2007 0546   MCV 78 (L) 05/14/2018 1138   MCH 24.2 (L) 05/14/2018 1138   MCHC 30.9 (L) 05/14/2018 1138   MCHC 33.6 09/09/2007 0546   RDW 16.7 (H) 05/14/2018 1138   LYMPHSABS 1.8 05/14/2018 1138   EOSABS 0.1 05/14/2018 1138   BASOSABS 0.0 05/14/2018 1138   Iron/TIBC/Ferritin/ %Sat No results found for: IRON, TIBC, FERRITIN, IRONPCTSAT Lipid Panel     Component Value Date/Time   CHOL 171 05/14/2018 1138   TRIG 145 05/14/2018 1138   HDL 44 05/14/2018 1138   LDLCALC 98 05/14/2018 1138   Hepatic Function Panel     Component Value Date/Time   PROT 6.2 10/13/2018 1002   ALBUMIN 3.9 10/13/2018 1002   AST 12 10/13/2018 1002   ALT 9 10/13/2018 1002   ALKPHOS 69 10/13/2018 1002   BILITOT 0.2 10/13/2018 1002      Component Value Date/Time   TSH 2.830 05/14/2018 1138  Results for Katelyn BarreCRIHFIELD, Katelyn Walker (MRN [id number]) as of 11/05/2018 10:37  Ref. Range 10/13/2018 10:02  Vitamin D, 25-Hydroxy Latest Ref Range: 30.0 - 100.0 ng/mL 49.5    ASSESSMENT AND PLAN: Prediabetes - Plan: metFORMIN (GLUCOPHAGE) 500 MG tablet  Vitamin D deficiency - Plan: Vitamin D, Ergocalciferol, (DRISDOL) 1.25 MG (50000 UT)  CAPS capsule  At risk for diabetes mellitus  Class 2 severe obesity with serious comorbidity and body mass index (BMI) of 37.0 to 37.9 in adult, unspecified obesity type [facility])  PLAN:  Pre-Diabetes Katelyn SandhoffSusanne will continue to work on weight loss, diet, exercise, and decreasing simple carbohydrates in her diet to help decrease the risk of diabetes. We dicussed metformin including benefits and risks. She was informed that eating too many simple carbohydrates or too many calories at one sitting increases the likelihood of GI side effects. Katelyn SandhoffSusanne agrees to continue taking  metformin 500 mg q AM #30 and we will refill for 1 month. Katelyn Walker agrees to follow up with our clinic in 3 weeks as directed to monitor her progress.  Diabetes risk counselling Katelyn Walker was given extended (15 minutes) diabetes prevention counseling today. She is 47 y.o. female and has risk factors for diabetes including obesity and pre-diabetes. We discussed intensive lifestyle modifications today with an emphasis on weight loss as well as increasing exercise and decreasing simple carbohydrates in her diet.  Vitamin D Deficiency Katelyn Walker was informed that low vitamin D levels contributes to fatigue and are associated with obesity, breast, and colon cancer. Katelyn Walker agrees to continue taking prescription Vit D @50 ,000 IU every week #4 and we will refill for 1 month. She will follow up for routine testing of vitamin D, at least 2-3 times per year. She was informed of the risk of over-replacement of vitamin D and agrees to not increase her dose unless she discusses this with [name] first. Katelyn Walker agrees to follow up with our clinic in 3 weeks.  Obesity Katelyn Walker is currently in the action stage of change. As such, her goal is to continue with weight loss efforts She has agreed to follow the Category 2 plan Katelyn Walker has been instructed to work up to a goal of 150 minutes of combined cardio and strengthening exercise per week for weight loss and  overall health benefits. We discussed the following Behavioral Modification Strategies today: increasing lean protein intake, decreasing simple carbohydrates , work on meal planning and easy cooking plans and holiday eating strategies    Katelyn Walker has agreed to follow up with our clinic in 3 weeks. She was informed of the importance of frequent follow up visits to maximize her success with intensive lifestyle modifications for her multiple health conditions.   OBESITY BEHAVIORAL INTERVENTION VISIT  Today's visit was # 9   Starting weight: 242 lbs Starting date: 05/14/18 Today's weight : 236 lbs Today's date: 11/03/2018 Total lbs lost to date: 6    ASK: We discussed the diagnosis of obesity with Katelyn Walker today and Katelyn Walker agreed to give [name] permission to discuss obesity behavioral modification therapy today.  ASSESS: Katelyn Walker has the diagnosis of obesity and her BMI today is 36.95 Katelyn Walker is in the action stage of change   ADVISE: Katelyn Walker was educated on the multiple health risks of obesity as well as the benefit of weight loss to improve her health. She was advised of the need for long term treatment and the importance of lifestyle modifications to improve her current health and to decrease her risk of future health problems.  AGREE: Multiple dietary modification options and treatment options were discussed and  Katelyn Walker agreed to follow the recommendations documented in the above note.  ARRANGE: Katelyn Walker was educated on the importance of frequent visits to treat obesity as outlined per CMS and USPSTF guidelines and agreed to schedule her next follow up appointment today.  I, Burt Knack, am acting as transcriptionist for Quillian Quince, MD  I have reviewed the above documentation for accuracy and completeness, and I agree with the above. -Quillian Quince, MD

## 2018-11-23 DIAGNOSIS — Z981 Arthrodesis status: Secondary | ICD-10-CM | POA: Diagnosis not present

## 2018-11-25 ENCOUNTER — Ambulatory Visit (INDEPENDENT_AMBULATORY_CARE_PROVIDER_SITE_OTHER): Payer: BLUE CROSS/BLUE SHIELD | Admitting: Family Medicine

## 2018-11-25 ENCOUNTER — Encounter (INDEPENDENT_AMBULATORY_CARE_PROVIDER_SITE_OTHER): Payer: Self-pay | Admitting: Family Medicine

## 2018-11-25 ENCOUNTER — Ambulatory Visit (INDEPENDENT_AMBULATORY_CARE_PROVIDER_SITE_OTHER): Payer: BLUE CROSS/BLUE SHIELD | Admitting: Psychology

## 2018-11-25 VITALS — BP 133/84 | HR 86 | Temp 98.0°F | Ht 67.0 in | Wt 234.0 lb

## 2018-11-25 DIAGNOSIS — Z6836 Body mass index (BMI) 36.0-36.9, adult: Secondary | ICD-10-CM

## 2018-11-25 DIAGNOSIS — Z9189 Other specified personal risk factors, not elsewhere classified: Secondary | ICD-10-CM

## 2018-11-25 DIAGNOSIS — E559 Vitamin D deficiency, unspecified: Secondary | ICD-10-CM

## 2018-11-25 DIAGNOSIS — R7303 Prediabetes: Secondary | ICD-10-CM | POA: Diagnosis not present

## 2018-11-25 DIAGNOSIS — F3289 Other specified depressive episodes: Secondary | ICD-10-CM

## 2018-11-25 MED ORDER — BUPROPION HCL ER (SR) 200 MG PO TB12: 200.0000 mg | ORAL_TABLET | Freq: Two times a day (BID) | ORAL | 0 refills | Status: DC

## 2018-11-25 MED ORDER — METFORMIN HCL 500 MG PO TABS: 500.0000 mg | ORAL_TABLET | Freq: Every day | ORAL | 0 refills | Status: DC

## 2018-11-25 MED ORDER — VITAMIN D (ERGOCALCIFEROL) 1.25 MG (50000 UNIT) PO CAPS: 50000.0000 [IU] | ORAL_CAPSULE | ORAL | 0 refills | Status: DC

## 2018-11-25 NOTE — Progress Notes (Signed)
Office: [phone]  /  Fax: [phone]   Date: November 25, 2018 Time Seen: 4:00pm Duration: 30 minutes Provider: Lawerance CruelGaytri Makynlee Kressin, Psy.D. Type of Session: Individual Therapy   HPI: [name] referred by Dr. Lelon Mastaren Beasleydue to depression with emotional eating behaviors. She was seen for an initial appointment with this provider on October 01, 2018. Per the note for thelastvisit withDr. Lamar Benesaren Beasleyon October7, 2019,"[name] struggling with emotional eating and using food for comfort to the extent that it is negatively impacting herhealth. Sheoften snacks when sheis not hungry. Susannesometimes feels sheis out of control and then feels guilty that shemade poor food choices. Katelyn NielsenShehas been working on behavior modification techniques to help reduce heremotional eating and has been somewhat successful.She has an appointment with psychologist next week.Sheshows no sign of suicidal or homicidal ideations. She denies dry mouth."In addition, per the note for the initial visit withDr. Darrel Hooveraren Beasleyon May 14, 2018,Susanneshared she has been heavy most of her life and started gaining weight at age 47. Her heaviest weight ever was 255 pounds. During her initial appointment Dr. Dalbert GarnetBeasley, Susannereported experiencing the following: significant food cravings issues; frequently making poor food choices; frequently eating larger portions than normal; and struggling with emotional eating.During the initial appointment with this provider, Katelyn Walker reported that she informed the dietician that logging food is "stressful" and therefore, was reportedly recommended to see this provider. She indicated the last episode of emotional eating was "earlier this week." Katelyn Walker indicated that she went and "got the breakfast" she wanted to eat. Regarding her current prescribed meal plan, Katelyn Walker noted, "I am really encouraged by it." Katelyn Walker reported emotional eating started in childhood. She denied a history  of binge eating. Katelyn Walker denied a history of purging and engagement in any other compensatory strategies. She denied ever being diagnosed with an eating disorder. Moreover, Katelyn Walker indicated that her focus on weight and food likely started when her mother started focusing on it during childhood. She indicated she was rewarded for reaching certain weight goals.Furthermore, [name] to complete a questionnaire assessing various behaviors related to emotional eating. Susanneendorsed the following: eat certain foods when you are anxious, stressed, depressed, or your feelings are hurt, find food is comforting to you and eat as a reward.  Session Content: Session focused on the following treatment goal: decrease emotional eating. The session was initiated with the administration of the PHQ-9 and GAD-7, as well as a brief check-in. Katelyn Walker reported out of habit and stress tend to be  frequent triggers for emotional eating for her. For Thanksgiving, Katelyn Walker shared she utilized learned behavioral strategies resulting in her making better choices. It was recommended she utilize the same strategies for the upcoming holidays. In addition, Katelyn Walker described ongoing work stressors. The impact of the aforementioned on eating was explored. Katelyn Walker acknowledged using food as a reward due to the ongoing stressors. Today's appointment then focused on establishing other rewards for successes and accomplishments related to work tasks due to the ongoing stressors. In addition, psychoeducation regarding pleasurable activities, including its impact on emotional eating was provided. Katelyn Walker was provided with a handout with various options of pleasurable activities, and was encouraged to engage in different activities between now and the next appointment with this provider. Katelyn Walker agreed. Katelyn Walker was receptive to today's session as evidenced by openness to sharing, responsiveness to feedback, and willingness to engage in  pleasurable activities and establish a reward.   Mental Status Examination: Katelyn Walker arrived on time for the appointment. She presented as appropriately dressed and groomed. Katelyn Walker appeared  her stated age and demonstrated adequate orientation to time, place, person, and purpose of the appointment. She also demonstrated appropriate eye contact. No psychomotor abnormalities or behavioral peculiarities noted. Her mood was euthymic with congruent affect. Her thought processes were logical, linear, and goal-directed. No hallucinations, delusions, bizarre thinking or behavior reported or observed. Judgment, insight, and impulse control appeared to be grossly intact. There was no evidence of paraphasias (i.e., errors in speech, gross mispronunciations, and word substitutions), repetition deficits, or disturbances in volume or prosody (i.e., rhythm and intonation). There was no evidence of attention or memory impairments. Katelyn Walker denied current suicidal and homicidal ideation, intent or plan.  Structured Assessment Results: The Patient Health Questionnaire-9 (PHQ-9) is a self-report measure that assesses symptoms and severity of depression over the course of the last two weeks. Katelyn Walker obtained a score of 3 suggesting minimal depression. Katelyn Walker finds the endorsed symptoms to be not difficult at all. Depression screen PHQ 2/9 11/25/2018  Decreased Interest 0  Down, Depressed, Hopeless 0  PHQ - 2 Score 0  Altered sleeping 2  Tired, decreased energy 0  Change in appetite 0  Feeling bad or failure about yourself  1  Trouble concentrating 0  Moving slowly or fidgety/restless 0  Suicidal thoughts 0  PHQ-9 Score 3  Difficult doing work/chores -   The Generalized Anxiety Disorder-7 (GAD-7) is a brief self-report measure that assesses symptoms of anxiety over the course of the last two weeks. Katelyn Walker obtained a score of 1 suggesting minimal anxiety. GAD 7 : Generalized Anxiety Score 11/25/2018  Nervous,  Anxious, on Edge 1  Control/stop worrying 0  Worry too much - different things 0  Trouble relaxing 0  Restless 0  Easily annoyed or irritable 0  Afraid - awful might happen 0  Total GAD 7 Score 1  Anxiety Difficulty Not difficult at all   Interventions: Katelyn Walker was administered the PHQ-9 and GAD-7 for symptom monitoring. Content from the last session was reviewed. Throughout today's session, empathic reflections and validation were provided. Psychoeducation regarding reward setting and pleasurable activities was provided.  DSM-5 Diagnosis: 311 (F32.8) Other Specified Depressive Disorder, Emotional Eating Behaviors  Treatment Goal & Progress: Katelyn Walker was seen for an initial appointment with this provider on October 01, 2018 during which the following treatment goal was established: decrease emotional eating. Katelyn Walker has demonstrated progress in her goal as evidenced by increased awareness of hunger patterns and triggers for emotional eating. Katelyn Walker also demonstrated willingness to engage in learned skills.  Plan: Katelyn Walker continues to appear able and willing to participate as evidenced by engagement in reciprocal conversation, and asking questions for clarification as appropriate. The next appointment will be scheduled in one month due to the upcoming holidays, and Katelyn Walker's desire to coordinate appointments. The next session will focus on reviewing learned skills, and the introduction of thought defusion.

## 2018-11-26 NOTE — Progress Notes (Signed)
Office: [phone]  /  Fax: [phone]   HPI:   Chief Complaint: OBESITY Katelyn Walker is here to discuss her progress with her obesity treatment plan. She is on the Category 2 plan with a modified breakfast and is following her eating plan approximately 50 % of the time. She states she is walking 30 minutes 1 time per week. Katelyn Walker did very well over Thanksgiving. She notes feeling more mindful and overall making better healthier choices. Her hunger is controlled.  Her weight is 234 lb (106.1 kg) today and has had a weight loss of 2 pounds over a period of 3 weeks since her last visit. She has lost 8 lbs since starting treatment with us.  Vitamin D deficiency Katelyn Walker has a diagnosis of vitamin D deficiency. She is currently stable taking vit D and is close to goal. She admits that fatigue has improved and denies nausea, vomiting, or muscle weakness.  Pre-Diabetes Katelyn Walker has a diagnosis of pre-diabetes based on her elevated Hgb A1c and was informed this puts her at greater risk of developing diabetes. She is stable on metformin currently and is doing well with diet and exercise to decrease risk of diabetes. She denies nausea, vomiting, or hypoglycemia.  At risk for diabetes Katelyn Walker is at higher than average risk for developing diabetes due to her pre-diabetes and obesity. She currently denies polyuria or polydipsia.  Depression with emotional eating behaviors Katelyn Walker is struggling with emotional eating and using food for comfort to the extent that it is negatively impacting her health. She often snacks when she is not hungry. Katelyn Walker sometimes feels she is out of control and then feels guilty that she made poor food choices. She has been working on behavior modification techniques to help reduce her emotional eating and has been somewhat successful. She increased Wellbutrin to 200mg  and notes that her mood has improved with reduced emotional eating and she feels less  irritable.  ALLERGIES: Allergies  Allergen Reactions  . Contrave [Naltrexone-Bupropion Hcl Er] Other (See Comments)    Vertigo/ dizziness    MEDICATIONS: Current Outpatient Medications on File Prior to Visit  Medication Sig Dispense Refill  . levothyroxine (SYNTHROID, LEVOTHROID) 75 MCG tablet Take 75 mcg by mouth daily before breakfast.    . Norethindrone Acetate-Ethinyl Estrad-FE (BLISOVI 24 FE) 1-20 MG-MCG(24) tablet Take 1 tablet by mouth daily.    Marland Kitchen. omeprazole (PRILOSEC) 40 MG capsule Take 40 mg by mouth daily.    . valsartan (DIOVAN) 160 MG tablet Take 160 mg by mouth daily.    Marland Kitchen. zolpidem (AMBIEN) 10 MG tablet Take 10 mg by mouth at bedtime as needed for sleep.     No current facility-administered medications on file prior to visit.     PAST MEDICAL HISTORY: Past Medical History:  Diagnosis Date  . Back pain   . Dyspnea   . Elevated blood pressure reading without diagnosis of hypertension   . Esophageal ulcer   . GERD (gastroesophageal reflux disease)   . Hot flashes   . Hypothyroid   . Joint pain   . Sciatica, left side   . Vitamin B 12 deficiency   . Vitamin D deficiency     PAST SURGICAL HISTORY: Past Surgical History:  Procedure Laterality Date  . DILATION AND CURETTAGE OF UTERUS     2011   . OVARIAN CYST SURGERY     2009, 2013    SOCIAL HISTORY: Social History   Tobacco Use  . Smoking status: Never Smoker  . Smokeless  tobacco: Never Used  Substance Use Topics  . Alcohol use: Not on file  . Drug use: Not on file    FAMILY HISTORY: Family History  Problem Relation Age of Onset  . Alcoholism Mother   . Eating disorder Mother   . Diabetes Father   . Hypertension Father   . Sleep apnea Father     ROS: Review of Systems  Constitutional: Positive for malaise/fatigue and weight loss.  Gastrointestinal: Negative for nausea and vomiting.  Genitourinary:       Negative for polyuria.  Musculoskeletal:       Negative for muscle weakness.   Endo/Heme/Allergies: Negative for polydipsia.       Negative for hypoglycemia.  Psychiatric/Behavioral: Positive for depression.    PHYSICAL EXAM: Blood pressure 133/84, pulse 86, temperature 98 F (36.7 C), temperature source Oral, height 5\' 7"  (1.702 m), weight 234 lb (106.1 kg), SpO2 99 %. Body mass index is 36.65 kg/m. Physical Exam  Constitutional: She is oriented to person, place, and time. She appears well-developed and well-nourished.  Cardiovascular: Normal rate and regular rhythm.  Pulmonary/Chest: Effort normal.  Musculoskeletal: Normal range of motion.  Neurological: She is oriented to person, place, and time.  Skin: Skin is warm and dry.  Psychiatric: She has a normal mood and affect. Her behavior is normal.  Vitals reviewed.   RECENT LABS AND TESTS: BMET    Component Value Date/Time   NA 140 10/13/2018 1002   K 4.4 10/13/2018 1002   CL 103 10/13/2018 1002   CO2 22 10/13/2018 1002   GLUCOSE 96 10/13/2018 1002   BUN 9 10/13/2018 1002   CREATININE 1.07 (H) 10/13/2018 1002   CALCIUM 8.9 10/13/2018 1002   GFRNONAA 62 10/13/2018 1002   GFRAA 71 10/13/2018 1002   Lab Results  Component Value Date   HGBA1C 5.8 (H) 10/13/2018   HGBA1C 5.8 (H) 05/14/2018   Lab Results  Component Value Date   INSULIN 17.4 10/13/2018   INSULIN 18.3 05/14/2018   CBC    Component Value Date/Time   WBC 8.7 05/14/2018 1138   WBC 13.7 (H) 09/09/2007 0546   RBC 4.96 05/14/2018 1138   RBC 3.11 (L) 09/09/2007 0546   HGB 12.0 05/14/2018 1138   HCT 38.8 05/14/2018 1138   PLT 197 09/09/2007 0546   MCV 78 (L) 05/14/2018 1138   MCH 24.2 (L) 05/14/2018 1138   MCHC 30.9 (L) 05/14/2018 1138   MCHC 33.6 09/09/2007 0546   RDW 16.7 (H) 05/14/2018 1138   LYMPHSABS 1.8 05/14/2018 1138   EOSABS 0.1 05/14/2018 1138   BASOSABS 0.0 05/14/2018 1138   Iron/TIBC/Ferritin/ %Sat No results found for: IRON, TIBC, FERRITIN, IRONPCTSAT Lipid Panel     Component Value Date/Time   CHOL 171  05/14/2018 1138   TRIG 145 05/14/2018 1138   HDL 44 05/14/2018 1138   LDLCALC 98 05/14/2018 1138   Hepatic Function Panel     Component Value Date/Time   PROT 6.2 10/13/2018 1002   ALBUMIN 3.9 10/13/2018 1002   AST 12 10/13/2018 1002   ALT 9 10/13/2018 1002   ALKPHOS 69 10/13/2018 1002   BILITOT 0.2 10/13/2018 1002      Component Value Date/Time   TSH 2.830 05/14/2018 1138   Results for ADALYN, PENNOCK (MRN [id number]) as of 11/26/2018 07:49  Ref. Range 10/13/2018 10:02  Vitamin D, 25-Hydroxy Latest Ref Range: 30.0 - 100.0 ng/mL 49.5   ASSESSMENT AND PLAN: Vitamin D deficiency - Plan: Vitamin D, Ergocalciferol, (DRISDOL) 1.25  MG (50000 UT) CAPS capsule  Prediabetes - Plan: metFORMIN (GLUCOPHAGE) 500 MG tablet  Other depression - with emotional eating - Plan: buPROPion (WELLBUTRIN SR) 200 MG 12 hr tablet  At risk for diabetes mellitus  Class 2 severe obesity with serious comorbidity and body mass index (BMI) of 36.0 to 36.9 in adult, unspecified obesity type (HCC)  PLAN:  Vitamin D Deficiency Katelyn Walker was informed that low vitamin D levels contributes to fatigue and are associated with obesity, breast, and colon cancer. She agrees to continue to take prescription Vit D @50 ,000 IU every week #4 with no refills and will follow up for routine testing of vitamin D, at least 2-3 times per year. She was informed of the risk of over-replacement of vitamin D and agrees to not increase her dose unless she discusses this with [name] first. Katelyn Walker agrees to follow up as directed in 3 to 4 weeks.  Pre-Diabetes Katelyn Walker will continue to work on weight loss, exercise, and decreasing simple carbohydrates in her diet to help decrease the risk of diabetes. She was informed that eating too many simple carbohydrates or too many calories at one sitting increases the likelihood of GI side effects. Katelyn Walker agreed to continue taking metformin 500mg  with breakfast #30 with no refills and a prescription  was written today. Katelyn Walker agreed to follow up with [name] as directed to monitor her progress.  Diabetes risk counseling Katelyn Walker was given extended (15 minutes) diabetes prevention counseling today. She is 47 y.o. female and has risk factors for diabetes including pre-diabetes and obesity. We discussed intensive lifestyle modifications today with an emphasis on weight loss as well as increasing exercise and decreasing simple carbohydrates in her diet.  Depression with Emotional Eating Behaviors We discussed behavior modification techniques today to help Katelyn Walker deal with her emotional eating and depression. She has agreed to take Wellbutrin SR 200mg  BID #60 with no refills and agreed to follow up as directed.   Obesity Katelyn Walker is currently in the action stage of change. As such, her goal is to continue with weight loss efforts. She has agreed to keep a food journal with 1200 to 1400 calories and 80 grams of protein.  Katelyn Walker has been instructed to work up to a goal of 150 minutes of combined cardio and strengthening exercise per week for weight loss and overall health benefits. We discussed the following Behavioral Modification Strategies today: increasing lean protein intake, decreasing simple carbohydrates, work on meal planning and easy cooking plans, dealing with family or coworker sabotage, holiday eating strategies, and celebration eating. strategies  Katelyn Walker has agreed to follow up with our clinic in 3 to 4 weeks. She was informed of the importance of frequent follow up visits to maximize her success with intensive lifestyle modifications for her multiple health conditions.   OBESITY BEHAVIORAL INTERVENTION VISIT  Today's visit was # 10   Starting weight: 242 lbs Starting date: 05/14/18 Today's weight : Weight: 234 lb (106.1 kg)  Today's date: 11/25/2018 Total lbs lost to date: 8  ASK: We discussed the diagnosis of obesity with Katelyn Walker today and Katelyn Walker agreed to give [name]  permission to discuss obesity behavioral modification therapy today.  ASSESS: Katelyn Walker has the diagnosis of obesity and her BMI today is 36.6. Katelyn Walker is in the action stage of change   ADVISE: Katelyn Walker was educated on the multiple health risks of obesity as well as the benefit of weight loss to improve her health. She was advised of the need for long term  treatment and the importance of lifestyle modifications to improve her current health and to decrease her risk of future health problems.  AGREE: Multiple dietary modification options and treatment options were discussed and Katelyn Walker agreed to follow the recommendations documented in the above note.  ARRANGE: Katelyn Walker was educated on the importance of frequent visits to treat obesity as outlined per CMS and USPSTF guidelines and agreed to schedule her next follow up appointment today.  I, Kirke Corin, am acting as transcriptionist for Wilder Glade, MD I have reviewed the above documentation for accuracy and completeness, and I agree with the above. -Quillian Quince, MD

## 2018-12-11 ENCOUNTER — Telehealth: Payer: BLUE CROSS/BLUE SHIELD | Admitting: Physician Assistant

## 2018-12-11 DIAGNOSIS — B9789 Other viral agents as the cause of diseases classified elsewhere: Secondary | ICD-10-CM

## 2018-12-11 DIAGNOSIS — J019 Acute sinusitis, unspecified: Secondary | ICD-10-CM

## 2018-12-11 MED ORDER — IPRATROPIUM BROMIDE 0.03 % NA SOLN: 2.0000 | Freq: Two times a day (BID) | NASAL | 0 refills | Status: DC

## 2018-12-11 NOTE — Progress Notes (Signed)

## 2018-12-13 DIAGNOSIS — J019 Acute sinusitis, unspecified: Secondary | ICD-10-CM | POA: Diagnosis not present

## 2018-12-17 DIAGNOSIS — E669 Obesity, unspecified: Secondary | ICD-10-CM

## 2018-12-17 HISTORY — DX: Obesity, unspecified: E66.9

## 2018-12-22 NOTE — Progress Notes (Signed)
Office: [phone]  /  Fax: [phone]    Date: December 24, 2018   Time Seen: 3:50pm Duration: 30 minutes Provider: Lawerance CruelGaytri Cobi Walker, Psy.D. Type of Session: Individual Therapy  Type of Contact: Face-to-face  HPI: Katelyn Katelyn Walker referred by Katelyn Katelyn Walker to depression with emotional eating behaviors.She was seen for an initial appointment with this provider on October 01, 2018.Per the note for thelastvisit withDr. Lamar Benesaren Beasleyon October7, 2019,"[name] struggling with emotional eating and using food for comfort to the extent that it is negatively impacting herhealth. Sheoften snacks when sheis not hungry. Susannesometimes feels sheis out of control and then feels guilty that shemade poor food choices. Katelyn NielsenShehas been working on behavior modification techniques to help reduce heremotional eating and has been somewhat successful.She has an appointment with psychologist next week.Sheshows no sign of suicidal or homicidal ideations. She denies dry mouth."In addition, per the note for the initial visit withDr. Darrel Hooveraren Beasleyon May 14, 2018,Susanneshared she has been heavy most of her life and started gaining weight at age 48. Her heaviest weight ever was 255 pounds. During her initial appointment Dr. Dalbert GarnetBeasley, Susannereported experiencing the following: significant food cravings issues; frequently making poor food choices; frequently eating larger portions than normal; and struggling with emotional eating.Duringthe initial appointment with this provider, Katelyn Katelyn Walker reported that she informed the dietician that logging food is "stressful" and therefore, was reportedly recommended to see this provider. She indicated the last episode of emotional eating was "earlier this week." Katelyn Katelyn Walker indicated that she went and "got the breakfast" she wanted to eat. Regarding her current prescribed meal plan, Katelyn Katelyn Walker, "I am really Katelyn Walker by it."Katelyn Katelyn Walker reported emotional eating started in  childhood. She denied a history of binge eating. Katelyn Katelyn Walker denied a history of purging and engagement in any other compensatory strategies. She denied ever being diagnosed with an eating disorder. Moreover, Katelyn Katelyn Walker indicated that her focus on weight and food likely started when her mother started focusing on it during childhood. She indicated she was rewarded for reaching certain weight goals.Furthermore,Katelyn Katelyn Walker to complete a questionnaire assessing various behaviors related to emotional eating. Susanneendorsed the following: eat certain foods when you are anxious, stressed, depressed, or your feelings are hurt, find food is comforting to you and eat as a reward. During today's appointment, Katelyn Katelyn Walker reported she Katelyn Walker her weight since the last appointment.  Session Content: Session focused on the following treatment goal: decrease emotional eating. The session was initiated with the administration of the PHQ-9 and GAD-7, as well as a brief check-in. Katelyn Katelyn Walker reported she Katelyn Walker her weight despite holiday eating. She explained she ate sweets during the holidays, but made better choices with other meals. She Katelyn Walker one instance of emotional eating since the last appointment secondary to a stressful phone call. Regarding pleasurable activities, Katelyn Katelyn Walker she purchased a new book to read. She also Katelyn Walker ongoing stressors at work; therefore, this provider engaged Katelyn Katelyn Walker to continuing engaging in pleasurable activities. Moreover, Katelyn Katelyn Walker greater awareness of hunger and fullness cues when eating. Thus, psychoeducation regarding mindfulness was provided. A handout was provided to Katelyn Katelyn Walker with further information regarding mindfulness, including exercises. This provider also explained the benefit of mindfulness as it relates to emotional eating. Katelyn Katelyn Walker to engage in the provided exercises between now and the next appointment with this provider. Katelyn Katelyn Walker. She was Katelyn Walker  through a mindfulness exercise. This provider Katelyn Walker termination planning, including the option for a referral for longer-term therapeutic services. Katelyn Katelyn Walker to today's session as evidenced by openness to sharing, responsiveness  to feedback, and engagement in a mindfulness exercise.  Mental Status Examination: Katelyn Katelyn Walker arrived early for the appointment. She presented as appropriately dressed and groomed. Katelyn Katelyn Walker appeared her stated age and demonstrated adequate orientation to time, place, person, and purpose of the appointment. She also demonstrated appropriate eye contact. No psychomotor abnormalities or behavioral peculiarities Katelyn Walker. Her mood was euthymic with congruent affect. Her thought processes were logical, linear, and goal-directed. No hallucinations, delusions, bizarre thinking or behavior reported or observed. Judgment, insight, and impulse control appeared to be grossly intact. There was no evidence of paraphasias (i.e., errors in speech, gross mispronunciations, and word substitutions), repetition deficits, or disturbances in volume or prosody (i.e., rhythm and intonation). There was no evidence of attention or memory impairments. Katelyn Katelyn Walker denied current suicidal and homicidal ideation, intent or plan.  Structured Assessment Results: The Patient Health Questionnaire-9 (PHQ-9) is a self-report measure that assesses symptoms and severity of depression over the course of the last two weeks. Katelyn Walker obtained a score of 2 suggesting minimal depression. Katelyn Walker finds the endorsed symptoms to be not difficult at all. Depression screen Katelyn Katelyn Walker 2/9 12/24/2018  Decreased Interest 0  Down, Depressed, Hopeless 0  PHQ - 2 Score 0  Altered sleeping 1  Tired, decreased energy 1  Change in appetite 0  Feeling bad or failure about yourself  0  Trouble concentrating 0  Moving slowly or fidgety/restless 0  Suicidal thoughts 0  PHQ-9 Score 2  Difficult doing work/chores -   The Generalized  Anxiety Disorder-7 (GAD-7) is a brief self-report measure that assesses symptoms of anxiety over the course of the last two weeks. Jazara obtained a score of 2 suggesting minimal anxiety. GAD 7 : Generalized Anxiety Score 12/24/2018  Nervous, Anxious, on Edge 1  Control/stop worrying 0  Worry too much - different things 0  Trouble relaxing 0  Restless 1  Easily annoyed or irritable 0  Afraid - awful might happen 0  Total GAD 7 Score 2  Anxiety Difficulty Not difficult at all   Interventions:  Administration of PHQ-9 and GAD-7 for symptom monitoring Review of content from the previous session Empathic reflections and validation Psychoeducation regarding mindfulness Mindfulness exercise Termination planning Positive reinforcement Brief chart review  DSM-5 Diagnosis: 311 (F32.8) Other Specified Depressive Disorder, Emotional Eating Behaviors  Treatment Goal & Progress: During the initial appointment with this provider, the following treatment goal was established: decrease emotional eating. Adelyne has demonstrated progress in her goal as evidenced by awareness of hunger patterns and triggers for emotional eating. She also reported engagement in learned skills. During today's appointment, Elvie reported only one instance of emotional eating since the last appointment with this provider.   Plan: Netasha continues to appear able and willing to participate as evidenced by engagement in reciprocal conversation, and asking questions for clarification as appropriate. Based on the discussion regarding termination planning, the next appointment will be scheduled in three weeks. The next session will focus further on mindfulness.

## 2018-12-24 ENCOUNTER — Ambulatory Visit (INDEPENDENT_AMBULATORY_CARE_PROVIDER_SITE_OTHER): Payer: BLUE CROSS/BLUE SHIELD | Admitting: Family Medicine

## 2018-12-24 ENCOUNTER — Encounter (INDEPENDENT_AMBULATORY_CARE_PROVIDER_SITE_OTHER): Payer: Self-pay | Admitting: Family Medicine

## 2018-12-24 ENCOUNTER — Ambulatory Visit (INDEPENDENT_AMBULATORY_CARE_PROVIDER_SITE_OTHER): Payer: BLUE CROSS/BLUE SHIELD | Admitting: Psychology

## 2018-12-24 VITALS — BP 120/76 | HR 71 | Temp 98.1°F | Ht 67.0 in | Wt 234.0 lb

## 2018-12-24 DIAGNOSIS — F3289 Other specified depressive episodes: Secondary | ICD-10-CM

## 2018-12-24 DIAGNOSIS — E559 Vitamin D deficiency, unspecified: Secondary | ICD-10-CM

## 2018-12-24 DIAGNOSIS — R7303 Prediabetes: Secondary | ICD-10-CM | POA: Diagnosis not present

## 2018-12-24 DIAGNOSIS — Z9189 Other specified personal risk factors, not elsewhere classified: Secondary | ICD-10-CM | POA: Diagnosis not present

## 2018-12-24 DIAGNOSIS — Z6836 Body mass index (BMI) 36.0-36.9, adult: Secondary | ICD-10-CM

## 2018-12-24 MED ORDER — BUPROPION HCL ER (SR) 200 MG PO TB12: 200.0000 mg | ORAL_TABLET | Freq: Two times a day (BID) | ORAL | 0 refills | Status: DC

## 2018-12-24 MED ORDER — VITAMIN D (ERGOCALCIFEROL) 1.25 MG (50000 UNIT) PO CAPS: 50000.0000 [IU] | ORAL_CAPSULE | ORAL | 0 refills | Status: DC

## 2018-12-24 MED ORDER — METFORMIN HCL 500 MG PO TABS: 500.0000 mg | ORAL_TABLET | Freq: Every day | ORAL | 0 refills | Status: DC

## 2018-12-25 NOTE — Progress Notes (Signed)
Office: [phone]  /  Fax: [phone]   HPI:   Chief Complaint: OBESITY Katelyn Walker is here to discuss her progress with her obesity treatment plan. She is on the Category 2 plan with modified breakfast and is following her eating plan approximately 50 % of the time. She states she is walking a mile 30 minutes 1 time per week. Katelyn Walker has done well maintaining weight over the holidays. She is ready to get back to a structured eating plan.  Her weight is 234 lb (106.1 kg) today and has not lost weight since her last visit. She has lost 8 lbs since starting treatment with [name].  Vitamin D deficiency Katelyn Walker has a diagnosis of vitamin D deficiency. She is currently stable on vit D and denies nausea, vomiting, or muscle weakness.  Pre-Diabetes Katelyn Walker has a diagnosis of pre-diabetes based on her elevated Hgb A1c and was informed this puts her at greater risk of developing diabetes. She is stable on metformin currently and ready to get back on track to her diet and exercise to decrease risk of diabetes. She denies nausea, vomiting, or hypoglycemia.  At risk for diabetes Katelyn Walker is at higher than average risk for developing diabetes due to her obesity. She currently denies polyuria or polydipsia.  Depression with emotional eating behaviors Katelyn Walker is struggling with emotional eating and using food for comfort to the extent that it is negatively impacting her health. She often snacks when she is not hungry. Katelyn Walker sometimes feels she is out of control and then feels guilty that she made poor food choices. She has been working on behavior modification techniques to help reduce her emotional eating and has been somewhat successful.  ASSESSMENT AND PLAN:  Vitamin D deficiency - Plan: Vitamin D, Ergocalciferol, (DRISDOL) 1.25 MG (50000 UT) CAPS capsule  Prediabetes - Plan: metFORMIN (GLUCOPHAGE) 500 MG tablet  Other depression - with emotional eating - Plan: buPROPion (WELLBUTRIN SR) 200 MG 12  hr tablet  At risk for diabetes mellitus  Class 2 severe obesity with serious comorbidity and body mass index (BMI) of 36.0 to 36.9 in adult, unspecified obesity type (HCC)  PLAN:  Vitamin D Deficiency Katelyn Walker was informed that low vitamin D levels contributes to fatigue and are associated with obesity, breast, and colon cancer. She agrees to continue to take prescription Vit D @50 ,000 IU every week #4 with no refills and will follow up for routine testing of vitamin D, at least 2-3 times per year. She was informed of the risk of over-replacement of vitamin D and agrees to not increase her dose unless she discusses this with [name] first. Katelyn Walker agrees to follow up in 2 weeks.  Pre-Diabetes Katelyn Walker will continue to work on weight loss, exercise, and decreasing simple carbohydrates in her diet to help decrease the risk of diabetes.She was informed that eating too many simple carbohydrates or too many calories at one sitting increases the likelihood of GI side effects. Katelyn Walker agreed to continue taking metformin 500mg  qd #30 with no refills and a prescription was written today. Katelyn Walker agreed to follow up with [name] as directed to monitor her progress.  Diabetes risk counseling Katelyn Walker was given extended (15 minutes) diabetes prevention counseling today. She is 48 y.o. female and has risk factors for diabetes including pre-diabetes and obesity. We discussed intensive lifestyle modifications today with an emphasis on weight loss as well as increasing exercise and decreasing simple carbohydrates in her diet.  Depression with Emotional Eating Behaviors We discussed behavior modification techniques today  to help Katelyn Walker deal with her emotional eating and depression. She has agreed to take Wellbutrin SR 200mg  BID #60 with no refills and agreed to follow up as directed.  Obesity Katelyn Walker is currently in the action stage of change. As such, her goal is to continue with weight loss efforts. She has agreed to  follow the Category 2 plan. Katelyn Walker has been instructed to work up to a goal of 150 minutes of combined cardio and strengthening exercise per week for weight loss and overall health benefits. We discussed the following Behavioral Modification Strategies today: increasing lean protein intake, decreasing simple carbohydrates, and work on meal planning and easy cooking plans.  Katelyn Walker has agreed to follow up with our clinic in 2 weeks. She was informed of the importance of frequent follow up visits to maximize her success with intensive lifestyle modifications for her multiple health conditions.  ALLERGIES: Allergies  Allergen Reactions  . Contrave [Naltrexone-Bupropion Hcl Er] Other (See Comments)    Vertigo/ dizziness    MEDICATIONS: Current Outpatient Medications on File Prior to Visit  Medication Sig Dispense Refill  . ipratropium (ATROVENT) 0.03 % nasal spray Place 2 sprays into both nostrils every 12 (twelve) hours. 30 mL 0  . levothyroxine (SYNTHROID, LEVOTHROID) 75 MCG tablet Take 75 mcg by mouth daily before breakfast.    . Norethindrone Acetate-Ethinyl Estrad-FE (BLISOVI 24 FE) 1-20 MG-MCG(24) tablet Take 1 tablet by mouth daily.    Marland Kitchen. omeprazole (PRILOSEC) 40 MG capsule Take 40 mg by mouth daily.    . valsartan (DIOVAN) 80 MG tablet Take 80 mg by mouth daily.    Marland Kitchen. zolpidem (AMBIEN) 10 MG tablet Take 10 mg by mouth at bedtime as needed for sleep.     No current facility-administered medications on file prior to visit.     PAST MEDICAL HISTORY: Past Medical History:  Diagnosis Date  . Back pain   . Dyspnea   . Elevated blood pressure reading without diagnosis of hypertension   . Esophageal ulcer   . GERD (gastroesophageal reflux disease)   . Hot flashes   . Hypothyroid   . Joint pain   . Sciatica, left side   . Vitamin B 12 deficiency   . Vitamin D deficiency     PAST SURGICAL HISTORY: Past Surgical History:  Procedure Laterality Date  . DILATION AND CURETTAGE OF  UTERUS     2011   . OVARIAN CYST SURGERY     2009, 2013    SOCIAL HISTORY: Social History   Tobacco Use  . Smoking status: Never Smoker  . Smokeless tobacco: Never Used  Substance Use Topics  . Alcohol use: Not on file  . Drug use: Not on file    FAMILY HISTORY: Family History  Problem Relation Age of Onset  . Alcoholism Mother   . Eating disorder Mother   . Diabetes Father   . Hypertension Father   . Sleep apnea Father     ROS: Review of Systems  Constitutional: Negative for weight loss.  Gastrointestinal: Negative for nausea and vomiting.  Genitourinary:       Negative for polyuria.  Musculoskeletal:       Negative for muscle weakness.  Endo/Heme/Allergies: Negative for polydipsia.       Negative for hypoglycemia.  Psychiatric/Behavioral: Positive for depression.    PHYSICAL EXAM: Blood pressure 120/76, pulse 71, temperature 98.1 F (36.7 C), temperature source Oral, height 5\' 7"  (1.702 m), weight 234 lb (106.1 kg), SpO2 98 %. Body mass index  is 36.65 kg/m. Physical Exam Vitals signs reviewed.  Constitutional:      Appearance: Normal appearance. She is obese.  Cardiovascular:     Rate and Rhythm: Normal rate.  Pulmonary:     Effort: Pulmonary effort is normal.  Musculoskeletal: Normal range of motion.  Skin:    General: Skin is warm and dry.  Neurological:     Mental Status: She is alert and oriented to person, place, and time.  Psychiatric:        Mood and Affect: Mood normal.        Behavior: Behavior normal.     RECENT LABS AND TESTS: BMET    Component Value Date/Time   NA 140 10/13/2018 1002   K 4.4 10/13/2018 1002   CL 103 10/13/2018 1002   CO2 22 10/13/2018 1002   GLUCOSE 96 10/13/2018 1002   BUN 9 10/13/2018 1002   CREATININE 1.07 (H) 10/13/2018 1002   CALCIUM 8.9 10/13/2018 1002   GFRNONAA 62 10/13/2018 1002   GFRAA 71 10/13/2018 1002   Lab Results  Component Value Date   HGBA1C 5.8 (H) 10/13/2018   HGBA1C 5.8 (H) 05/14/2018     Lab Results  Component Value Date   INSULIN 17.4 10/13/2018   INSULIN 18.3 05/14/2018   CBC    Component Value Date/Time   WBC 8.7 05/14/2018 1138   WBC 13.7 (H) 09/09/2007 0546   RBC 4.96 05/14/2018 1138   RBC 3.11 (L) 09/09/2007 0546   HGB 12.0 05/14/2018 1138   HCT 38.8 05/14/2018 1138   PLT 197 09/09/2007 0546   MCV 78 (L) 05/14/2018 1138   MCH 24.2 (L) 05/14/2018 1138   MCHC 30.9 (L) 05/14/2018 1138   MCHC 33.6 09/09/2007 0546   RDW 16.7 (H) 05/14/2018 1138   LYMPHSABS 1.8 05/14/2018 1138   EOSABS 0.1 05/14/2018 1138   BASOSABS 0.0 05/14/2018 1138   Iron/TIBC/Ferritin/ %Sat No results found for: IRON, TIBC, FERRITIN, IRONPCTSAT Lipid Panel     Component Value Date/Time   CHOL 171 05/14/2018 1138   TRIG 145 05/14/2018 1138   HDL 44 05/14/2018 1138   LDLCALC 98 05/14/2018 1138   Hepatic Function Panel     Component Value Date/Time   PROT 6.2 10/13/2018 1002   ALBUMIN 3.9 10/13/2018 1002   AST 12 10/13/2018 1002   ALT 9 10/13/2018 1002   ALKPHOS 69 10/13/2018 1002   BILITOT 0.2 10/13/2018 1002      Component Value Date/Time   TSH 2.830 05/14/2018 1138   Results for Katelyn Walker, Katelyn Walker (MRN [id number]) as of 12/24/2018 13:32  Ref. Range 10/13/2018 10:02  Vitamin D, 25-Hydroxy Latest Ref Range: 30.0 - 100.0 ng/mL 49.5    OBESITY BEHAVIORAL INTERVENTION VISIT  Today's visit was # 11   Starting weight: 242 lbs Starting date: 05/14/18 Today's weight : Weight: 234 lb (106.1 kg)  Today's date: 12/24/2018 Total lbs lost to date: 8  ASK: We discussed the diagnosis of obesity with Katelyn Walker today and Katelyn Walker agreed to give us permission to discuss obesity behavioral modification therapy today.  ASSESS: Katelyn Walker has the diagnosis of obesity and her BMI today is 36.6. Katelyn Walker is in the action stage of change.   ADVISE: Katelyn Walker was educated on the multiple health risks of obesity as well as the benefit of weight loss to improve her health. She was  advised of the need for long term treatment and the importance of lifestyle modifications to improve her current health and to decrease her  risk of future health problems.  AGREE: Multiple dietary modification options and treatment options were discussed and Kaly agreed to follow the recommendations documented in the above note.  ARRANGE: Katelyn Walker was educated on the importance of frequent visits to treat obesity as outlined per CMS and USPSTF guidelines and agreed to schedule her next follow up appointment today.  I, Kirke Corin, am acting as transcriptionist for Wilder Glade, MD I have reviewed the above documentation for accuracy and completeness, and I agree with the above. -Quillian Quince, MD

## 2019-01-06 NOTE — Progress Notes (Signed)
Office: [phone]  /  Fax: [phone]    Date: January 14, 2019   Time Seen: 3:50pm Duration: 33 minutes Provider: Lawerance CruelGaytri Barker, Psy.D. Type of Session: Individual Therapy  Type of Contact: Face-to-face  Session Content: Katelyn Walker is a 48 y.o. female presenting for a follow-up appointment to address the previously established treatment goal of decreasing emotional eating. The session was initiated with the administration of the PHQ-9 and GAD-7, as well as a brief check-in. Katelyn Walker reported work stressors are decreasing. Regarding food, Katelyn Walker reported, "I've been trying to work on being present in the moment." She described experiencing various sensations when eating different foods. During today's appointment with Dr. Dalbert GarnetBeasley, she shared she was informed she gained three pounds, which was reportedly water weight. She explained recently experiencing issues with consuming her breakfast and explained she has engaged in emotional eating in the mornings due to stress. In addition, she discussed difficulty with protein intake. Thus, she was engaged in problem solving. Katelyn Walker expressed a plan to take certain foods (e.g., yogurt, cheese sticks, bread, and peanut butter) to work at the beginning of the work week to ensure she has options available to her. Moreover, this provider gave her handouts for seasonings to utilize for protein. This provider and Katelyn Walker further discussed meal prepping as well, as Katelyn Walker acknowledged stress and fatigue tend to contribute to emotional eating. Remainder of session focused further on mindfulness. Psychoeducation regarding the hunger and satisfaction scale was provided, and a handout was given to Katelyn Walker to be incorporated into her mindfulness practice. This provider also discussed the utilization of YouTube for mindfulness exercises and gave Katelyn Walker handouts for additional mindfulness exercises. Katelyn Walker was receptive to today's session as evidenced by openness to  sharing, responsiveness to feedback, and willingness to implement discussed strategies and continue practicing mindfulness.   Mental Status Examination: Katelyn Walker arrived early for the appointment. She presented as appropriately dressed and groomed. Katelyn Walker appeared her stated age and demonstrated adequate orientation to time, place, person, and purpose of the appointment. She also demonstrated appropriate eye contact. No psychomotor abnormalities or behavioral peculiarities noted. Her mood was euthymic with congruent affect. Her thought processes were logical, linear, and goal-directed. No hallucinations, delusions, bizarre thinking or behavior reported or observed. Judgment, insight, and impulse control appeared to be grossly intact. There was no evidence of paraphasias (i.e., errors in speech, gross mispronunciations, and word substitutions), repetition deficits, or disturbances in volume or prosody (i.e., rhythm and intonation). There was no evidence of attention or memory impairments. Katelyn Walker denied current suicidal and homicidal ideation, plan and intent.   Structured Assessment Results: The Patient Health Questionnaire-9 (PHQ-9) is a self-report measure that assesses symptoms and severity of depression over the course of the last two weeks. Katelyn Walker obtained a score of 3 suggesting minimal depression. Katelyn Walker finds the endorsed symptoms to be not difficult at all. Depression screen [facility] 2/9 01/14/2019  Decreased Interest 0  Down, Depressed, Hopeless 0  PHQ - 2 Score 0  Altered sleeping 2  Tired, decreased energy 0  Change in appetite 0  Feeling bad or failure about yourself  0  Trouble concentrating 0  Moving slowly or fidgety/restless 1  Suicidal thoughts 0  PHQ-9 Score 3  Difficult doing work/chores -   The Generalized Anxiety Disorder-7 (GAD-7) is a brief self-report measure that assesses symptoms of anxiety over the course of the last two weeks. Katelyn Walker obtained a score of 2 suggesting  minimal anxiety. GAD 7 : Generalized Anxiety Score 01/14/2019  Nervous, Anxious, on  Edge 1  Control/stop worrying 0  Worry too much - different things 0  Trouble relaxing 0  Restless 1  Easily annoyed or irritable 0  Afraid - awful might happen 0  Total GAD 7 Score 2  Anxiety Difficulty Not difficult at all   Interventions:  Administration of PHQ-9 and GAD-7 for symptom monitoring Review of content from the previous session Empathic reflections and validation Problem solving Positive reinforcement Psychoeducation regarding the hunger and satisfaction scale Brief chart review  DSM-5 Diagnosis: 311 (F32.8) Other Specified Depressive Disorder, Emotional Eating Behaviors  Treatment Goal & Progress: During the initial appointment with this provider, the following treatment goal was established: decrease emotional eating. Katelyn Walker has demonstrated progress in her goal as evidenced by increased awareness of hunger patterns and triggers for emotional eating. Per self-report, Katelyn Walker is engaging in learned skills.   Plan: Katelyn Walker continues to appear able and willing to participate as evidenced by engagement in reciprocal conversation, and asking questions for clarification as appropriate. The next appointment will be scheduled in two weeks. The next session will focus further on mindfulness, and termination.

## 2019-01-14 ENCOUNTER — Ambulatory Visit (INDEPENDENT_AMBULATORY_CARE_PROVIDER_SITE_OTHER): Payer: BLUE CROSS/BLUE SHIELD | Admitting: Psychology

## 2019-01-14 ENCOUNTER — Ambulatory Visit (INDEPENDENT_AMBULATORY_CARE_PROVIDER_SITE_OTHER): Payer: BLUE CROSS/BLUE SHIELD | Admitting: Family Medicine

## 2019-01-14 ENCOUNTER — Encounter (INDEPENDENT_AMBULATORY_CARE_PROVIDER_SITE_OTHER): Payer: Self-pay | Admitting: Family Medicine

## 2019-01-14 VITALS — BP 134/84 | HR 85 | Temp 98.2°F | Ht 67.0 in | Wt 237.0 lb

## 2019-01-14 DIAGNOSIS — F3289 Other specified depressive episodes: Secondary | ICD-10-CM | POA: Diagnosis not present

## 2019-01-14 DIAGNOSIS — R7303 Prediabetes: Secondary | ICD-10-CM | POA: Diagnosis not present

## 2019-01-14 DIAGNOSIS — E559 Vitamin D deficiency, unspecified: Secondary | ICD-10-CM | POA: Diagnosis not present

## 2019-01-14 DIAGNOSIS — Z9189 Other specified personal risk factors, not elsewhere classified: Secondary | ICD-10-CM | POA: Diagnosis not present

## 2019-01-14 DIAGNOSIS — Z6837 Body mass index (BMI) 37.0-37.9, adult: Secondary | ICD-10-CM

## 2019-01-14 MED ORDER — VITAMIN D (ERGOCALCIFEROL) 1.25 MG (50000 UNIT) PO CAPS: 50000.0000 [IU] | ORAL_CAPSULE | ORAL | 0 refills | Status: DC

## 2019-01-14 MED ORDER — BUPROPION HCL ER (SR) 200 MG PO TB12: 200.0000 mg | ORAL_TABLET | Freq: Two times a day (BID) | ORAL | 0 refills | Status: DC

## 2019-01-15 MED ORDER — METFORMIN HCL 500 MG PO TABS: 500.0000 mg | ORAL_TABLET | Freq: Every day | ORAL | 0 refills | Status: DC

## 2019-01-15 NOTE — Progress Notes (Signed)
Office: [phone]  /  Fax: [phone]   HPI:   Chief Complaint: OBESITY Katelyn Walker is here to discuss her progress with her obesity treatment plan. She is on the Category 2 plan with modified breakfast and is following her eating plan approximately 75 % of the time. She states she is walking 30 minutes 1 time per week. Katelyn Walker is retaining fluid today. She has done well with meal planning and recognizing emotional eating.  Her weight is 237 lb (107.5 kg) today and has had a weight gain of 3 pounds over a period of 4 weeks since her last visit. She has lost 5 lbs since starting treatment with [name].  Depression with emotional eating behaviors Katelyn Walker's mood is stable and she is doing well with her increased dose. She feels therapy with Dr. Dewaine Conger is helping. She is struggling with emotional eating and using food for comfort to the extent that it is negatively impacting her health. She often snacks when she is not hungry. Katelyn Walker sometimes feels she is out of control and then feels guilty that she made poor food choices. She has been working on behavior modification techniques to help reduce her emotional eating and has been somewhat successful.   Pre-Diabetes Katelyn Walker has a diagnosis of pre-diabetes based on her elevated Hgb A1c and was informed this puts her at greater risk of developing diabetes. She is stable on metformin and is doing well with her diet prescription. She continues to work on diet and exercise to decrease risk of diabetes. She denies nausea, vomiting, or hypoglycemia.  At risk for diabetes Katelyn Walker is at higher than average risk for developing diabetes due to her pre-diabetes and obesity. She currently denies polyuria or polydipsia.  Vitamin D deficiency Katelyn Walker has a diagnosis of vitamin D deficiency. She is currently stable on vit D and denies nausea, vomiting, or muscle weakness.  ASSESSMENT AND PLAN:  Vitamin D deficiency - Plan: Vitamin D, Ergocalciferol, (DRISDOL)  1.25 MG (50000 UT) CAPS capsule  Other depression - with emotional eating - Plan: buPROPion (WELLBUTRIN SR) 200 MG 12 hr tablet  At risk for diabetes mellitus  Class 2 severe obesity with serious comorbidity and body mass index (BMI) of 37.0 to 37.9 in adult, unspecified obesity type Katelyn Walker)  PLAN:  Pre-Diabetes Katelyn Walker will continue to work on weight loss, exercise, and decreasing simple carbohydrates in her diet to help decrease the risk of diabetes. She was informed that eating too many simple carbohydrates or too many calories at one sitting increases the likelihood of GI side effects. Katelyn Walker agreed to continue metformin 500mg  with breakfast qd #30 with no refills and a prescription was written today. Katelyn Walker agreed to follow up with [name] as directed to monitor her progress in 3 weeks.  Diabetes risk counseling Katelyn Walker was given extended (15 minutes) diabetes prevention counseling today. She is 48 y.o. female and has risk factors for diabetes including pre-diabetes and obesity. We discussed intensive lifestyle modifications today with an emphasis on weight loss as well as increasing exercise and decreasing simple carbohydrates in her diet.  Vitamin D Deficiency Katelyn Walker was informed that low vitamin D levels contributes to fatigue and are associated with obesity, breast, and colon cancer. She agrees to continue to take prescription Vit D @50 ,000 IU every week #4 with no refills and will follow up for routine testing of vitamin D, at least 2-3 times per year. She was informed of the risk of over-replacement of vitamin D and agrees to not increase her dose  unless she discusses this with us first. Katelyn Walker agreed to follow up as directed.  Depression with Emotional Eating Behaviors We discussed behavior modification techniques today to help Katelyn Walker deal with her emotional eating and depression. She has agreed to take Wellbutrin SR 200mg  BID #60 with no refills and agreed to follow up as  directed.  Obesity Katelyn Walker is currently in the action stage of change. As such, her goal is to continue with weight loss efforts. She has agreed to follow the Category 2 plan. Katelyn Walker has been instructed to work up to a goal of 150 minutes of combined cardio and strengthening exercise per week for weight loss and overall health benefits. We discussed the following Behavioral Modification Strategies today: increasing lean protein intake, decreasing simple carbohydrates, work on meal planning and easy cooking plans, dealing with family or coworker sabotage, and emotional eating strategies.  Katelyn Walker has agreed to follow up with our clinic in 3 weeks. She was informed of the importance of frequent follow up visits to maximize her success with intensive lifestyle modifications for her multiple health conditions.  ALLERGIES: Allergies  Allergen Reactions  . Contrave [Naltrexone-Bupropion Hcl Er] Other (See Comments)    Vertigo/ dizziness    MEDICATIONS: Current Outpatient Medications on File Prior to Visit  Medication Sig Dispense Refill  . ipratropium (ATROVENT) 0.03 % nasal spray Place 2 sprays into both nostrils every 12 (twelve) hours. 30 mL 0  . levothyroxine (SYNTHROID, LEVOTHROID) 75 MCG tablet Take 75 mcg by mouth daily before breakfast.    . metFORMIN (GLUCOPHAGE) 500 MG tablet Take 1 tablet (500 mg total) by mouth daily with breakfast. 30 tablet 0  . Norethindrone Acetate-Ethinyl Estrad-FE (BLISOVI 24 FE) 1-20 MG-MCG(24) tablet Take 1 tablet by mouth daily.    Marland Kitchen. omeprazole (PRILOSEC) 40 MG capsule Take 40 mg by mouth daily.    . valsartan (DIOVAN) 80 MG tablet Take 80 mg by mouth daily.    Marland Kitchen. zolpidem (AMBIEN) 10 MG tablet Take 10 mg by mouth at bedtime as needed for sleep.     No current facility-administered medications on file prior to visit.     PAST MEDICAL HISTORY: Past Medical History:  Diagnosis Date  . Back pain   . Dyspnea   . Elevated blood pressure reading without  diagnosis of hypertension   . Esophageal ulcer   . GERD (gastroesophageal reflux disease)   . Hot flashes   . Hypothyroid   . Joint pain   . Sciatica, left side   . Vitamin B 12 deficiency   . Vitamin D deficiency     PAST SURGICAL HISTORY: Past Surgical History:  Procedure Laterality Date  . DILATION AND CURETTAGE OF UTERUS     2011   . OVARIAN CYST SURGERY     2009, 2013    SOCIAL HISTORY: Social History   Tobacco Use  . Smoking status: Never Smoker  . Smokeless tobacco: Never Used  Substance Use Topics  . Alcohol use: Not on file  . Drug use: Not on file    FAMILY HISTORY: Family History  Problem Relation Age of Onset  . Alcoholism Mother   . Eating disorder Mother   . Diabetes Father   . Hypertension Father   . Sleep apnea Father     ROS: Review of Systems  Constitutional: Negative for weight loss.  Gastrointestinal: Negative for nausea and vomiting.  Genitourinary:       Negative for polyuria.  Musculoskeletal:       Negative  for muscle weakness.  Endo/Heme/Allergies: Negative for polydipsia.       Negative for hypoglycemia.  Psychiatric/Behavioral: Positive for depression.    PHYSICAL EXAM: Blood pressure 134/84, pulse 85, temperature 98.2 F (36.8 C), temperature source Oral, height 5\' 7"  (1.702 m), weight 237 lb (107.5 kg), SpO2 99 %. Body mass index is 37.12 kg/m. Physical Exam Vitals signs reviewed.  Constitutional:      Appearance: Normal appearance. She is obese.  Cardiovascular:     Rate and Rhythm: Normal rate.  Pulmonary:     Effort: Pulmonary effort is normal.  Musculoskeletal: Normal range of motion.  Skin:    General: Skin is warm and dry.  Neurological:     Mental Status: She is alert and oriented to person, place, and time.  Psychiatric:        Mood and Affect: Mood normal.        Behavior: Behavior normal.     RECENT LABS AND TESTS: BMET    Component Value Date/Time   NA 140 10/13/2018 1002   K 4.4 10/13/2018 1002    CL 103 10/13/2018 1002   CO2 22 10/13/2018 1002   GLUCOSE 96 10/13/2018 1002   BUN 9 10/13/2018 1002   CREATININE 1.07 (H) 10/13/2018 1002   CALCIUM 8.9 10/13/2018 1002   GFRNONAA 62 10/13/2018 1002   GFRAA 71 10/13/2018 1002   Lab Results  Component Value Date   HGBA1C 5.8 (H) 10/13/2018   HGBA1C 5.8 (H) 05/14/2018   Lab Results  Component Value Date   INSULIN 17.4 10/13/2018   INSULIN 18.3 05/14/2018   CBC    Component Value Date/Time   WBC 8.7 05/14/2018 1138   WBC 13.7 (H) 09/09/2007 0546   RBC 4.96 05/14/2018 1138   RBC 3.11 (L) 09/09/2007 0546   HGB 12.0 05/14/2018 1138   HCT 38.8 05/14/2018 1138   PLT 197 09/09/2007 0546   MCV 78 (L) 05/14/2018 1138   MCH 24.2 (L) 05/14/2018 1138   MCHC 30.9 (L) 05/14/2018 1138   MCHC 33.6 09/09/2007 0546   RDW 16.7 (H) 05/14/2018 1138   LYMPHSABS 1.8 05/14/2018 1138   EOSABS 0.1 05/14/2018 1138   BASOSABS 0.0 05/14/2018 1138   Iron/TIBC/Ferritin/ %Sat No results found for: IRON, TIBC, FERRITIN, IRONPCTSAT Lipid Panel     Component Value Date/Time   CHOL 171 05/14/2018 1138   TRIG 145 05/14/2018 1138   HDL 44 05/14/2018 1138   LDLCALC 98 05/14/2018 1138   Hepatic Function Panel     Component Value Date/Time   PROT 6.2 10/13/2018 1002   ALBUMIN 3.9 10/13/2018 1002   AST 12 10/13/2018 1002   ALT 9 10/13/2018 1002   ALKPHOS 69 10/13/2018 1002   BILITOT 0.2 10/13/2018 1002      Component Value Date/Time   TSH 2.830 05/14/2018 1138   Results for Gevena BarreCRIHFIELD, Samya G (MRN [id number]) as of 01/15/2019 11:54  Ref. Range 10/13/2018 10:02  Vitamin D, 25-Hydroxy Latest Ref Range: 30.0 - 100.0 ng/mL 49.5   OBESITY BEHAVIORAL INTERVENTION VISIT  Today's visit was # 12   Starting weight: 242 lbs Starting date: 05/14/18 Today's weight : Weight: 237 lb (107.5 kg)  Today's date: 01/14/2019 Total lbs lost to date: 5  ASK: We discussed the diagnosis of obesity with Gevena BarreSusanne G Harcum today and Katelyn Walker agreed to give us  permission to discuss obesity behavioral modification therapy today.  ASSESS: Katelyn Walker has the diagnosis of obesity and her BMI today is 37.1. Katelyn Walker is in the  action stage of change.  ADVISE: Alysea was educated on the multiple health risks of obesity as well as the benefit of weight loss to improve her health. She was advised of the need for long term treatment and the importance of lifestyle modifications to improve her current health and to decrease her risk of future health problems.  AGREE: Multiple dietary modification options and treatment options were discussed and Nyesha agreed to follow the recommendations documented in the above note.  ARRANGE: Marycarmen was educated on the importance of frequent visits to treat obesity as outlined per CMS and USPSTF guidelines and agreed to schedule her next follow up appointment today.  I, Kirke Corin, am acting as transcriptionist for Wilder Glade, MD  I have reviewed the above documentation for accuracy and completeness, and I agree with the above. -Quillian Quince, MD

## 2019-01-22 DIAGNOSIS — Z6838 Body mass index (BMI) 38.0-38.9, adult: Secondary | ICD-10-CM | POA: Diagnosis not present

## 2019-01-22 DIAGNOSIS — Z1231 Encounter for screening mammogram for malignant neoplasm of breast: Secondary | ICD-10-CM | POA: Diagnosis not present

## 2019-01-22 DIAGNOSIS — Z793 Long term (current) use of hormonal contraceptives: Secondary | ICD-10-CM | POA: Diagnosis not present

## 2019-01-22 DIAGNOSIS — Z01419 Encounter for gynecological examination (general) (routine) without abnormal findings: Secondary | ICD-10-CM | POA: Diagnosis not present

## 2019-01-22 DIAGNOSIS — D649 Anemia, unspecified: Secondary | ICD-10-CM | POA: Diagnosis not present

## 2019-01-22 DIAGNOSIS — F5104 Psychophysiologic insomnia: Secondary | ICD-10-CM | POA: Diagnosis not present

## 2019-01-22 DIAGNOSIS — R635 Abnormal weight gain: Secondary | ICD-10-CM | POA: Diagnosis not present

## 2019-02-02 NOTE — Progress Notes (Signed)
Office: [phone]  /  Fax: [phone]    Date: February 04, 2019   Time Seen: 4:04pm Duration: 28 minutes Provider: Lawerance Cruel, Psy.D. Type of Session: Individual Therapy  Type of Contact: Face-to-face  Session Content: Katelyn Walker is a 48 y.o. female presenting for a follow-up appointment to address the previously established treatment goal of decreasing emotional eating as well as termination. The session was initiated with the administration of the PHQ-9 and GAD-7, as well as a brief check-in. Katelyn Walker shared, "It's going pretty good [referring to eating]." She shared she was sick and her husband tried to "help," which involved him picking up food. In addition, Katelyn Walker discussed using the previously shared seasonings for her protein. She further discussed using the hunger and satisfaction scale, especially when she was sick. Katelyn Walker described the scale helped increase further awareness. She further discussed engaging in mindfulness. She added, "It's been a good experience." Due to ongoing sleeping difficulties, psychoeducation regarding sleep hygiene was provided, and Katelyn Walker was given a handout. Katelyn Walker was receptive to today's session as evidenced by openness to sharing, responsiveness to feedback, and willingness to discuss mindfulness further and implement sleep hygiene strategies.  Mental Status Examination: Katelyn Walker arrived on time for the appointment. She presented as appropriately dressed and groomed. Katelyn Walker appeared her stated age and demonstrated adequate orientation to time, place, person, and purpose of the appointment. She also demonstrated appropriate eye contact. No psychomotor abnormalities or behavioral peculiarities noted. Her mood was euthymic with congruent affect. Her thought processes were logical, linear, and goal-directed. No hallucinations, delusions, bizarre thinking or behavior reported or observed. Judgment, insight, and impulse control appeared to be grossly intact.  There was no evidence of paraphasias (i.e., errors in speech, gross mispronunciations, and word substitutions), repetition deficits, or disturbances in volume or prosody (i.e., rhythm and intonation). There was no evidence of attention or memory impairments. Katelyn Walker denied current suicidal and homicidal ideation, plan and intent.   Structured Assessment Results: The Patient Health Questionnaire-9 (PHQ-9) is a self-report measure that assesses symptoms and severity of depression over the course of the last two weeks. Katelyn Walker obtained a score of 2 suggesting minimal depression. Katelyn Walker finds the endorsed symptoms to be not difficult at all. Depression screen [name] 2/9 02/04/2019  Decreased Interest 0  Down, Depressed, Hopeless 0  PHQ - 2 Score 0  Altered sleeping 2  Tired, decreased energy 0  Change in appetite 0  Feeling bad or failure about yourself  0  Trouble concentrating 0  Moving slowly or fidgety/restless 0  Suicidal thoughts 0  PHQ-9 Score 2  Difficult doing work/chores -   The Generalized Anxiety Disorder-7 (GAD-7) is a brief self-report measure that assesses symptoms of anxiety over the course of the last two weeks. Katelyn Walker obtained a score of 1 suggesting minimal anxiety. GAD 7 : Generalized Anxiety Score 02/04/2019  Nervous, Anxious, on Edge 1  Control/stop worrying 0  Worry too much - different things 0  Trouble relaxing 0  Restless 0  Easily annoyed or irritable 0  Afraid - awful might happen 0  Total GAD 7 Score 1  Anxiety Difficulty Not difficult at all   Interventions:  Administration of PHQ-9 and GAD-7 for symptom monitoring Review of content from the previous session Empathic reflections and validation Psychoeducation regarding sleep hygiene Positive reinforcement Brief chart review  DSM-5 Diagnosis: 311 (F32.8) Other Specified Depressive Disorder, Emotional Eating Behaviors  Treatment Goal & Progress: During the initial appointment with this provider, the  following treatment goal was established:  decrease emotional eating. Katelyn Walker has demonstrated progress in her goal as evidenced by increased awareness of hunger patterns and triggers for emotional eating. Over the course of treatment, there has been a reduction in emotional eating as Katelyn Walker's awareness has increased. She also continues to demonstrate willingness to engage in learned skills.  Plan: Today's Katelyn Walker's last appointment with this provider.

## 2019-02-04 ENCOUNTER — Encounter (INDEPENDENT_AMBULATORY_CARE_PROVIDER_SITE_OTHER): Payer: Self-pay | Admitting: Family Medicine

## 2019-02-04 ENCOUNTER — Ambulatory Visit (INDEPENDENT_AMBULATORY_CARE_PROVIDER_SITE_OTHER): Payer: BLUE CROSS/BLUE SHIELD | Admitting: Family Medicine

## 2019-02-04 ENCOUNTER — Ambulatory Visit (INDEPENDENT_AMBULATORY_CARE_PROVIDER_SITE_OTHER): Payer: BLUE CROSS/BLUE SHIELD | Admitting: Psychology

## 2019-02-04 VITALS — BP 119/82 | HR 78 | Temp 98.1°F | Ht 67.0 in | Wt 237.0 lb

## 2019-02-04 DIAGNOSIS — F3289 Other specified depressive episodes: Secondary | ICD-10-CM

## 2019-02-04 DIAGNOSIS — R7303 Prediabetes: Secondary | ICD-10-CM | POA: Diagnosis not present

## 2019-02-04 DIAGNOSIS — E559 Vitamin D deficiency, unspecified: Secondary | ICD-10-CM

## 2019-02-04 DIAGNOSIS — Z6837 Body mass index (BMI) 37.0-37.9, adult: Secondary | ICD-10-CM | POA: Diagnosis not present

## 2019-02-04 DIAGNOSIS — Z9189 Other specified personal risk factors, not elsewhere classified: Secondary | ICD-10-CM | POA: Diagnosis not present

## 2019-02-04 MED ORDER — METFORMIN HCL 500 MG PO TABS: 500.0000 mg | ORAL_TABLET | Freq: Every day | ORAL | 0 refills | Status: DC

## 2019-02-04 MED ORDER — BUPROPION HCL ER (SR) 200 MG PO TB12: 200.0000 mg | ORAL_TABLET | Freq: Two times a day (BID) | ORAL | 0 refills | Status: DC

## 2019-02-04 MED ORDER — VITAMIN D (ERGOCALCIFEROL) 1.25 MG (50000 UNIT) PO CAPS: 50000.0000 [IU] | ORAL_CAPSULE | ORAL | 0 refills | Status: DC

## 2019-02-05 NOTE — Progress Notes (Signed)
Office: [phone]  /  Fax: [phone]   HPI:   Chief Complaint: OBESITY Katelyn Walker is here to discuss her progress with her obesity treatment plan. She is on the Category 2 plan and is following her eating plan approximately 25 % of the time. She states she is exercising 0 minutes 0 times per week. Katelyn Walker has done well maintaining weight. She has struggled more with increased temptations and is still working on meeting her protein goals.  Her weight is 237 lb (107.5 kg) today and has not lost weight since her last visit. She has lost 5 lbs since starting treatment with [name].  Depression with emotional eating behaviors Katelyn Walker's mood is stable and her blood pressure is controlled. She is struggling with emotional eating and using food for comfort to the extent that it is negatively impacting her health. She often snacks when she is not hungry. Katelyn Walker sometimes feels she is out of control and then feels guilty that she made poor food choices. She has been working on behavior modification techniques to help reduce her emotional eating and has been somewhat successful.   Pre-Diabetes Katelyn Walker has a diagnosis of pre-diabetes based on her elevated Hgb A1c and was informed this puts her at greater risk of developing diabetes. She is stable on metformin currently and is working on her diet and exercise to decrease risk of diabetes. She denies nausea, vomiting, or hypoglycemia.  At risk for diabetes Katelyn Walker is at higher than average risk for developing diabetes due to her pre-diabetes and obesity. She currently denies polyuria or polydipsia.  Vitamin D deficiency Katelyn Walker has a diagnosis of vitamin D deficiency. She is currently taking vit D and is now at goal. She denies nausea, vomiting, or muscle weakness.  ASSESSMENT AND PLAN:  Prediabetes - Plan: metFORMIN (GLUCOPHAGE) 500 MG tablet  Vitamin D deficiency - Plan: Vitamin D, Ergocalciferol, (DRISDOL) 1.25 MG (50000 UT) CAPS capsule  Other  depression - with emotional eating - Plan: buPROPion (WELLBUTRIN SR) 200 MG 12 hr tablet  At risk for diabetes mellitus  Class 2 severe obesity with serious comorbidity and body mass index (BMI) of 37.0 to 37.9 in adult, unspecified obesity type [facility])  PLAN:  Pre-Diabetes Katelyn Walker will continue to work on weight loss, exercise, and decreasing simple carbohydrates in her diet to help decrease the risk of diabetes. She was informed that eating too many simple carbohydrates or too many calories at one sitting increases the likelihood of GI side effects. Katelyn Walker agreed to continue metformin 500mg  with breakfast #60 with no refills and a prescription was written today. Ilah agreed to follow up with [name] as directed to monitor her progress in 3 weeks.  Diabetes risk counseling Darletha was given extended (15 minutes) diabetes prevention counseling today. She is 48 y.o. female and has risk factors for diabetes including pre-diabetes and obesity. We discussed intensive lifestyle modifications today with an emphasis on weight loss as well as increasing exercise and decreasing simple carbohydrates in her diet.  Vitamin D Deficiency Katelyn Walker was informed that low vitamin D levels contributes to fatigue and are associated with obesity, breast, and colon cancer. Glendolyn agrees to continue to take prescription Vit D @50 ,000 IU every week #4 with no refills and will follow up for routine testing of vitamin D, at least 2-3 times per year. She was informed of the risk of over-replacement of vitamin D and agrees to not increase her dose unless she discusses this with [name] first. Katelyn Walker agrees to follow up in  3 weeks as directed.  Depression with Emotional Eating Behaviors We discussed behavior modification techniques today to help Katelyn Walker deal with her emotional eating and depression. She has agreed to continue to take Wellbutrin SR 200mg  BID #60 with no refills and agreed to follow up as directed.  Obesity Katelyn Walker  is currently in the action stage of change. As such, her goal is to continue with weight loss efforts. She has agreed to follow the Category 2 plan. Katelyn Walker has been instructed to work up to a goal of 150 minutes of combined cardio and strengthening exercise per week for weight loss and overall health benefits. We discussed the following Behavioral Modification Strategies today: increasing lean protein intake, decreasing simple carbohydrates, work on meal planning and easy cooking plans, better snacking choices, and dealing with family or coworker sabotage.  Katelyn Walker has agreed to follow up with our clinic in 3 weeks. She was informed of the importance of frequent follow up visits to maximize her success with intensive lifestyle modifications for her multiple health conditions.  ALLERGIES: Allergies  Allergen Reactions  . Contrave [Naltrexone-Bupropion Hcl Er] Other (See Comments)    Vertigo/ dizziness    MEDICATIONS: Current Outpatient Medications on File Prior to Visit  Medication Sig Dispense Refill  . ipratropium (ATROVENT) 0.03 % nasal spray Place 2 sprays into both nostrils every 12 (twelve) hours. 30 mL 0  . levothyroxine (SYNTHROID, LEVOTHROID) 75 MCG tablet Take 75 mcg by mouth daily before breakfast.    . Norethindrone Acetate-Ethinyl Estrad-FE (BLISOVI 24 FE) 1-20 MG-MCG(24) tablet Take 1 tablet by mouth daily.    Marland Kitchen. omeprazole (PRILOSEC) 40 MG capsule Take 40 mg by mouth daily.    . valsartan (DIOVAN) 80 MG tablet Take 80 mg by mouth daily.    Marland Kitchen. zolpidem (AMBIEN) 10 MG tablet Take 10 mg by mouth at bedtime as needed for sleep.     No current facility-administered medications on file prior to visit.     PAST MEDICAL HISTORY: Past Medical History:  Diagnosis Date  . Back pain   . Dyspnea   . Elevated blood pressure reading without diagnosis of hypertension   . Esophageal ulcer   . GERD (gastroesophageal reflux disease)   . Hot flashes   . Hypothyroid   . Joint pain   .  Sciatica, left side   . Vitamin B 12 deficiency   . Vitamin D deficiency     PAST SURGICAL HISTORY: Past Surgical History:  Procedure Laterality Date  . DILATION AND CURETTAGE OF UTERUS     2011   . OVARIAN CYST SURGERY     2009, 2013    SOCIAL HISTORY: Social History   Tobacco Use  . Smoking status: Never Smoker  . Smokeless tobacco: Never Used  Substance Use Topics  . Alcohol use: Not on file  . Drug use: Not on file    FAMILY HISTORY: Family History  Problem Relation Age of Onset  . Alcoholism Mother   . Eating disorder Mother   . Diabetes Father   . Hypertension Father   . Sleep apnea Father     ROS: Review of Systems  Constitutional: Negative for weight loss.  Gastrointestinal: Negative for nausea and vomiting.  Genitourinary:       Negative for polyuria.  Musculoskeletal:       Negative for muscle weakness.  Endo/Heme/Allergies: Negative for polydipsia.       Negative for hypoglycemia.  Psychiatric/Behavioral: Positive for depression. The patient does not have insomnia.  PHYSICAL EXAM: Blood pressure 119/82, pulse 78, temperature 98.1 F (36.7 C), temperature source Oral, height 5\' 7"  (1.702 m), weight 237 lb (107.5 kg), SpO2 98 %. Body mass index is 37.12 kg/m. Physical Exam Vitals signs reviewed.  Constitutional:      Appearance: Normal appearance. She is obese.  Cardiovascular:     Rate and Rhythm: Normal rate.  Pulmonary:     Effort: Pulmonary effort is normal.  Musculoskeletal: Normal range of motion.  Skin:    General: Skin is warm and dry.  Neurological:     Mental Status: She is alert and oriented to person, place, and time.  Psychiatric:        Mood and Affect: Mood normal.        Behavior: Behavior normal.     RECENT LABS AND TESTS: BMET    Component Value Date/Time   NA 140 10/13/2018 1002   K 4.4 10/13/2018 1002   CL 103 10/13/2018 1002   CO2 22 10/13/2018 1002   GLUCOSE 96 10/13/2018 1002   BUN 9 10/13/2018 1002    CREATININE 1.07 (H) 10/13/2018 1002   CALCIUM 8.9 10/13/2018 1002   GFRNONAA 62 10/13/2018 1002   GFRAA 71 10/13/2018 1002   Lab Results  Component Value Date   HGBA1C 5.8 (H) 10/13/2018   HGBA1C 5.8 (H) 05/14/2018   Lab Results  Component Value Date   INSULIN 17.4 10/13/2018   INSULIN 18.3 05/14/2018   CBC    Component Value Date/Time   WBC 8.7 05/14/2018 1138   WBC 13.7 (H) 09/09/2007 0546   RBC 4.96 05/14/2018 1138   RBC 3.11 (L) 09/09/2007 0546   HGB 12.0 05/14/2018 1138   HCT 38.8 05/14/2018 1138   PLT 197 09/09/2007 0546   MCV 78 (L) 05/14/2018 1138   MCH 24.2 (L) 05/14/2018 1138   MCHC 30.9 (L) 05/14/2018 1138   MCHC 33.6 09/09/2007 0546   RDW 16.7 (H) 05/14/2018 1138   LYMPHSABS 1.8 05/14/2018 1138   EOSABS 0.1 05/14/2018 1138   BASOSABS 0.0 05/14/2018 1138   Iron/TIBC/Ferritin/ %Sat No results found for: IRON, TIBC, FERRITIN, IRONPCTSAT Lipid Panel     Component Value Date/Time   CHOL 171 05/14/2018 1138   TRIG 145 05/14/2018 1138   HDL 44 05/14/2018 1138   LDLCALC 98 05/14/2018 1138   Hepatic Function Panel     Component Value Date/Time   PROT 6.2 10/13/2018 1002   ALBUMIN 3.9 10/13/2018 1002   AST 12 10/13/2018 1002   ALT 9 10/13/2018 1002   ALKPHOS 69 10/13/2018 1002   BILITOT 0.2 10/13/2018 1002      Component Value Date/Time   TSH 2.830 05/14/2018 1138   Results for VELOURIA, GUPPY (MRN [id number]) as of 02/05/2019 08:32  Ref. Range 10/13/2018 10:02  Vitamin D, 25-Hydroxy Latest Ref Range: 30.0 - 100.0 ng/mL 49.5    OBESITY BEHAVIORAL INTERVENTION VISIT  Today's visit was # 13   Starting weight: 242 lbs Starting date: 05/14/18 Today's weight : Weight: 237 lb (107.5 kg)  Today's date: 02/04/2019 Total lbs lost to date: 5    02/04/2019  Height 5\' 7"  (1.702 m)  Weight 237 lb (107.5 kg)  BMI (Calculated) 37.11  BLOOD PRESSURE - SYSTOLIC 119  BLOOD PRESSURE - DIASTOLIC 82   Body Fat % 38.8 %  Total Body Water (lbs) 92 lbs    ASK: We discussed the diagnosis of obesity with Gevena Barre today and [place] agreed to give [name] permission to discuss  obesity behavioral modification therapy today.  ASSESS: Katelyn Walker has the diagnosis of obesity and her BMI today is 37.1. Katelyn Walker is in the action stage of change.   ADVISE: Katelyn Walker was educated on the multiple health risks of obesity as well as the benefit of weight loss to improve her health. She was advised of the need for long term treatment and the importance of lifestyle modifications to improve her current health and to decrease her risk of future health problems.  AGREE: Multiple dietary modification options and treatment options were discussed and Katelyn Walker agreed to follow the recommendations documented in the above note.  ARRANGE: Katelyn Walker was educated on the importance of frequent visits to treat obesity as outlined per CMS and USPSTF guidelines and agreed to schedule her next follow up appointment today.  IKirke Corin, Cara Soares, CMA, am acting as transcriptionist for Wilder Gladearen D. Beasley, MD  I have reviewed the above documentation for accuracy and completeness, and I agree with the above. -Quillian Quincearen Beasley, MD

## 2019-02-25 ENCOUNTER — Ambulatory Visit (INDEPENDENT_AMBULATORY_CARE_PROVIDER_SITE_OTHER): Payer: Self-pay | Admitting: Family Medicine

## 2019-03-10 ENCOUNTER — Encounter (INDEPENDENT_AMBULATORY_CARE_PROVIDER_SITE_OTHER): Payer: Self-pay

## 2019-03-11 ENCOUNTER — Encounter (INDEPENDENT_AMBULATORY_CARE_PROVIDER_SITE_OTHER): Payer: Self-pay | Admitting: Family Medicine

## 2019-03-11 ENCOUNTER — Other Ambulatory Visit: Payer: Self-pay

## 2019-03-11 ENCOUNTER — Ambulatory Visit (INDEPENDENT_AMBULATORY_CARE_PROVIDER_SITE_OTHER): Payer: BLUE CROSS/BLUE SHIELD | Admitting: Family Medicine

## 2019-03-11 DIAGNOSIS — E559 Vitamin D deficiency, unspecified: Secondary | ICD-10-CM

## 2019-03-11 DIAGNOSIS — F3289 Other specified depressive episodes: Secondary | ICD-10-CM

## 2019-03-11 DIAGNOSIS — R7303 Prediabetes: Secondary | ICD-10-CM | POA: Diagnosis not present

## 2019-03-11 DIAGNOSIS — E038 Other specified hypothyroidism: Secondary | ICD-10-CM | POA: Diagnosis not present

## 2019-03-11 DIAGNOSIS — Z6837 Body mass index (BMI) 37.0-37.9, adult: Secondary | ICD-10-CM

## 2019-03-11 MED ORDER — BUPROPION HCL ER (SR) 200 MG PO TB12: 200.0000 mg | ORAL_TABLET | Freq: Two times a day (BID) | ORAL | 0 refills | Status: DC

## 2019-03-11 MED ORDER — VITAMIN D (ERGOCALCIFEROL) 1.25 MG (50000 UNIT) PO CAPS: 50000.0000 [IU] | ORAL_CAPSULE | ORAL | 0 refills | Status: DC

## 2019-03-11 MED ORDER — METFORMIN HCL 500 MG PO TABS: 500.0000 mg | ORAL_TABLET | Freq: Every day | ORAL | 0 refills | Status: DC

## 2019-03-12 ENCOUNTER — Encounter (INDEPENDENT_AMBULATORY_CARE_PROVIDER_SITE_OTHER): Payer: Self-pay | Admitting: Family Medicine

## 2019-03-12 DIAGNOSIS — R7303 Prediabetes: Secondary | ICD-10-CM | POA: Insufficient documentation

## 2019-03-12 DIAGNOSIS — Z6837 Body mass index (BMI) 37.0-37.9, adult: Secondary | ICD-10-CM

## 2019-03-12 DIAGNOSIS — Z6835 Body mass index (BMI) 35.0-35.9, adult: Secondary | ICD-10-CM | POA: Insufficient documentation

## 2019-03-12 DIAGNOSIS — F329 Major depressive disorder, single episode, unspecified: Secondary | ICD-10-CM | POA: Insufficient documentation

## 2019-03-12 DIAGNOSIS — F32A Depression, unspecified: Secondary | ICD-10-CM | POA: Insufficient documentation

## 2019-03-12 NOTE — Progress Notes (Addendum)
Office: [phone]  /  Fax: [phone] TeleHealth Visit (FaceTime):  Katelyn Walker has consented to this TeleHealth visit today via telephone call. The patient is located at home, the provider is located at the [facility] office. The participants in this visit include the listed provider and patient and any and all parties involved.    HPI:   Chief Complaint: OBESITY Katelyn Walker is here to discuss her progress with her obesity treatment plan. She is on the Category 2 plan and is following her eating plan approximately 50 % of the time. She states she is walking for 30 minutes 1 time per week. Katelyn Walker feels her weight is the same. She is cooking more at home. Katelyn Walker has increased stress related to COVID-19 that causes cravings. We were unable to weight the patient today for this TeleHealth visit.She feels as if she has gained 3 pounds  since her last visit. She has lost 2 lbs since starting treatment with [name].  Vitamin D deficiency Katelyn Walker has a diagnosis of vitamin D deficiency. Her last vitamin D level was at 49.5 on 10/13/18 and is not at goal. She is currently taking vit D and denies nausea, vomiting or muscle weakness.  Pre-Diabetes Katelyn Walker has a diagnosis of prediabetes based on her elevated Hgb A1c and was informed this puts her at greater risk of developing diabetes. She is taking metformin currently and continues to work on diet and exercise to decrease risk of diabetes. She had decreased polyphagia recently and she denies hypoglycemia. Lab Results  Component Value Date   HGBA1C 5.8 (H) 10/13/2018    Hypothyroidism Katelyn Walker has a diagnosis of hypothyroidism. She is taking 75 mcg of synthroid daily. She denies hot or cold intolerance or palpitations. Lab Results  Component Value Date   TSH 2.830 05/14/2018    Depression with emotional eating behaviors Katelyn Walker feels the Bupropion helps with stress eating. She concluded her counseling with Dr. Dewaine Conger. She  has been working on behavior modification techniques to help reduce her emotional eating and has been somewhat successful. She shows no sign of suicidal or homicidal ideations.  Depression screen [facility] 2/9 02/04/2019 01/14/2019 12/24/2018 11/25/2018 10/22/2018  Decreased Interest 0 0 0 0 0  Down, Depressed, Hopeless 0 0 0 0 0  PHQ - 2 Score 0 0 0 0 0  Altered sleeping 2 2 1 2 1   Tired, decreased energy 0 0 1 0 1  Change in appetite 0 0 0 0 1  Feeling bad or failure about yourself  0 0 0 1 1  Trouble concentrating 0 0 0 0 0  Moving slowly or fidgety/restless 0 1 0 0 0  Suicidal thoughts 0 0 0 0 0  PHQ-9 Score 2 3 2 3 4   Difficult doing work/chores - - - - -    ASSESSMENT AND PLAN:  Vitamin D deficiency - Plan: Vitamin D, Ergocalciferol, (DRISDOL) 1.25 MG (50000 UT) CAPS capsule, VITAMIN D 25 Hydroxy (Vit-D Deficiency, Fractures)  Prediabetes - Plan: metFORMIN (GLUCOPHAGE) 500 MG tablet, Lipid Panel With LDL/HDL Ratio, Hemoglobin H8N, Insulin, random, Comprehensive metabolic panel  Other specified hypothyroidism - Plan: TSH, Lipid Panel With LDL/HDL Ratio  Other depression - with emotional eating - Plan: buPROPion (WELLBUTRIN SR) 200 MG 12 hr tablet  Class 2 severe obesity with serious comorbidity and body mass index (BMI) of 37.0 to 37.9 in adult, unspecified obesity type (HCC)  PLAN:  Vitamin D Deficiency Katelyn Walker was informed that low vitamin D levels contributes to  fatigue and are associated with obesity, breast, and colon cancer. She agrees to continue to take prescription Vit D ,000 IU every week #4 with no refills and will follow up for routine testing of vitamin D, at least 2-3 times per year. She was informed of the risk of over-replacement of vitamin D and agrees to not increase her dose unless she discusses this with [name] first. We will check vitamin D level today and Katelyn Walker agrees to follow up as directed.  Pre-Diabetes Katelyn Walker will continue to work on weight loss, exercise,  and decreasing simple carbohydrates in her diet to help decrease the risk of diabetes. We dicussed metformin including benefits and risks. She was informed that eating too many simple carbohydrates or too many calories at one sitting increases the likelihood of GI side effects. Katelyn Walker agreed to continue metformin 500 mg daily #30 with no refills and follow up with [name] as directed to monitor her progress.  Hypothyroidism Katelyn Walker was informed of the importance of good thyroid control to help with weight loss efforts. She was also informed that supertherapeutic thyroid levels are dangerous and will not improve weight loss results.  We will check TSH today and Katelyn Walker will continue synthroid at 75 mcg daily.  Depression with Emotional Eating Behaviors We discussed behavior modification techniques today to help Katelyn Walker deal with her emotional eating and depression. She agreed to continue Bupropion (Wellbutrin SR) 200 mg BID #60 with no refills and follow up as directed.  Obesity Katelyn Walker is currently in the action stage of change. As such, her goal is to continue with weight loss efforts She has agreed to keep a food journal with 1200 to 1300 calories and 80 to 90 grams of protein daily or follow the Category 2 plan Katelyn Walker will continue current exercise regimen for weight loss and overall health benefits. We discussed the following Behavioral Modification Strategies today: planning for success and increasing lean protein intake  Katelyn Walker will weigh at home on her virtual appointment days. She may journal.  Katelyn Walker has agreed to follow up with our clinic in 2 to 3 weeks. She was informed of the importance of frequent follow up visits to maximize her success with intensive lifestyle modifications for her multiple health conditions.  ALLERGIES: Allergies  Allergen Reactions  . Contrave [Naltrexone-Bupropion Hcl Er] Other (See Comments)    Vertigo/ dizziness    MEDICATIONS: Current Outpatient  Medications on File Prior to Visit  Medication Sig Dispense Refill  . ipratropium (ATROVENT) 0.03 % nasal spray Place 2 sprays into both nostrils every 12 (twelve) hours. 30 mL 0  . levothyroxine (SYNTHROID, LEVOTHROID) 75 MCG tablet Take 75 mcg by mouth daily before breakfast.    . Norethindrone Acetate-Ethinyl Estrad-FE (BLISOVI 24 FE) 1-20 MG-MCG(24) tablet Take 1 tablet by mouth daily.    Marland Kitchen omeprazole (PRILOSEC) 40 MG capsule Take 40 mg by mouth daily.    . valsartan (DIOVAN) 80 MG tablet Take 80 mg by mouth daily.    Marland Kitchen zolpidem (AMBIEN) 10 MG tablet Take 10 mg by mouth at bedtime as needed for sleep.     No current facility-administered medications on file prior to visit.     PAST MEDICAL HISTORY: Past Medical History:  Diagnosis Date  . Back pain   . Dyspnea   . Elevated blood pressure reading without diagnosis of hypertension   . Esophageal ulcer   . GERD (gastroesophageal reflux disease)   . Hot flashes   . Hypothyroid   . Joint pain   .  Sciatica, left side   . Vitamin B 12 deficiency   . Vitamin D deficiency     PAST SURGICAL HISTORY: Past Surgical History:  Procedure Laterality Date  . DILATION AND CURETTAGE OF UTERUS     2011   . OVARIAN CYST SURGERY     2009, 2013    SOCIAL HISTORY: Social History   Tobacco Use  . Smoking status: Never Smoker  . Smokeless tobacco: Never Used  Substance Use Topics  . Alcohol use: Not on file  . Drug use: Not on file    FAMILY HISTORY: Family History  Problem Relation Age of Onset  . Alcoholism Mother   . Eating disorder Mother   . Diabetes Father   . Hypertension Father   . Sleep apnea Father     ROS: Review of Systems  Constitutional: Negative for weight loss.  Cardiovascular: Negative for palpitations.  Gastrointestinal: Negative for nausea and vomiting.  Musculoskeletal:       Negative for muscle weakness  Endo/Heme/Allergies:       Positive for cravings Negative for hypoglycemia Negative for heat or  cold intolerance  Psychiatric/Behavioral: Positive for depression. Negative for suicidal ideas.       Positive for stress    PHYSICAL EXAM: Pt in no acute distress  RECENT LABS AND TESTS: BMET    Component Value Date/Time   NA 140 10/13/2018 1002   K 4.4 10/13/2018 1002   CL 103 10/13/2018 1002   CO2 22 10/13/2018 1002   GLUCOSE 96 10/13/2018 1002   BUN 9 10/13/2018 1002   CREATININE 1.07 (H) 10/13/2018 1002   CALCIUM 8.9 10/13/2018 1002   GFRNONAA 62 10/13/2018 1002   GFRAA 71 10/13/2018 1002   Lab Results  Component Value Date   HGBA1C 5.8 (H) 10/13/2018   HGBA1C 5.8 (H) 05/14/2018   Lab Results  Component Value Date   INSULIN 17.4 10/13/2018   INSULIN 18.3 05/14/2018   CBC    Component Value Date/Time   WBC 8.7 05/14/2018 1138   WBC 13.7 (H) 09/09/2007 0546   RBC 4.96 05/14/2018 1138   RBC 3.11 (L) 09/09/2007 0546   HGB 12.0 05/14/2018 1138   HCT 38.8 05/14/2018 1138   PLT 197 09/09/2007 0546   MCV 78 (L) 05/14/2018 1138   MCH 24.2 (L) 05/14/2018 1138   MCHC 30.9 (L) 05/14/2018 1138   MCHC 33.6 09/09/2007 0546   RDW 16.7 (H) 05/14/2018 1138   LYMPHSABS 1.8 05/14/2018 1138   EOSABS 0.1 05/14/2018 1138   BASOSABS 0.0 05/14/2018 1138   Iron/TIBC/Ferritin/ %Sat No results found for: IRON, TIBC, FERRITIN, IRONPCTSAT Lipid Panel     Component Value Date/Time   CHOL 171 05/14/2018 1138   TRIG 145 05/14/2018 1138   HDL 44 05/14/2018 1138   LDLCALC 98 05/14/2018 1138   Hepatic Function Panel     Component Value Date/Time   PROT 6.2 10/13/2018 1002   ALBUMIN 3.9 10/13/2018 1002   AST 12 10/13/2018 1002   ALT 9 10/13/2018 1002   ALKPHOS 69 10/13/2018 1002   BILITOT 0.2 10/13/2018 1002      Component Value Date/Time   TSH 2.830 05/14/2018 1138     Ref. Range 10/13/2018 10:02  Vitamin D, 25-Hydroxy Latest Ref Range: 30.0 - 100.0 ng/mL 49.5     I, [place] Crane, am acting as Energy manager for [place], FNP-C.  I have reviewed the above  documentation for accuracy and completeness, and I agree with the above.  -   , FNP-C.

## 2019-03-16 ENCOUNTER — Encounter (INDEPENDENT_AMBULATORY_CARE_PROVIDER_SITE_OTHER): Payer: Self-pay

## 2019-03-16 ENCOUNTER — Encounter (INDEPENDENT_AMBULATORY_CARE_PROVIDER_SITE_OTHER): Payer: Self-pay | Admitting: Family Medicine

## 2019-03-16 NOTE — Telephone Encounter (Signed)
Please address

## 2019-03-17 NOTE — Telephone Encounter (Signed)
Please schedule

## 2019-03-19 DIAGNOSIS — R7303 Prediabetes: Secondary | ICD-10-CM | POA: Diagnosis not present

## 2019-03-19 DIAGNOSIS — E038 Other specified hypothyroidism: Secondary | ICD-10-CM | POA: Diagnosis not present

## 2019-03-19 DIAGNOSIS — E559 Vitamin D deficiency, unspecified: Secondary | ICD-10-CM | POA: Diagnosis not present

## 2019-03-20 LAB — COMPREHENSIVE METABOLIC PANEL
ALT: 6 IU/L (ref 0–32)
AST: 10 IU/L (ref 0–40)
Albumin/Globulin Ratio: 1.4 (ref 1.2–2.2)
Albumin: 3.8 g/dL (ref 3.8–4.8)
Alkaline Phosphatase: 72 IU/L (ref 39–117)
BUN/Creatinine Ratio: 8 — ABNORMAL LOW (ref 9–23)
BUN: 8 mg/dL (ref 6–24)
Bilirubin Total: 0.2 mg/dL (ref 0.0–1.2)
CO2: 22 mmol/L (ref 20–29)
Calcium: 8.5 mg/dL — ABNORMAL LOW (ref 8.7–10.2)
Chloride: 102 mmol/L (ref 96–106)
Creatinine, Ser: 0.97 mg/dL (ref 0.57–1.00)
GFR calc Af Amer: 80 mL/min/{1.73_m2} (ref >59)
GFR calc non Af Amer: 69 mL/min/{1.73_m2} (ref >59)
Globulin, Total: 2.7 g/dL (ref 1.5–4.5)
Glucose: 80 mg/dL (ref 65–99)
Potassium: 4.7 mmol/L (ref 3.5–5.2)
Sodium: 140 mmol/L (ref 134–144)
Total Protein: 6.5 g/dL (ref 6.0–8.5)

## 2019-03-20 LAB — LIPID PANEL WITH LDL/HDL RATIO
Cholesterol, Total: 170 mg/dL (ref 100–199)
HDL: 34 mg/dL — ABNORMAL LOW (ref >39)
LDL Calculated: 105 mg/dL — ABNORMAL HIGH (ref 0–99)
LDl/HDL Ratio: 3.1 ratio (ref 0.0–3.2)
Triglycerides: 157 mg/dL — ABNORMAL HIGH (ref 0–149)
VLDL Cholesterol Cal: 31 mg/dL (ref 5–40)

## 2019-03-20 LAB — INSULIN, RANDOM: INSULIN: 17.7 u[IU]/mL (ref 2.6–24.9)

## 2019-03-20 LAB — VITAMIN D 25 HYDROXY (VIT D DEFICIENCY, FRACTURES): Vit D, 25-Hydroxy: 54.2 ng/mL (ref 30.0–100.0)

## 2019-03-20 LAB — TSH: TSH: 3.3 u[IU]/mL (ref 0.450–4.500)

## 2019-03-20 LAB — HEMOGLOBIN A1C
Est. average glucose Bld gHb Est-mCnc: 120 mg/dL
Hgb A1c MFr Bld: 5.8 % — ABNORMAL HIGH (ref 4.8–5.6)

## 2019-03-24 ENCOUNTER — Ambulatory Visit (INDEPENDENT_AMBULATORY_CARE_PROVIDER_SITE_OTHER): Payer: BLUE CROSS/BLUE SHIELD | Admitting: Family Medicine

## 2019-03-24 ENCOUNTER — Encounter (INDEPENDENT_AMBULATORY_CARE_PROVIDER_SITE_OTHER): Payer: Self-pay | Admitting: Family Medicine

## 2019-03-24 ENCOUNTER — Other Ambulatory Visit: Payer: Self-pay

## 2019-03-24 DIAGNOSIS — E559 Vitamin D deficiency, unspecified: Secondary | ICD-10-CM | POA: Diagnosis not present

## 2019-03-24 DIAGNOSIS — F3289 Other specified depressive episodes: Secondary | ICD-10-CM | POA: Diagnosis not present

## 2019-03-24 DIAGNOSIS — Z6837 Body mass index (BMI) 37.0-37.9, adult: Secondary | ICD-10-CM

## 2019-03-24 MED ORDER — VITAMIN D3 50 MCG (2000 UT) PO TABS: 2000.0000 [IU] | ORAL_TABLET | Freq: Every day | ORAL | 0 refills | Status: DC

## 2019-03-24 NOTE — Progress Notes (Addendum)
Office: [phone]  /  Fax: [phone] TeleHealth Visit:  Gevena BarreSusanne G Walker has verbally consented to this TeleHealth visit today. The patient is located at home, the provider is located at the [facility] office. The participants in this visit include the listed provider and patient. The visit was conducted today via FaceTime.  HPI:   Chief Complaint: OBESITY Katelyn Walker is here to discuss her progress with her obesity treatment plan. She is keeping a food journal with 1200 calories and 80 grams of protein daily and is following her eating plan approximately 60% of the time. She states she is walking 1 mile 3 times per week. Katelyn Walker states she weighed 240 lbs on her home scale and feels she has maintained her weight. She also feels journaling is working better for her than a category plan. She states increased stress is causing stress eating. She reports meeting her protein goals. We were unable to weigh the patient today for this TeleHealth visit. She feels as if she has maintained her weight since her last visit. She has lost 5 lbs since starting treatment with us.  Depression with emotional eating behaviors Katelyn Walker is on bupropion 200 mg BID and she believes this helps with stress eating. She states she is trying to to choose healthy food options. She is aware of trigger points and states stress is fairly well controlled.  She has been working on behavior modification techniques to help reduce her emotional eating and has been somewhat successful. She shows no sign of suicidal or homicidal ideations.  [facility]/04/2019 01/14/2019 12/24/2018 11/25/2018 10/22/2018  Decreased Interest 0 0 0 0 0  Down, Depressed, Hopeless 0 0 0 0 0  PHQ - 2 Score 0 0 0 0 0  Altered sleeping 2 2 1 2 1   Tired, decreased energy 0 0 1 0 1  Change in appetite 0 0 0 0 1  Feeling bad or failure about yourself  0 0 0 1 1  Trouble concentrating 0 0 0 0 0  Moving slowly or  fidgety/restless 0 1 0 0 0  Suicidal thoughts 0 0 0 0 0  PHQ-9 Score 2 3 2 3 4   Difficult doing work/chores - - - - -   Vitamin D deficiency Katelyn Walker has a diagnosis of Vitamin D deficiency. Her Vitamin D level on 03/19/2019 was reported to be 54.2, which is at goal. She is currently taking prescription Vit D and denies nausea, vomiting or muscle weakness.  ASSESSMENT AND PLAN:  Vitamin D deficiency - Plan: Cholecalciferol (VITAMIN D3) 50 MCG (2000 UT) TABS  Other depression - with emotional eating  Class 2 severe obesity with serious comorbidity and body mass index (BMI) of 37.0 to 37.9 in adult, unspecified obesity type (HCC)  PLAN:  Depression with Emotional Eating Behaviors We discussed behavior modification techniques today to help Katelyn Walker deal with her emotional eating and depression. She will continue bupropion and emotional eating strategies.  Vitamin D Deficiency Katelyn Walker was informed that low Vitamin D levels contributes to fatigue and are associated with obesity, breast, and colon cancer. She will discontinue prescription Vitamin D and will start OTC Vitamin D3 2,000 IU daily and will follow-up for routine testing of Vitamin D, at least 2-3 times per year. She was informed of the risk of over-replacement of Vitamin D and agrees to not increase her dose unless she discusses this with us first. Katelyn Walker agrees to follow-up with our clinic in 2 weeks.  Obesity Katelyn Walker  is currently in the action stage of change. As such, her goal is to continue with weight loss efforts She has agreed to keep a food journal with 1200-1300 calories and 80 grams of protein daily. We reviewed her labs in depth. Tiare has been instructed to continue walking 1 mile 3 times per week. We discussed the following Behavioral Modification Strategies today: decreasing simple carbohydrates, emotional eating strategies, and planning for success.  Ramonica has agreed to follow-up with our clinic in 2 weeks. She  was informed of the importance of frequent follow-up visits to maximize her success with intensive lifestyle modifications for her multiple health conditions.  ALLERGIES: Allergies  Allergen Reactions  . Contrave [Naltrexone-Bupropion Hcl Er] Other (See Comments)    Vertigo/ dizziness    MEDICATIONS: Current Outpatient Medications on File Prior to Visit  Medication Sig Dispense Refill  . buPROPion (WELLBUTRIN SR) 200 MG 12 hr tablet Take 1 tablet (200 mg total) by mouth 2 (two) times daily. 60 tablet 0  . ipratropium (ATROVENT) 0.03 % nasal spray Place 2 sprays into both nostrils every 12 (twelve) hours. 30 mL 0  . levothyroxine (SYNTHROID, LEVOTHROID) 75 MCG tablet Take 75 mcg by mouth daily before breakfast.    . metFORMIN (GLUCOPHAGE) 500 MG tablet Take 1 tablet (500 mg total) by mouth daily with breakfast. 30 tablet 0  . Norethindrone Acetate-Ethinyl Estrad-FE (BLISOVI 24 FE) 1-20 MG-MCG(24) tablet Take 1 tablet by mouth daily.    Marland Kitchen omeprazole (PRILOSEC) 40 MG capsule Take 40 mg by mouth daily.    . valsartan (DIOVAN) 80 MG tablet Take 80 mg by mouth daily.    Marland Kitchen zolpidem (AMBIEN) 10 MG tablet Take 10 mg by mouth at bedtime as needed for sleep.     No current facility-administered medications on file prior to visit.     PAST MEDICAL HISTORY: Past Medical History:  Diagnosis Date  . Back pain   . Dyspnea   . Elevated blood pressure reading without diagnosis of hypertension   . Esophageal ulcer   . GERD (gastroesophageal reflux disease)   . Hot flashes   . Hypothyroid   . Joint pain   . Sciatica, left side   . Vitamin B 12 deficiency   . Vitamin D deficiency     PAST SURGICAL HISTORY: Past Surgical History:  Procedure Laterality Date  . DILATION AND CURETTAGE OF UTERUS     2011   . OVARIAN CYST SURGERY     2009, 2013    SOCIAL HISTORY: Social History   Tobacco Use  . Smoking status: Never Smoker  . Smokeless tobacco: Never Used  Substance Use Topics  . Alcohol  use: Not on file  . Drug use: Not on file    FAMILY HISTORY: Family History  Problem Relation Age of Onset  . Alcoholism Mother   . Eating disorder Mother   . Diabetes Father   . Hypertension Father   . Sleep apnea Father    ROS: Review of Systems  Gastrointestinal: Negative for nausea and vomiting.  Musculoskeletal:       Negative for muscle weakness.  Psychiatric/Behavioral: Positive for depression (emotional eating).   PHYSICAL EXAM: Pt in no acute distress  RECENT LABS AND TESTS: BMET    Component Value Date/Time   NA 140 03/19/2019 1035   K 4.7 03/19/2019 1035   CL 102 03/19/2019 1035   CO2 22 03/19/2019 1035   GLUCOSE 80 03/19/2019 1035   BUN 8 03/19/2019 1035   CREATININE  0.97 03/19/2019 1035   CALCIUM 8.5 (L) 03/19/2019 1035   GFRNONAA 69 03/19/2019 1035   GFRAA 80 03/19/2019 1035   Lab Results  Component Value Date   HGBA1C 5.8 (H) 03/19/2019   HGBA1C 5.8 (H) 10/13/2018   HGBA1C 5.8 (H) 05/14/2018   Lab Results  Component Value Date   INSULIN 17.7 03/19/2019   INSULIN 17.4 10/13/2018   INSULIN 18.3 05/14/2018   CBC    Component Value Date/Time   WBC 8.7 05/14/2018 1138   WBC 13.7 (H) 09/09/2007 0546   RBC 4.96 05/14/2018 1138   RBC 3.11 (L) 09/09/2007 0546   HGB 12.0 05/14/2018 1138   HCT 38.8 05/14/2018 1138   PLT 197 09/09/2007 0546   MCV 78 (L) 05/14/2018 1138   MCH 24.2 (L) 05/14/2018 1138   MCHC 30.9 (L) 05/14/2018 1138   MCHC 33.6 09/09/2007 0546   RDW 16.7 (H) 05/14/2018 1138   LYMPHSABS 1.8 05/14/2018 1138   EOSABS 0.1 05/14/2018 1138   BASOSABS 0.0 05/14/2018 1138   Iron/TIBC/Ferritin/ %Sat No results found for: IRON, TIBC, FERRITIN, IRONPCTSAT Lipid Panel     Component Value Date/Time   CHOL 170 03/19/2019 1035   TRIG 157 (H) 03/19/2019 1035   HDL 34 (L) 03/19/2019 1035   LDLCALC 105 (H) 03/19/2019 1035   Hepatic Function Panel     Component Value Date/Time   PROT 6.5 03/19/2019 1035   ALBUMIN 3.8 03/19/2019 1035    AST 10 03/19/2019 1035   ALT 6 03/19/2019 1035   ALKPHOS 72 03/19/2019 1035   BILITOT 0.2 03/19/2019 1035      Component Value Date/Time   TSH 3.300 03/19/2019 1035   TSH 2.830 05/14/2018 1138   Results for DORMA, EICHELBERGER (MRN [id number]) as of 03/24/2019 15:48  Ref. Range 03/19/2019 10:35  Vitamin D, 25-Hydroxy Latest Ref Range: 30.0 - 100.0 ng/mL 54.2    I, Marianna Payment, am acting as Energy manager for [place], FNP-C.  I have reviewed the above documentation for accuracy and completeness, and I agree with the above.  - Philbert Ocallaghan, FNP-C.

## 2019-03-25 ENCOUNTER — Encounter (INDEPENDENT_AMBULATORY_CARE_PROVIDER_SITE_OTHER): Payer: Self-pay | Admitting: Family Medicine

## 2019-04-08 ENCOUNTER — Encounter (INDEPENDENT_AMBULATORY_CARE_PROVIDER_SITE_OTHER): Payer: Self-pay | Admitting: Family Medicine

## 2019-04-08 ENCOUNTER — Ambulatory Visit (INDEPENDENT_AMBULATORY_CARE_PROVIDER_SITE_OTHER): Payer: BLUE CROSS/BLUE SHIELD | Admitting: Family Medicine

## 2019-04-08 ENCOUNTER — Other Ambulatory Visit: Payer: Self-pay

## 2019-04-08 DIAGNOSIS — F3289 Other specified depressive episodes: Secondary | ICD-10-CM

## 2019-04-08 DIAGNOSIS — Z6837 Body mass index (BMI) 37.0-37.9, adult: Secondary | ICD-10-CM

## 2019-04-08 DIAGNOSIS — E66812 Obesity, class 2: Secondary | ICD-10-CM

## 2019-04-08 DIAGNOSIS — R7303 Prediabetes: Secondary | ICD-10-CM | POA: Diagnosis not present

## 2019-04-08 MED ORDER — METFORMIN HCL 500 MG PO TABS: 500.0000 mg | ORAL_TABLET | Freq: Two times a day (BID) | ORAL | 0 refills | Status: DC

## 2019-04-08 MED ORDER — BUPROPION HCL ER (SR) 200 MG PO TB12: 200.0000 mg | ORAL_TABLET | Freq: Two times a day (BID) | ORAL | 0 refills | Status: DC

## 2019-04-09 NOTE — Progress Notes (Signed)
Office: [phone]  /  Fax: [phone] TeleHealth Visit:  Katelyn Walker has verbally consented to this TeleHealth visit today. The patient is located at home, the provider is located at the [facility] office. The participants in this visit include the listed provider and patient and any and all parties involved. The visit was conducted today via FaceTime.  HPI:   Chief Complaint: OBESITY Katelyn Walker is here to discuss her progress with her obesity treatment plan. She is on the keep a food journal with 1200 to 1300 calories and 80 grams of protein daily plan and is following her eating plan approximately 75 % of the time. She states she is walking 30 minutes 2 times per week. Katelyn Walker weighed 239 pounds this morning, reflecting a 1 pound loss. Katelyn Walker is journaling consistently. She likes journaling better than the Category 2 plan. We were unable to weigh the patient today for this TeleHealth visit. She feels as if she has lost weight since her last visit. She has lost 6 lbs since starting treatment with [name].  Pre-Diabetes Katelyn Walker has a diagnosis of prediabetes based on her elevated Hgb A1c and was informed this puts her at greater risk of developing diabetes. She is taking metformin currently and continues to work on diet and exercise to decrease risk of diabetes. Katelyn Walker admits to polyphagia in the afternoon and she denies  hypoglycemia.  Depression with emotional eating behaviors Katelyn Walker struggles with emotional eating and using food for comfort to the extent that it is negatively impacting her health. She often snacks when she is not hungry. Katelyn Walker sometimes feels she is out of control and then feels guilty that she made poor food choices. Her emotional eating is improved, but she still struggles with it at times. She feels that Bupropion helps. She has been working on behavior modification techniques to help reduce her emotional eating and has been somewhat successful. She  shows no sign of suicidal or homicidal ideations.  Depression screen [facility] 2/9 02/04/2019 01/14/2019 12/24/2018 11/25/2018 10/22/2018  Decreased Interest 0 0 0 0 0  Down, Depressed, Hopeless 0 0 0 0 0  PHQ - 2 Score 0 0 0 0 0  Altered sleeping 2 2 1 2 1   Tired, decreased energy 0 0 1 0 1  Change in appetite 0 0 0 0 1  Feeling bad or failure about yourself  0 0 0 1 1  Trouble concentrating 0 0 0 0 0  Moving slowly or fidgety/restless 0 1 0 0 0  Suicidal thoughts 0 0 0 0 0  PHQ-9 Score 2 3 2 3 4   Difficult doing work/chores - - - - -    ASSESSMENT AND PLAN:  Prediabetes - Plan: metFORMIN (GLUCOPHAGE) 500 MG tablet  Other depression - with emotional eating - Plan: buPROPion (WELLBUTRIN SR) 200 MG 12 hr tablet  Class 2 severe obesity with serious comorbidity and body mass index (BMI) of 37.0 to 37.9 in adult, unspecified obesity type (HCC)  PLAN:  Pre-Diabetes Katelyn Walker will continue to work on weight loss, exercise, and decreasing simple carbohydrates in her diet to help decrease the risk of diabetes. We dicussed metformin including benefits and risks. She was informed that eating too many simple carbohydrates or too many calories at one sitting increases the likelihood of GI side effects. [name] agrees to increase metformin 500 mg to twice daily with breakfast #60 with no refills and follow up with [name] as directed to monitor her progress.  Depression with Emotional  Eating Behaviors We discussed behavior modification techniques today to help Katelyn Walker deal with her emotional eating and depression. She has agreed to continue Bupropion (Wellbutrin SR) 150 mg qd and agreed to follow up as directed.  Obesity Katelyn Walker is currently in the action stage of change. As such, her goal is to continue with weight loss efforts She has agreed to keep a food journal with 1200 to 1300 calories and 80 grams of protein daily Katelyn Walker will continue her current exercise regimen and increase walking to 3 days per  week for weight loss and overall health benefits. We discussed the following Behavioral Modification Strategies today: planning for success and keep a strict food journal  Katelyn Walker has agreed to follow up with our clinic in 2 weeks. She was informed of the importance of frequent follow up visits to maximize her success with intensive lifestyle modifications for her multiple health conditions.  ALLERGIES: Allergies  Allergen Reactions  . Contrave [Naltrexone-Bupropion Hcl Er] Other (See Comments)    Vertigo/ dizziness    MEDICATIONS: Current Outpatient Medications on File Prior to Visit  Medication Sig Dispense Refill  . Cholecalciferol (VITAMIN D3) 50 MCG (2000 UT) TABS Take 2,000 Units by mouth daily. 30 tablet 0  . ipratropium (ATROVENT) 0.03 % nasal spray Place 2 sprays into both nostrils every 12 (twelve) hours. 30 mL 0  . levothyroxine (SYNTHROID, LEVOTHROID) 75 MCG tablet Take 75 mcg by mouth daily before breakfast.    . Norethindrone Acetate-Ethinyl Estrad-FE (BLISOVI 24 FE) 1-20 MG-MCG(24) tablet Take 1 tablet by mouth daily.    Marland Kitchen omeprazole (PRILOSEC) 40 MG capsule Take 40 mg by mouth daily.    . valsartan (DIOVAN) 80 MG tablet Take 80 mg by mouth daily.    Marland Kitchen zolpidem (AMBIEN) 10 MG tablet Take 10 mg by mouth at bedtime as needed for sleep.     No current facility-administered medications on file prior to visit.     PAST MEDICAL HISTORY: Past Medical History:  Diagnosis Date  . Back pain   . Dyspnea   . Elevated blood pressure reading without diagnosis of hypertension   . Esophageal ulcer   . GERD (gastroesophageal reflux disease)   . Hot flashes   . Hypothyroid   . Joint pain   . Sciatica, left side   . Vitamin B 12 deficiency   . Vitamin D deficiency     PAST SURGICAL HISTORY: Past Surgical History:  Procedure Laterality Date  . DILATION AND CURETTAGE OF UTERUS     2011   . OVARIAN CYST SURGERY     2009, 2013    SOCIAL HISTORY: Social History   Tobacco  Use  . Smoking status: Never Smoker  . Smokeless tobacco: Never Used  Substance Use Topics  . Alcohol use: Not on file  . Drug use: Not on file    FAMILY HISTORY: Family History  Problem Relation Age of Onset  . Alcoholism Mother   . Eating disorder Mother   . Diabetes Father   . Hypertension Father   . Sleep apnea Father     ROS: Review of Systems  Constitutional: Positive for weight loss.  Endo/Heme/Allergies:       Positive for polyphagia Negative for hypoglycemia  Psychiatric/Behavioral: Positive for depression. Negative for suicidal ideas.    PHYSICAL EXAM: Pt in no acute distress  RECENT LABS AND TESTS: BMET    Component Value Date/Time   NA 140 03/19/2019 1035   K 4.7 03/19/2019 1035   CL 102  03/19/2019 1035   CO2 22 03/19/2019 1035   GLUCOSE 80 03/19/2019 1035   BUN 8 03/19/2019 1035   CREATININE 0.97 03/19/2019 1035   CALCIUM 8.5 (L) 03/19/2019 1035   GFRNONAA 69 03/19/2019 1035   GFRAA 80 03/19/2019 1035   Lab Results  Component Value Date   HGBA1C 5.8 (H) 03/19/2019   HGBA1C 5.8 (H) 10/13/2018   HGBA1C 5.8 (H) 05/14/2018   Lab Results  Component Value Date   INSULIN 17.7 03/19/2019   INSULIN 17.4 10/13/2018   INSULIN 18.3 05/14/2018   CBC    Component Value Date/Time   WBC 8.7 05/14/2018 1138   WBC 13.7 (H) 09/09/2007 0546   RBC 4.96 05/14/2018 1138   RBC 3.11 (L) 09/09/2007 0546   HGB 12.0 05/14/2018 1138   HCT 38.8 05/14/2018 1138   PLT 197 09/09/2007 0546   MCV 78 (L) 05/14/2018 1138   MCH 24.2 (L) 05/14/2018 1138   MCHC 30.9 (L) 05/14/2018 1138   MCHC 33.6 09/09/2007 0546   RDW 16.7 (H) 05/14/2018 1138   LYMPHSABS 1.8 05/14/2018 1138   EOSABS 0.1 05/14/2018 1138   BASOSABS 0.0 05/14/2018 1138   Iron/TIBC/Ferritin/ %Sat No results found for: IRON, TIBC, FERRITIN, IRONPCTSAT Lipid Panel     Component Value Date/Time   CHOL 170 03/19/2019 1035   TRIG 157 (H) 03/19/2019 1035   HDL 34 (L) 03/19/2019 1035   LDLCALC 105 (H)  03/19/2019 1035   Hepatic Function Panel     Component Value Date/Time   PROT 6.5 03/19/2019 1035   ALBUMIN 3.8 03/19/2019 1035   AST 10 03/19/2019 1035   ALT 6 03/19/2019 1035   ALKPHOS 72 03/19/2019 1035   BILITOT 0.2 03/19/2019 1035      Component Value Date/Time   TSH 3.300 03/19/2019 1035   TSH 2.830 05/14/2018 1138     Ref. Range 03/19/2019 10:35  Vitamin D, 25-Hydroxy Latest Ref Range: 30.0 - 100.0 ng/mL 54.2    I, [place] CraneJoanne Murray, am acting as Energy managertranscriptionist for [place] Romonda Parker, FNP-C.  I have reviewed the above documentation for accuracy and completeness, and I agree with the above.  - Malikah Lakey, FNP-C.

## 2019-04-13 ENCOUNTER — Encounter (INDEPENDENT_AMBULATORY_CARE_PROVIDER_SITE_OTHER): Payer: Self-pay | Admitting: Family Medicine

## 2019-04-21 ENCOUNTER — Encounter (INDEPENDENT_AMBULATORY_CARE_PROVIDER_SITE_OTHER): Payer: Self-pay | Admitting: Family Medicine

## 2019-04-23 ENCOUNTER — Ambulatory Visit (INDEPENDENT_AMBULATORY_CARE_PROVIDER_SITE_OTHER): Payer: Self-pay | Admitting: Family Medicine

## 2019-04-27 ENCOUNTER — Encounter (INDEPENDENT_AMBULATORY_CARE_PROVIDER_SITE_OTHER): Payer: Self-pay | Admitting: Family Medicine

## 2019-04-27 ENCOUNTER — Ambulatory Visit (INDEPENDENT_AMBULATORY_CARE_PROVIDER_SITE_OTHER): Payer: BLUE CROSS/BLUE SHIELD | Admitting: Family Medicine

## 2019-04-27 ENCOUNTER — Other Ambulatory Visit: Payer: Self-pay

## 2019-04-27 DIAGNOSIS — R7303 Prediabetes: Secondary | ICD-10-CM

## 2019-04-27 DIAGNOSIS — Z6837 Body mass index (BMI) 37.0-37.9, adult: Secondary | ICD-10-CM

## 2019-04-27 DIAGNOSIS — F3289 Other specified depressive episodes: Secondary | ICD-10-CM | POA: Diagnosis not present

## 2019-04-28 ENCOUNTER — Encounter (INDEPENDENT_AMBULATORY_CARE_PROVIDER_SITE_OTHER): Payer: Self-pay | Admitting: Family Medicine

## 2019-04-28 MED ORDER — NALTREXONE-BUPROPION HCL ER 8-90 MG PO TB12: ORAL_TABLET | ORAL | 0 refills | Status: DC

## 2019-04-28 NOTE — Progress Notes (Addendum)
Office: [phone]  /  Fax: [phone] TeleHealth Visit:  Katelyn Walker has verbally consented to this TeleHealth visit today. The patient is located at home, the provider is located at the [facility] office. The participants in this visit include the listed provider and patient. The visit was conducted today via FaceTime.  HPI:   Chief Complaint: OBESITY Katelyn Walker is here to discuss her progress with her obesity treatment plan. She is keeping a food journal with 1200-1300 calories and 80 grams of protein daily and is following her eating plan approximately 80% of the time. She states she is walking 30 minutes 1 time per week. Katelyn Walker reports weighing 238 lbs today at home, reflecting a 1 lb weight loss. She likes to journal vs. following the Category 2 plan.  We were unable to weigh the patient today for this TeleHealth visit. She states her weight today is 238 lbs. She has lost 5 lbs since starting treatment with [name].  Pre-Diabetes Katelyn Walker has a diagnosis of prediabetes based on her elevated Hgb A1c and was informed this puts her at greater risk of developing diabetes. She reports positive mild nausea which she attributes to her new OCP. We increased her dose of metformin to BID recently. She continues to work on diet and exercise to decrease risk of diabetes. She denies polyphagia.  Depression with emotional eating behaviors Katelyn Walker is struggling with emotional eating and using food for comfort to the extent that it is negatively impacting her health. She often snacks when she is not hungry. Katelyn Walker sometimes feels she is out of control and then feels guilty that she made poor food choices. She has been working on behavior modification techniques to help reduce her emotional eating and has been somewhat successful. She shows no sign of suicidal or homicidal ideations. Katelyn Walker feels bupropion is helping with cravings. She reports being on Contrave in the past which worked  well for her.  Depression screen [facility] 2/9 02/04/2019 01/14/2019 12/24/2018 11/25/2018 10/22/2018  Decreased Interest 0 0 0 0 0  Down, Depressed, Hopeless 0 0 0 0 0  PHQ - 2 Score 0 0 0 0 0  Altered sleeping 2 2 1 2 1   Tired, decreased energy 0 0 1 0 1  Change in appetite 0 0 0 0 1  Feeling bad or failure about yourself  0 0 0 1 1  Trouble concentrating 0 0 0 0 0  Moving slowly or fidgety/restless 0 1 0 0 0  Suicidal thoughts 0 0 0 0 0  PHQ-9 Score 2 3 2 3 4   Difficult doing work/chores - - - - -   ASSESSMENT AND PLAN:  Prediabetes  Other depression - with emotional eating  Class 2 severe obesity with serious comorbidity and body mass index (BMI) of 37.0 to 37.9 in adult, unspecified obesity type (HCC) - Plan: Naltrexone-buPROPion HCl ER 8-90 MG TB12  PLAN:  Pre-Diabetes Katelyn Walker will continue to work on weight loss, exercise, and decreasing simple carbohydrates in her diet to help decrease the risk of diabetes. We dicussed metformin including benefits and risks. She was informed that eating too many simple carbohydrates or too many calories at one sitting increases the likelihood of GI side effects. Katelyn Walker will continue metformin BID and will make a note if nausea happens after metformin. Katelyn Walker agrees to follow-up with [name] as directed to monitor her progress.  Depression with Emotional Eating Behaviors We discussed behavior modification techniques today to help Katelyn Walker deal with her emotional  eating and depression. We discussed starting Contrave and she is amenable to this.  She will continue bupropion until start of Contrave and then discontinue bupropion.  Obesity Katelyn Walker is currently in the action stage of change. As such, her goal is to continue with weight loss efforts. She has agreed to keep a food journal with 1200-1300 calories and 80 grams protein daily. Katelyn Walker has been instructed to increase walking to 3 times per week for weight loss and overall health benefits. We  discussed the following Behavioral Modification Strategies today: increasing lean protein intake, better snacking choices, planning for success, and keep a strict food journal. We discussed various medication options to help Jerline with her weight loss efforts and we both agreed to begin Contrave 2 pills BID #120 with 0 refills. She will take 1 pill QAM for 1 week and then increase to 1 pill BID if no headache or nausea. She will avoid alcohol.  Katelyn Walker has agreed to follow-up with our clinic in 2 weeks. She was informed of the importance of frequent follow-up visits to maximize her success with intensive lifestyle modifications for her multiple health conditions.  ALLERGIES: No Known Allergies  MEDICATIONS: Current Outpatient Medications on File Prior to Visit  Medication Sig Dispense Refill  . Cholecalciferol (VITAMIN D3) 50 MCG (2000 UT) TABS Take 2,000 Units by mouth daily. 30 tablet 0  . ipratropium (ATROVENT) 0.03 % nasal spray Place 2 sprays into both nostrils every 12 (twelve) hours. 30 mL 0  . levothyroxine (SYNTHROID, LEVOTHROID) 75 MCG tablet Take 75 mcg by mouth daily before breakfast.    . metFORMIN (GLUCOPHAGE) 500 MG tablet Take 1 tablet (500 mg total) by mouth 2 (two) times daily with a meal. 60 tablet 0  . Norethindrone Acetate-Ethinyl Estrad-FE (BLISOVI 24 FE) 1-20 MG-MCG(24) tablet Take 1 tablet by mouth daily.    Marland Kitchen omeprazole (PRILOSEC) 40 MG capsule Take 40 mg by mouth daily.    . valsartan (DIOVAN) 80 MG tablet Take 80 mg by mouth daily.    Marland Kitchen zolpidem (AMBIEN) 10 MG tablet Take 10 mg by mouth at bedtime as needed for sleep.     No current facility-administered medications on file prior to visit.     PAST MEDICAL HISTORY: Past Medical History:  Diagnosis Date  . Back pain   . Dyspnea   . Elevated blood pressure reading without diagnosis of hypertension   . Esophageal ulcer   . GERD (gastroesophageal reflux disease)   . Hot flashes   . Hypothyroid   . Joint pain    . Sciatica, left side   . Vitamin B 12 deficiency   . Vitamin D deficiency     PAST SURGICAL HISTORY: Past Surgical History:  Procedure Laterality Date  . DILATION AND CURETTAGE OF UTERUS     2011   . OVARIAN CYST SURGERY     2009, 2013    SOCIAL HISTORY: Social History   Tobacco Use  . Smoking status: Never Smoker  . Smokeless tobacco: Never Used  Substance Use Topics  . Alcohol use: Not on file  . Drug use: Not on file    FAMILY HISTORY: Family History  Problem Relation Age of Onset  . Alcoholism Mother   . Eating disorder Mother   . Diabetes Father   . Hypertension Father   . Sleep apnea Father    ROS: Review of Systems  Gastrointestinal: Positive for nausea (mild, attributes to new OCP).  Endo/Heme/Allergies:       Negative  for polyphagia.  Psychiatric/Behavioral: Positive for depression (emotional eating). Negative for suicidal ideas.       Negative for homicidal ideas.   PHYSICAL EXAM: Pt in no acute distress  RECENT LABS AND TESTS: BMET    Component Value Date/Time   NA 140 03/19/2019 1035   K 4.7 03/19/2019 1035   CL 102 03/19/2019 1035   CO2 22 03/19/2019 1035   GLUCOSE 80 03/19/2019 1035   BUN 8 03/19/2019 1035   CREATININE 0.97 03/19/2019 1035   CALCIUM 8.5 (L) 03/19/2019 1035   GFRNONAA 69 03/19/2019 1035   GFRAA 80 03/19/2019 1035   Lab Results  Component Value Date   HGBA1C 5.8 (H) 03/19/2019   HGBA1C 5.8 (H) 10/13/2018   HGBA1C 5.8 (H) 05/14/2018   Lab Results  Component Value Date   INSULIN 17.7 03/19/2019   INSULIN 17.4 10/13/2018   INSULIN 18.3 05/14/2018   CBC    Component Value Date/Time   WBC 8.7 05/14/2018 1138   WBC 13.7 (H) 09/09/2007 0546   RBC 4.96 05/14/2018 1138   RBC 3.11 (L) 09/09/2007 0546   HGB 12.0 05/14/2018 1138   HCT 38.8 05/14/2018 1138   PLT 197 09/09/2007 0546   MCV 78 (L) 05/14/2018 1138   MCH 24.2 (L) 05/14/2018 1138   MCHC 30.9 (L) 05/14/2018 1138   MCHC 33.6 09/09/2007 0546   RDW 16.7  (H) 05/14/2018 1138   LYMPHSABS 1.8 05/14/2018 1138   EOSABS 0.1 05/14/2018 1138   BASOSABS 0.0 05/14/2018 1138   Iron/TIBC/Ferritin/ %Sat No results found for: IRON, TIBC, FERRITIN, IRONPCTSAT Lipid Panel     Component Value Date/Time   CHOL 170 03/19/2019 1035   TRIG 157 (H) 03/19/2019 1035   HDL 34 (L) 03/19/2019 1035   LDLCALC 105 (H) 03/19/2019 1035   Hepatic Function Panel     Component Value Date/Time   PROT 6.5 03/19/2019 1035   ALBUMIN 3.8 03/19/2019 1035   AST 10 03/19/2019 1035   ALT 6 03/19/2019 1035   ALKPHOS 72 03/19/2019 1035   BILITOT 0.2 03/19/2019 1035      Component Value Date/Time   TSH 3.300 03/19/2019 1035   TSH 2.830 05/14/2018 1138   Results for Gevena BarreCRIHFIELD, Ellamae G (MRN [id number]) as of 04/28/2019 10:34  Ref. Range 03/19/2019 10:35  Vitamin D, 25-Hydroxy Latest Ref Range: 30.0 - 100.0 ng/mL 54.2    I, Marianna Paymentenise Haag, am acting as Energy managertranscriptionist for AshlandDawn Lili Harts, FNP-C.  I have reviewed the above documentation for accuracy and completeness, and I agree with the above.  - Bastian Andreoli, FNP-C.

## 2019-04-28 NOTE — Telephone Encounter (Signed)
Please advise 

## 2019-04-28 NOTE — Telephone Encounter (Signed)
I added [facility] to her chart. Let me know if you want me to send in Contrave.

## 2019-05-04 DIAGNOSIS — I10 Essential (primary) hypertension: Secondary | ICD-10-CM | POA: Diagnosis not present

## 2019-05-07 ENCOUNTER — Encounter (INDEPENDENT_AMBULATORY_CARE_PROVIDER_SITE_OTHER): Payer: Self-pay | Admitting: Family Medicine

## 2019-05-12 ENCOUNTER — Encounter (INDEPENDENT_AMBULATORY_CARE_PROVIDER_SITE_OTHER): Payer: Self-pay | Admitting: Family Medicine

## 2019-05-12 ENCOUNTER — Ambulatory Visit (INDEPENDENT_AMBULATORY_CARE_PROVIDER_SITE_OTHER): Payer: BLUE CROSS/BLUE SHIELD | Admitting: Family Medicine

## 2019-05-12 ENCOUNTER — Other Ambulatory Visit: Payer: Self-pay

## 2019-05-12 ENCOUNTER — Other Ambulatory Visit (INDEPENDENT_AMBULATORY_CARE_PROVIDER_SITE_OTHER): Payer: Self-pay | Admitting: Family Medicine

## 2019-05-12 DIAGNOSIS — Z6837 Body mass index (BMI) 37.0-37.9, adult: Secondary | ICD-10-CM

## 2019-05-12 DIAGNOSIS — R7303 Prediabetes: Secondary | ICD-10-CM

## 2019-05-12 DIAGNOSIS — I10 Essential (primary) hypertension: Secondary | ICD-10-CM | POA: Diagnosis not present

## 2019-05-12 MED ORDER — METFORMIN HCL 500 MG PO TABS: 500.0000 mg | ORAL_TABLET | Freq: Two times a day (BID) | ORAL | 0 refills | Status: DC

## 2019-05-13 ENCOUNTER — Encounter (INDEPENDENT_AMBULATORY_CARE_PROVIDER_SITE_OTHER): Payer: Self-pay | Admitting: Family Medicine

## 2019-05-13 DIAGNOSIS — I10 Essential (primary) hypertension: Secondary | ICD-10-CM | POA: Insufficient documentation

## 2019-05-13 NOTE — Progress Notes (Signed)
Office: [phone]  /  Fax: [phone] TeleHealth Visit:  Katelyn Walker has verbally consented to this TeleHealth visit today. The patient is located at home, the provider is located at the [facility] office. The participants in this visit include the listed provider and patient. The visit was conducted today via FaceTime.  HPI:   Chief Complaint: OBESITY [name] is here to discuss her progress with her obesity treatment plan. She is keeping a food journal with 1200-1300 calories and 80 grams of protein daily and is following her eating plan approximately 75% of the time. She states sheis walking 30 minutes 1 time per week. Katelyn Walker states she has gained 1 lb since her last virtual visit and is 239 lbs today. She was started on Contrave at her last visit and is up to 1 pill BID. She reports having had dry mouth and nausea. She is unsure if it is helping with her appetite.  We were unable to weigh the patient today for this TeleHealth visit. She states her weight today is 239 lbs. She has lost 5 lbs since starting treatment with [name].  Pre-Diabetes Katelyn Walker has a diagnosis of prediabetes based on her elevated Hgb A1c and was informed this puts her at greater risk of developing diabetes. She is taking metformin 500 mg BID currently and continues to work on diet and exercise to decrease risk of diabetes. She reports polyphagia.  Hypertension Katelyn Walker is a 48 y.o. female with hypertension which is well controlled on valsartan. Katelyn Walker denies chest pain or shortness of breath on exertion. She is working weight loss to help control her blood pressure with the goal of decreasing her risk of heart attack and stroke. Katelyn Walker reports her blood pressure was 133/86 at home on 05/04/2019.  ASSESSMENT AND PLAN:  Prediabetes - Plan: metFORMIN (GLUCOPHAGE) 500 MG tablet  Essential hypertension  Class 2 severe obesity with serious comorbidity and body mass index  (BMI) of 37.0 to 37.9 in adult, unspecified obesity type [facility])  PLAN:  Pre-Diabetes [name] will continue to work on weight loss, exercise, and decreasing simple carbohydrates in her diet to help decrease the risk of diabetes. We dicussed metformin including benefits and risks. She was informed that eating too many simple carbohydrates or too many calories at one sitting increases the likelihood of GI side effects. Katelyn Walker was given a refill on her metformin 500 mg BID with meals #60 with 0 refills and will follow-up with our clinic in 2-3 weeks.  Hypertension We discussed sodium restriction, working on healthy weight loss, and a regular exercise program as the means to achieve improved blood pressure control. [name] agreed with this plan and agreed to follow up as directed. We will continue to monitor her blood pressure as well as her progress with the above lifestyle modifications. She will continue Diovan (no prescription needed) and will watch for signs of hypotension as she continues her lifestyle modifications.  Obesity Katelyn Walker is currently in the action stage of change. As such, her goal is to continue with weight loss efforts. She has agreed to keep a food journal with 1200-1300 calories and 80 grams of protein daily. Katelyn Walker has been instructed to increase her frequency of walking to 2-3 times per week for weight loss and overall health benefits. We discussed the following Behavioral Modification Strategies today: work on meal planning, easy cooking plans, and planning for success. Marigene will continue Contrave 1 pill BID.  Katelyn Walker has agreed to follow-up with  our clinic in 2-3 weeks. She was informed of the importance of frequent follow-up visits to maximize her success with intensive lifestyle modifications for her multiple health conditions.  ALLERGIES: No Known Allergies  MEDICATIONS: Current Outpatient Medications on File Prior to Visit  Medication Sig Dispense Refill  .  Cholecalciferol (VITAMIN D3) 50 MCG (2000 UT) TABS Take 2,000 Units by mouth daily. 30 tablet 0  . ipratropium (ATROVENT) 0.03 % nasal spray Place 2 sprays into both nostrils every 12 (twelve) hours. 30 mL 0  . levothyroxine (SYNTHROID, LEVOTHROID) 75 MCG tablet Take 75 mcg by mouth daily before breakfast.    . Naltrexone-buPROPion HCl ER 8-90 MG TB12 Take two tabs twice daily 120 tablet 0  . Norethindrone Acetate-Ethinyl Estrad-FE (BLISOVI 24 FE) 1-20 MG-MCG(24) tablet Take 1 tablet by mouth daily.    Marland Kitchen. omeprazole (PRILOSEC) 40 MG capsule Take 40 mg by mouth daily.    . valsartan (DIOVAN) 80 MG tablet Take 80 mg by mouth daily.    Marland Kitchen. zolpidem (AMBIEN) 10 MG tablet Take 10 mg by mouth at bedtime as needed for sleep.     No current facility-administered medications on file prior to visit.     PAST MEDICAL HISTORY: Past Medical History:  Diagnosis Date  . Back pain   . Dyspnea   . Elevated blood pressure reading without diagnosis of hypertension   . Esophageal ulcer   . GERD (gastroesophageal reflux disease)   . Hot flashes   . Hypothyroid   . Joint pain   . Sciatica, left side   . Vitamin B 12 deficiency   . Vitamin D deficiency     PAST SURGICAL HISTORY: Past Surgical History:  Procedure Laterality Date  . DILATION AND CURETTAGE OF UTERUS     2011   . OVARIAN CYST SURGERY     2009, 2013    SOCIAL HISTORY: Social History   Tobacco Use  . Smoking status: Never Smoker  . Smokeless tobacco: Never Used  Substance Use Topics  . Alcohol use: Not on file  . Drug use: Not on file    FAMILY HISTORY: Family History  Problem Relation Age of Onset  . Alcoholism Mother   . Eating disorder Mother   . Diabetes Father   . Hypertension Father   . Sleep apnea Father    ROS: Review of Systems  Respiratory: Negative for shortness of breath.   Cardiovascular: Negative for chest pain.  Endo/Heme/Allergies:       Positive for polyphagia.   PHYSICAL EXAM: Pt in no acute  distress  RECENT LABS AND TESTS: BMET    Component Value Date/Time   NA 140 03/19/2019 1035   K 4.7 03/19/2019 1035   CL 102 03/19/2019 1035   CO2 22 03/19/2019 1035   GLUCOSE 80 03/19/2019 1035   BUN 8 03/19/2019 1035   CREATININE 0.97 03/19/2019 1035   CALCIUM 8.5 (L) 03/19/2019 1035   GFRNONAA 69 03/19/2019 1035   GFRAA 80 03/19/2019 1035   Lab Results  Component Value Date   HGBA1C 5.8 (H) 03/19/2019   HGBA1C 5.8 (H) 10/13/2018   HGBA1C 5.8 (H) 05/14/2018   Lab Results  Component Value Date   INSULIN 17.7 03/19/2019   INSULIN 17.4 10/13/2018   INSULIN 18.3 05/14/2018   CBC    Component Value Date/Time   WBC 8.7 05/14/2018 1138   WBC 13.7 (H) 09/09/2007 0546   RBC 4.96 05/14/2018 1138   RBC 3.11 (L) 09/09/2007 0546   HGB  12.0 05/14/2018 1138   HCT 38.8 05/14/2018 1138   PLT 197 09/09/2007 0546   MCV 78 (L) 05/14/2018 1138   MCH 24.2 (L) 05/14/2018 1138   MCHC 30.9 (L) 05/14/2018 1138   MCHC 33.6 09/09/2007 0546   RDW 16.7 (H) 05/14/2018 1138   LYMPHSABS 1.8 05/14/2018 1138   EOSABS 0.1 05/14/2018 1138   BASOSABS 0.0 05/14/2018 1138   Iron/TIBC/Ferritin/ %Sat No results found for: IRON, TIBC, FERRITIN, IRONPCTSAT Lipid Panel     Component Value Date/Time   CHOL 170 03/19/2019 1035   TRIG 157 (H) 03/19/2019 1035   HDL 34 (L) 03/19/2019 1035   LDLCALC 105 (H) 03/19/2019 1035   Hepatic Function Panel     Component Value Date/Time   PROT 6.5 03/19/2019 1035   ALBUMIN 3.8 03/19/2019 1035   AST 10 03/19/2019 1035   ALT 6 03/19/2019 1035   ALKPHOS 72 03/19/2019 1035   BILITOT 0.2 03/19/2019 1035      Component Value Date/Time   TSH 3.300 03/19/2019 1035   TSH 2.830 05/14/2018 1138   Results for ANAYRA, STALLING (MRN [id number]) as of 05/13/2019 11:56  Ref. Range 03/19/2019 10:35  Vitamin D, 25-Hydroxy Latest Ref Range: 30.0 - 100.0 ng/mL 54.2    I, Marianna Payment, am acting as Energy manager for [place], FNP-C.  I have reviewed the above  documentation for accuracy and completeness, and I agree with the above.  - Hubert Raatz, FNP-C.

## 2019-05-26 ENCOUNTER — Encounter (INDEPENDENT_AMBULATORY_CARE_PROVIDER_SITE_OTHER): Payer: Self-pay | Admitting: Family Medicine

## 2019-05-27 ENCOUNTER — Encounter (INDEPENDENT_AMBULATORY_CARE_PROVIDER_SITE_OTHER): Payer: Self-pay | Admitting: Family Medicine

## 2019-05-27 NOTE — Telephone Encounter (Signed)
Please advise 

## 2019-05-28 ENCOUNTER — Other Ambulatory Visit: Payer: Self-pay

## 2019-05-28 ENCOUNTER — Encounter (INDEPENDENT_AMBULATORY_CARE_PROVIDER_SITE_OTHER): Payer: Self-pay | Admitting: Family Medicine

## 2019-05-28 ENCOUNTER — Ambulatory Visit (INDEPENDENT_AMBULATORY_CARE_PROVIDER_SITE_OTHER): Payer: BC Managed Care – PPO | Admitting: Family Medicine

## 2019-05-28 DIAGNOSIS — R7303 Prediabetes: Secondary | ICD-10-CM | POA: Diagnosis not present

## 2019-05-28 DIAGNOSIS — Z6837 Body mass index (BMI) 37.0-37.9, adult: Secondary | ICD-10-CM | POA: Diagnosis not present

## 2019-06-01 ENCOUNTER — Encounter (INDEPENDENT_AMBULATORY_CARE_PROVIDER_SITE_OTHER): Payer: Self-pay | Admitting: Family Medicine

## 2019-06-01 NOTE — Progress Notes (Signed)
Office: [phone]  /  Fax: [phone] TeleHealth Visit:  Katelyn BarreSusanne G Walker has verbally consented to this TeleHealth visit today. The patient is located at home, the provider is located at the [facility] office. The participants in this visit include the listed provider and patient. The visit was conducted today via FaceTime.  HPI:   Chief Complaint: OBESITY Katelyn Walker is here to discuss her progress with her obesity treatment plan. She is keeping a food journal with 1200 calories and 80 grams of protein and is following her eating plan approximately 80% of the time. She states she is exercising 0 minutes 0 times per week. Katelyn Walker states her weight today is 238 lbs, reflecting a weight loss of 1 lb. She had vertigo while taking Contrave (1 pill BID) and is unsure if vertigo was caused by Contrave. She stopped the Contrave and her vertigo has subsided. She reports having instances of vertigo in the past. Contrave did help suppress her appetite/cravings.  We were unable to weigh the patient today for this TeleHealth visit. She feels as if she has lost 1 lb since her last visit. She has lost 5 lbs since starting treatment with Katelyn Walker.  Pre-Diabetes Katelyn Walker has a diagnosis of prediabetes based on her elevated Hgb A1c and was informed this puts her at greater risk of developing diabetes. She is taking metformin BID currently and continues to work on diet and exercise to decrease risk of diabetes. She denies polyphagia. Lab Results  Component Value Date   HGBA1C 5.8 (H) 03/19/2019    ASSESSMENT AND PLAN:  Prediabetes  Class 2 severe obesity with serious comorbidity and body mass index (BMI) of 37.0 to 37.9 in adult, unspecified obesity type Katelyn Walker(HCC)  PLAN:  Pre-Diabetes Katelyn Walker will continue to work on weight loss, exercise, and decreasing simple carbohydrates in her diet to help decrease the risk of diabetes. Katelyn Walker will continue metformin and follow-up with Katelyn Walker as directed to  monitor her progress.  I spent > than 50% of the 15 minute visit on counseling as documented in the note.  Obesity Katelyn Walker is currently in the action stage of change. As such, her goal is to continue with weight loss efforts. She has agreed to keep a food journal with 1200-1300 calories and 80 grams of  protein daily. Katelyn Walker has been instructed to resume swimming if possible 2 days a week for weight loss and overall health benefits. We discussed the following Behavioral Modification Strategies today: increasing lean protein intake and planning for success. Katelyn Walker will try Contrave 1 pill in a.m. and again in a few days. She will stop Contrave if vertigo occurs.  Katelyn Walker has agreed to follow-up with our clinic in 2 weeks. She was informed of the importance of frequent follow-up visits to maximize her success with intensive lifestyle modifications for her multiple health conditions.  ALLERGIES: No Known Allergies  MEDICATIONS: Current Outpatient Medications on File Prior to Visit  Medication Sig Dispense Refill  . Cholecalciferol (VITAMIN D3) 50 MCG (2000 UT) TABS Take 2,000 Units by mouth daily. 30 tablet 0  . ipratropium (ATROVENT) 0.03 % nasal spray Place 2 sprays into both nostrils every 12 (twelve) hours. 30 mL 0  . levothyroxine (SYNTHROID, LEVOTHROID) 75 MCG tablet Take 75 mcg by mouth daily before breakfast.    . metFORMIN (GLUCOPHAGE) 500 MG tablet Take 1 tablet (500 mg total) by mouth 2 (two) times daily with a meal. 60 tablet 0  . Naltrexone-buPROPion HCl ER 8-90 MG TB12 Take  two tabs twice daily 120 tablet 0  . Norethindrone Acetate-Ethinyl Estrad-FE (BLISOVI 24 FE) 1-20 MG-MCG(24) tablet Take 1 tablet by mouth daily.    Marland Kitchen. omeprazole (PRILOSEC) 40 MG capsule Take 40 mg by mouth daily.    . valsartan (DIOVAN) 80 MG tablet Take 80 mg by mouth daily.    Marland Kitchen. zolpidem (AMBIEN) 10 MG tablet Take 10 mg by mouth at bedtime as needed for sleep.     No current facility-administered  medications on file prior to visit.     PAST MEDICAL HISTORY: Past Medical History:  Diagnosis Date  . Back pain   . Dyspnea   . Elevated blood pressure reading without diagnosis of hypertension   . Esophageal ulcer   . GERD (gastroesophageal reflux disease)   . Hot flashes   . Hypothyroid   . Joint pain   . Sciatica, left side   . Vitamin B 12 deficiency   . Vitamin D deficiency     PAST SURGICAL HISTORY: Past Surgical History:  Procedure Laterality Date  . DILATION AND CURETTAGE OF UTERUS     2011   . OVARIAN CYST SURGERY     2009, 2013    SOCIAL HISTORY: Social History   Tobacco Use  . Smoking status: Never Smoker  . Smokeless tobacco: Never Used  Substance Use Topics  . Alcohol use: Not on file  . Drug use: Not on file    FAMILY HISTORY: Family History  Problem Relation Age of Onset  . Alcoholism Mother   . Eating disorder Mother   . Diabetes Father   . Hypertension Father   . Sleep apnea Father    ROS: Review of Systems  Endo/Heme/Allergies:       Negative for polyphagia.   PHYSICAL EXAM: Pt in no acute distress  RECENT LABS AND TESTS: BMET    Component Value Date/Time   NA 140 03/19/2019 1035   K 4.7 03/19/2019 1035   CL 102 03/19/2019 1035   CO2 22 03/19/2019 1035   GLUCOSE 80 03/19/2019 1035   BUN 8 03/19/2019 1035   CREATININE 0.97 03/19/2019 1035   CALCIUM 8.5 (L) 03/19/2019 1035   GFRNONAA 69 03/19/2019 1035   GFRAA 80 03/19/2019 1035   Lab Results  Component Value Date   HGBA1C 5.8 (H) 03/19/2019   HGBA1C 5.8 (H) 10/13/2018   HGBA1C 5.8 (H) 05/14/2018   Lab Results  Component Value Date   INSULIN 17.7 03/19/2019   INSULIN 17.4 10/13/2018   INSULIN 18.3 05/14/2018   CBC    Component Value Date/Time   WBC 8.7 05/14/2018 1138   WBC 13.7 (H) 09/09/2007 0546   RBC 4.96 05/14/2018 1138   RBC 3.11 (L) 09/09/2007 0546   HGB 12.0 05/14/2018 1138   HCT 38.8 05/14/2018 1138   PLT 197 09/09/2007 0546   MCV 78 (L) 05/14/2018  1138   MCH 24.2 (L) 05/14/2018 1138   MCHC 30.9 (L) 05/14/2018 1138   MCHC 33.6 09/09/2007 0546   RDW 16.7 (H) 05/14/2018 1138   LYMPHSABS 1.8 05/14/2018 1138   EOSABS 0.1 05/14/2018 1138   BASOSABS 0.0 05/14/2018 1138   Iron/TIBC/Ferritin/ %Sat No results found for: IRON, TIBC, FERRITIN, IRONPCTSAT Lipid Panel     Component Value Date/Time   CHOL 170 03/19/2019 1035   TRIG 157 (H) 03/19/2019 1035   HDL 34 (L) 03/19/2019 1035   LDLCALC 105 (H) 03/19/2019 1035   Hepatic Function Panel     Component Value Date/Time  PROT 6.5 03/19/2019 1035   ALBUMIN 3.8 03/19/2019 1035   AST 10 03/19/2019 1035   ALT 6 03/19/2019 1035   ALKPHOS 72 03/19/2019 1035   BILITOT 0.2 03/19/2019 1035      Component Value Date/Time   TSH 3.300 03/19/2019 1035   TSH 2.830 05/14/2018 1138   Results for JORDAN, CARAVEO (MRN [id number]) as of 06/01/2019 12:40  Ref. Range 03/19/2019 10:35  Vitamin D, 25-Hydroxy Latest Ref Range: 30.0 - 100.0 ng/mL 54.2    I, Michaelene Song, am acting as Location manager for [employer], FNP  I have reviewed the above documentation for accuracy and completeness, and I agree with the above.  - Trestan Vahle, FNP-C.

## 2019-06-11 ENCOUNTER — Other Ambulatory Visit: Payer: Self-pay

## 2019-06-11 ENCOUNTER — Ambulatory Visit (INDEPENDENT_AMBULATORY_CARE_PROVIDER_SITE_OTHER): Payer: BC Managed Care – PPO | Admitting: Family Medicine

## 2019-06-11 ENCOUNTER — Encounter (INDEPENDENT_AMBULATORY_CARE_PROVIDER_SITE_OTHER): Payer: Self-pay | Admitting: Family Medicine

## 2019-06-11 ENCOUNTER — Other Ambulatory Visit (INDEPENDENT_AMBULATORY_CARE_PROVIDER_SITE_OTHER): Payer: Self-pay | Admitting: Family Medicine

## 2019-06-11 VITALS — BP 131/80 | HR 80 | Temp 98.2°F | Ht 67.0 in | Wt 240.0 lb

## 2019-06-11 DIAGNOSIS — R7303 Prediabetes: Secondary | ICD-10-CM

## 2019-06-11 DIAGNOSIS — E559 Vitamin D deficiency, unspecified: Secondary | ICD-10-CM | POA: Diagnosis not present

## 2019-06-11 DIAGNOSIS — Z9189 Other specified personal risk factors, not elsewhere classified: Secondary | ICD-10-CM

## 2019-06-11 DIAGNOSIS — Z6837 Body mass index (BMI) 37.0-37.9, adult: Secondary | ICD-10-CM

## 2019-06-11 MED ORDER — METFORMIN HCL 500 MG PO TABS: 500.0000 mg | ORAL_TABLET | Freq: Two times a day (BID) | ORAL | 0 refills | Status: DC

## 2019-06-11 NOTE — Progress Notes (Signed)
Office: [phone]  /  Fax: [phone]   HPI:   Chief Complaint: OBESITY Katelyn Walker is here to discuss her progress with her obesity treatment plan. She is keeping a food journal with 1200 calories and 80 grams of protein and is following her eating plan approximately 75% of the time. She states she is swimming 30 minutes 2 times per week. Katelyn Walker states her husband lost his job last week and she has been off track. She has started back swimming for exercise. She is also back at work and not journaling well. She is on Contrave 1 pill in a.m., which is no longer helping with polyphagia. She has an episode of vertigo recently but has since decided that it was not related to the Contrave. Her weight is 240 lb (108.9 kg) today and has had a weight gain of 3 lbs since her last in office visit. She has lost 2 lbs since starting treatment with [name].  Pre-Diabetes Katelyn Walker has a diagnosis of prediabetes based on her elevated Hgb A1c and was informed this puts her at greater risk of developing diabetes. She reports hunger is controlled mostly and she denies hypoglycemia. She is taking metformin currently and continues to work on diet and exercise to decrease risk of diabetes. She does report some cravings. Lab Results  Component Value Date   HGBA1C 5.8 (H) 03/19/2019    At risk for diabetes Katelyn Walker is at higher than averagerisk for developing diabetes due to her obesity. She currently denies polyuria or polydipsia.  Vitamin D deficiency Katelyn Walker has a diagnosis of Vitamin D deficiency, which is at goal. She is currently taking OTC Vit D 2,000 IU daily and denies nausea, vomiting or muscle weakness.  ASSESSMENT AND PLAN:  Prediabetes - Plan: metFORMIN (GLUCOPHAGE) 500 MG tablet, EKG 12-Lead  Vitamin D deficiency  At risk for diabetes mellitus  Class 2 severe obesity with serious comorbidity and body mass index (BMI) of 37.0 to 37.9 in adult, unspecified obesity type [facility])  PLAN:   Pre-Diabetes Katelyn Walker will continue to work on weight loss, exercise, and decreasing simple carbohydrates in her diet to help decrease the risk of diabetes.  Katelyn Walker was given a refill on her metformin 500 mg BID #60 with 0 refills. She agrees to follow-up with our clinic in 3 weeks.  Diabetes risk counseling Katelyn Walker was given extended (15 minutes) diabetes prevention counseling today. She is 48 y.o. female and has risk factors for diabetes including obesity. We discussed intensive lifestyle modifications today with an emphasis on weight loss as well as increasing exercise and decreasing simple carbohydrates in her diet.  Vitamin D Deficiency Katelyn Walker was informed that low Vitamin D levels contributes to fatigue and are associated with obesity, breast, and colon cancer. She agrees to continue taking OTC Vit D and will follow-up for routine testing of Vitamin D, at least 2-3 times per year. She was informed of the risk of over-replacement of Vitamin D and agrees to not increase her dose unless she discusses this with [name] first. Katelyn Walker agrees to follow-up with our clinic in 3 weeks.  Obesity Katelyn Walker is currently in the action stage of change. As such, her goal is to continue with weight loss efforts. She has agreed to follow the Category 2 plan or journal 1200 calories + 80 grams of protein. Katelyn Walker will follow Category 2 Monday through Friday and will journal on the weekends. Katelyn Walker has been instructed to continue her current exercise regimen for weight loss and overall health benefits. We  discussed the following Behavioral Modification Stratagies today: increasing lean protein intake, decreasing simple carbohydrates, and planning for success.  Katelyn Walker will increase her Contrave to pill BID.  Katelyn Walker has agreed to follow-up with our clinic in 3 weeks. She was informed of the importance of frequent follow-up visits to maximize her success with intensive lifestyle modifications for her multiple health  conditions.  ALLERGIES: No Known Allergies  MEDICATIONS: Current Outpatient Medications on File Prior to Visit  Medication Sig Dispense Refill  . Cholecalciferol (VITAMIN D3) 50 MCG (2000 UT) TABS Take 2,000 Units by mouth daily. 30 tablet 0  . ipratropium (ATROVENT) 0.03 % nasal spray Place 2 sprays into both nostrils every 12 (twelve) hours. 30 mL 0  . levothyroxine (SYNTHROID, LEVOTHROID) 75 MCG tablet Take 75 mcg by mouth daily before breakfast.    . Naltrexone-buPROPion HCl ER 8-90 MG TB12 Take two tabs twice daily 120 tablet 0  . Norethindrone Acetate-Ethinyl Estrad-FE (BLISOVI 24 FE) 1-20 MG-MCG(24) tablet Take 1 tablet by mouth daily.    Marland Kitchen. omeprazole (PRILOSEC) 40 MG capsule Take 40 mg by mouth daily.    . valsartan (DIOVAN) 80 MG tablet Take 80 mg by mouth daily.    Marland Kitchen. zolpidem (AMBIEN) 10 MG tablet Take 10 mg by mouth at bedtime as needed for sleep.     No current facility-administered medications on file prior to visit.     PAST MEDICAL HISTORY: Past Medical History:  Diagnosis Date  . Back pain   . Dyspnea   . Elevated blood pressure reading without diagnosis of hypertension   . Esophageal ulcer   . GERD (gastroesophageal reflux disease)   . Hot flashes   . Hypothyroid   . Joint pain   . Sciatica, left side   . Vitamin B 12 deficiency   . Vitamin D deficiency     PAST SURGICAL HISTORY: Past Surgical History:  Procedure Laterality Date  . DILATION AND CURETTAGE OF UTERUS     2011   . OVARIAN CYST SURGERY     2009, 2013    SOCIAL HISTORY: Social History   Tobacco Use  . Smoking status: Never Smoker  . Smokeless tobacco: Never Used  Substance Use Topics  . Alcohol use: Not on file  . Drug use: Not on file    FAMILY HISTORY: Family History  Problem Relation Age of Onset  . Alcoholism Mother   . Eating disorder Mother   . Diabetes Father   . Hypertension Father   . Sleep apnea Father    ROS: Review of Systems  Gastrointestinal: Negative for  nausea and vomiting.  Musculoskeletal:       Negative for muscle weakness.  Endo/Heme/Allergies:       Negative for hypoglycemia.   PHYSICAL EXAM: Blood pressure 131/80, pulse 80, temperature 98.2 F (36.8 C), temperature source Oral, height 5\' 7"  (1.702 m), weight 240 lb (108.9 kg), SpO2 99 %. Body mass index is 37.59 kg/m. Physical Exam Vitals signs reviewed.  Constitutional:      Appearance: Normal appearance. She is obese.  Cardiovascular:     Rate and Rhythm: Normal rate.     Pulses: Normal pulses.  Pulmonary:     Effort: Pulmonary effort is normal.     Breath sounds: Normal breath sounds.  Musculoskeletal: Normal range of motion.  Skin:    General: Skin is warm and dry.  Neurological:     Mental Status: She is alert and oriented to person, place, and time.  Psychiatric:  Behavior: Behavior normal.   RECENT LABS AND TESTS: BMET    Component Value Date/Time   NA 140 03/19/2019 1035   K 4.7 03/19/2019 1035   CL 102 03/19/2019 1035   CO2 22 03/19/2019 1035   GLUCOSE 80 03/19/2019 1035   BUN 8 03/19/2019 1035   CREATININE 0.97 03/19/2019 1035   CALCIUM 8.5 (L) 03/19/2019 1035   GFRNONAA 69 03/19/2019 1035   GFRAA 80 03/19/2019 1035   Lab Results  Component Value Date   HGBA1C 5.8 (H) 03/19/2019   HGBA1C 5.8 (H) 10/13/2018   HGBA1C 5.8 (H) 05/14/2018   Lab Results  Component Value Date   INSULIN 17.7 03/19/2019   INSULIN 17.4 10/13/2018   INSULIN 18.3 05/14/2018   CBC    Component Value Date/Time   WBC 8.7 05/14/2018 1138   WBC 13.7 (H) 09/09/2007 0546   RBC 4.96 05/14/2018 1138   RBC 3.11 (L) 09/09/2007 0546   HGB 12.0 05/14/2018 1138   HCT 38.8 05/14/2018 1138   PLT 197 09/09/2007 0546   MCV 78 (L) 05/14/2018 1138   MCH 24.2 (L) 05/14/2018 1138   MCHC 30.9 (L) 05/14/2018 1138   MCHC 33.6 09/09/2007 0546   RDW 16.7 (H) 05/14/2018 1138   LYMPHSABS 1.8 05/14/2018 1138   EOSABS 0.1 05/14/2018 1138   BASOSABS 0.0 05/14/2018 1138    Iron/TIBC/Ferritin/ %Sat No results found for: IRON, TIBC, FERRITIN, IRONPCTSAT Lipid Panel     Component Value Date/Time   CHOL 170 03/19/2019 1035   TRIG 157 (H) 03/19/2019 1035   HDL 34 (L) 03/19/2019 1035   LDLCALC 105 (H) 03/19/2019 1035   Hepatic Function Panel     Component Value Date/Time   PROT 6.5 03/19/2019 1035   ALBUMIN 3.8 03/19/2019 1035   AST 10 03/19/2019 1035   ALT 6 03/19/2019 1035   ALKPHOS 72 03/19/2019 1035   BILITOT 0.2 03/19/2019 1035      Component Value Date/Time   TSH 3.300 03/19/2019 1035   TSH 2.830 05/14/2018 1138   Results for Gevena BarreCRIHFIELD, Katelyn Walker (MRN [id number]) as of 06/11/2019 15:13  Ref. Range 03/19/2019 10:35  Vitamin D, 25-Hydroxy Latest Ref Range: 30.0 - 100.0 ng/mL 54.2   OBESITY BEHAVIORAL INTERVENTION VISIT  Today's visit was #20  Starting weight: 242 lbs Starting date: 05/14/2018 Today's weight: 240 lbs  Today's date: 06/11/2019 Total lbs lost to date: 2  ASK: We discussed the diagnosis of obesity with Gevena BarreSusanne Walker Colgan today and Katelyn Walker agreed to give us permission to discuss obesity behavioral modification therapy today.  ASSESS: Katelyn Walker has the diagnosis of obesity and her BMI today is 37.6. Katelyn Walker is in the action stage of change.   ADVISE: Katelyn Walker was educated on the multiple health risks of obesity as well as the benefit of weight loss to improve her health. She was advised of the need for long term treatment and the importance of lifestyle modifications to improve her current health and to decrease her risk of future health problems.  AGREE: Multiple dietary modification options and treatment options were discussed and  Katelyn Walker agreed to follow the recommendations documented in the above note.  ARRANGE: Katelyn Walker was educated on the importance of frequent visits to treat obesity as outlined per CMS and USPSTF guidelines and agreed to schedule her next follow up appointment today.  IMarianna Payment, Denise Haag, am acting as  Energy managertranscriptionist for [place]wn Rickard [place], FNP-C.  I have reviewed the above documentation for accuracy and completeness, and I agree with the above.  -  [employer], FNP-C.

## 2019-06-12 ENCOUNTER — Encounter (INDEPENDENT_AMBULATORY_CARE_PROVIDER_SITE_OTHER): Payer: Self-pay | Admitting: Family Medicine

## 2019-06-18 ENCOUNTER — Encounter (INDEPENDENT_AMBULATORY_CARE_PROVIDER_SITE_OTHER): Payer: Self-pay | Admitting: Family Medicine

## 2019-06-26 ENCOUNTER — Encounter (INDEPENDENT_AMBULATORY_CARE_PROVIDER_SITE_OTHER): Payer: Self-pay | Admitting: Family Medicine

## 2019-06-29 ENCOUNTER — Ambulatory Visit (INDEPENDENT_AMBULATORY_CARE_PROVIDER_SITE_OTHER): Payer: BC Managed Care – PPO | Admitting: Family Medicine

## 2019-07-01 ENCOUNTER — Other Ambulatory Visit: Payer: Self-pay

## 2019-07-01 ENCOUNTER — Encounter (INDEPENDENT_AMBULATORY_CARE_PROVIDER_SITE_OTHER): Payer: Self-pay | Admitting: Family Medicine

## 2019-07-01 ENCOUNTER — Telehealth (INDEPENDENT_AMBULATORY_CARE_PROVIDER_SITE_OTHER): Payer: Self-pay | Admitting: Family Medicine

## 2019-07-01 DIAGNOSIS — R7303 Prediabetes: Secondary | ICD-10-CM

## 2019-07-01 DIAGNOSIS — Z6837 Body mass index (BMI) 37.0-37.9, adult: Secondary | ICD-10-CM

## 2019-07-01 DIAGNOSIS — G4709 Other insomnia: Secondary | ICD-10-CM

## 2019-07-01 MED ORDER — NALTREXONE-BUPROPION HCL ER 8-90 MG PO TB12: ORAL_TABLET | ORAL | 0 refills | Status: DC

## 2019-07-01 MED ORDER — METFORMIN HCL 500 MG PO TABS: 500.0000 mg | ORAL_TABLET | Freq: Two times a day (BID) | ORAL | 0 refills | Status: DC

## 2019-07-02 NOTE — Progress Notes (Signed)
Office: [phone]  /  Fax: [phone] TeleHealth Visit:  Katelyn Walker has verbally consented to this TeleHealth visit today. The patient is located at work, the provider is located at the [employer] [facility] office. The participants in this visit include the listed provider and patient. The visit was conducted today via FaceTime.  HPI:   Chief Complaint: OBESITY Katelyn Walker is here to discuss her progress with her obesity treatment plan. She is keeping a food journal with 1200 calories and 80 grams of protein and is following her eating plan approximately 50% of the time. She states she is swimming 30 minutes 2 times per week. [name] feels she has gained weight since her last visit and her weight is 241 lbs at home. She notes increased stress eating since her spouse lost his job. She notes some nausea and insomnia after adding her PM dose of Contrave. We were unable to weigh the patient today for this TeleHealth visit. She feels as if she has gained weight since her last visit. She has lost 2 lbs since starting treatment with [name].  Pre-Diabetes Katelyn Walker has a diagnosis of prediabetes based on her elevated Hgb A1c and was informed this puts her at greater risk of developing diabetes. She is stable on metformin. She has struggled with her eating plan more in there last 2 weeks after doing back to work and has increased her simple carbs.   Insomnia Katelyn Walker notes increased insomnia after increasing Contrave, wakes frequently and has a hard time falling asleep. She is trying to avoid taking Ambien as she feels she had started to build up a tolerance to it.  ASSESSMENT AND PLAN:  Other insomnia  Prediabetes - Plan: metFORMIN (GLUCOPHAGE) 500 MG tablet  Class 2 severe obesity with serious comorbidity and body mass index (BMI) of 37.0 to 37.9 in adult, unspecified obesity type ([place]) - Plan: Naltrexone-buPROPion HCl ER 8-90 MG TB12  PLAN:  Pre-Diabetes Katelyn Walker will continue to  work on weight loss, exercise, and decreasing simple carbohydrates in her diet to help decrease the risk of diabetes. We dicussed metformin including benefits and risks. She was informed that eating too many simple carbohydrates or too many calories at one sitting increases the likelihood of GI side effects. Katelyn Walker was given a refill on her metformin 500 mg #60 with 0 refills and she agrees to follow-up with our clinic in 2 weeks. She was instructed to get back to her eating plan more strictly.  Insomnia The problem of recurrent insomnia was discussed. Avoidance of caffeine sources was strongly encouraged and sleep hygiene issues were reviewed. Katelyn Walker was instructed to discontinue her PM dose of  Contrave and will follow-up with as directed to monitor her progress.   Obesity Katelyn Walker is currently in the action stage of change. As such, her goal is to continue with weight loss efforts. She has agreed to keep a food journal with 1200 calories and 80 grams of protein.  Katelyn Walker has been instructed to work up to a goal of 150 minutes of combined cardio and strengthening exercise per week for weight loss and overall health benefits. We discussed the following Behavioral Modification Strategies today: increasing lean protein intake, decreasing simple carbohydrates, decrease eating out, and work on meal planning and easy cooking plans [name] was given a refill on her Contrave x1. She will decrease her Contrave to 1 PO QAM until nausea improves and then will increase her dose to 2 pills QAM to avoid insomnia.  Katelyn Walker has agreed  to follow-up with our clinic in 2 weeks. She was informed of the importance of frequent follow-up visits to maximize her success with intensive lifestyle modifications for her multiple health conditions.  ALLERGIES: No Known Allergies  MEDICATIONS: Current Outpatient Medications on File Prior to Visit  Medication Sig Dispense Refill  . Cholecalciferol (VITAMIN D3) 50 MCG (2000  UT) TABS Take 2,000 Units by mouth daily. 30 tablet 0  . levothyroxine (SYNTHROID, LEVOTHROID) 75 MCG tablet Take 75 mcg by mouth daily before breakfast.    . Norethindrone Acetate-Ethinyl Estrad-FE (BLISOVI 24 FE) 1-20 MG-MCG(24) tablet Take 1 tablet by mouth daily.    Marland Kitchen. omeprazole (PRILOSEC) 40 MG capsule Take 40 mg by mouth daily.    . valsartan (DIOVAN) 80 MG tablet Take 80 mg by mouth daily.    Marland Kitchen. zolpidem (AMBIEN) 10 MG tablet Take 10 mg by mouth at bedtime as needed for sleep.     No current facility-administered medications on file prior to visit.     PAST MEDICAL HISTORY: Past Medical History:  Diagnosis Date  . Back pain   . Dyspnea   . Elevated blood pressure reading without diagnosis of hypertension   . Esophageal ulcer   . GERD (gastroesophageal reflux disease)   . Hot flashes   . Hypothyroid   . Joint pain   . Sciatica, left side   . Vitamin B 12 deficiency   . Vitamin D deficiency     PAST SURGICAL HISTORY: Past Surgical History:  Procedure Laterality Date  . DILATION AND CURETTAGE OF UTERUS     2011   . OVARIAN CYST SURGERY     2009, 2013    SOCIAL HISTORY: Social History   Tobacco Use  . Smoking status: Never Smoker  . Smokeless tobacco: Never Used  Substance Use Topics  . Alcohol use: Not on file  . Drug use: Not on file    FAMILY HISTORY: Family History  Problem Relation Age of Onset  . Alcoholism Mother   . Eating disorder Mother   . Diabetes Father   . Hypertension Father   . Sleep apnea Father    ROS: Review of Systems  Psychiatric/Behavioral: The patient has insomnia.    PHYSICAL EXAM: Pt in no acute distress  RECENT LABS AND TESTS: BMET    Component Value Date/Time   NA 140 03/19/2019 1035   K 4.7 03/19/2019 1035   CL 102 03/19/2019 1035   CO2 22 03/19/2019 1035   GLUCOSE 80 03/19/2019 1035   BUN 8 03/19/2019 1035   CREATININE 0.97 03/19/2019 1035   CALCIUM 8.5 (L) 03/19/2019 1035   GFRNONAA 69 03/19/2019 1035   GFRAA  80 03/19/2019 1035   Lab Results  Component Value Date   HGBA1C 5.8 (H) 03/19/2019   HGBA1C 5.8 (H) 10/13/2018   HGBA1C 5.8 (H) 05/14/2018   Lab Results  Component Value Date   INSULIN 17.7 03/19/2019   INSULIN 17.4 10/13/2018   INSULIN 18.3 05/14/2018   CBC    Component Value Date/Time   WBC 8.7 05/14/2018 1138   WBC 13.7 (H) 09/09/2007 0546   RBC 4.96 05/14/2018 1138   RBC 3.11 (L) 09/09/2007 0546   HGB 12.0 05/14/2018 1138   HCT 38.8 05/14/2018 1138   PLT 197 09/09/2007 0546   MCV 78 (L) 05/14/2018 1138   MCH 24.2 (L) 05/14/2018 1138   MCHC 30.9 (L) 05/14/2018 1138   MCHC 33.6 09/09/2007 0546   RDW 16.7 (H) 05/14/2018 1138   LYMPHSABS  1.8 05/14/2018 1138   EOSABS 0.1 05/14/2018 1138   BASOSABS 0.0 05/14/2018 1138   Iron/TIBC/Ferritin/ %Sat No results found for: IRON, TIBC, FERRITIN, IRONPCTSAT Lipid Panel     Component Value Date/Time   CHOL 170 03/19/2019 1035   TRIG 157 (H) 03/19/2019 1035   HDL 34 (L) 03/19/2019 1035   LDLCALC 105 (H) 03/19/2019 1035   Hepatic Function Panel     Component Value Date/Time   PROT 6.5 03/19/2019 1035   ALBUMIN 3.8 03/19/2019 1035   AST 10 03/19/2019 1035   ALT 6 03/19/2019 1035   ALKPHOS 72 03/19/2019 1035   BILITOT 0.2 03/19/2019 1035      Component Value Date/Time   TSH 3.300 03/19/2019 1035   TSH 2.830 05/14/2018 1138   Results for Gevena BarreCRIHFIELD, Naiyana G (MRN [id number]) as of 07/02/2019 10:11  Ref. Range 03/19/2019 10:35  Vitamin D, 25-Hydroxy Latest Ref Range: 30.0 - 100.0 ng/mL 54.2   I, Marianna Paymentenise Haag, am acting as Energy managertranscriptionist for Quillian Quincearen Senovia Gauer, MD  I have reviewed the above documentation for accuracy and completeness, and I agree with the above. -Quillian Quincearen Zephyr Sausedo, MD

## 2019-07-16 ENCOUNTER — Telehealth (INDEPENDENT_AMBULATORY_CARE_PROVIDER_SITE_OTHER): Payer: BC Managed Care – PPO | Admitting: Family Medicine

## 2019-07-16 ENCOUNTER — Other Ambulatory Visit: Payer: Self-pay

## 2019-07-16 ENCOUNTER — Encounter (INDEPENDENT_AMBULATORY_CARE_PROVIDER_SITE_OTHER): Payer: Self-pay | Admitting: Family Medicine

## 2019-07-16 DIAGNOSIS — G4709 Other insomnia: Secondary | ICD-10-CM | POA: Diagnosis not present

## 2019-07-16 DIAGNOSIS — Z6837 Body mass index (BMI) 37.0-37.9, adult: Secondary | ICD-10-CM

## 2019-07-20 NOTE — Progress Notes (Signed)
Office: [phone]  /  Fax: [phone] TeleHealth Visit:  Katelyn BarreSusanne G Walker has verbally consented to this TeleHealth visit today. The patient is located at work, the provider is located at the [facility]. The participants in this visit include the listed provider and patient. The visit was conducted today via FaceTime.  HPI:   Chief Complaint: OBESITY Katelyn Walker is here to discuss her progress with her obesity treatment plan. She is keeping a food journal with 1200 calories and 80 grams of protein and is following her eating plan approximately 80% of the time. She states she walked around the neighborhood once last week. Katelyn Walker states her weight this a.m. was 239, which is down 2 lbs from her last visit. She feels her hunger and cravings have improved and is doing well working on increasing her lean protein and vegetables. We were unable to weigh the patient today for this TeleHealth visit. She feels as if she has lost 2 lbs since her last visit. She has lost 2 lbs since starting treatment with us.  Insomnia Katelyn Walker notes her sleeping has improved when she changed her Contrave dose to 2 QAM with no PM dose. She repots her mood is stable and she wakes refreshed in the AM.   ASSESSMENT AND PLAN:  Other insomnia  Class 2 severe obesity with serious comorbidity and body mass index (BMI) of 37.0 to 37.9 in adult, unspecified obesity type (HCC)  PLAN:  Insomnia Katelyn Walker will continue Contrave as is and will not add the second dose in the PM.  I spent > than 50% of the 15 minute visit on counseling as documented in the note.  Obesity Katelyn Walker is currently in the action stage of change. As such, her goal is to continue with weight loss efforts. She has agreed to keep a food journal with 1200 calories and 80 grams of protein.  Katelyn Walker has been instructed to work up to a goal of 150 minutes of combined cardio and strengthening exercise per week for weight loss and  overall health benefits. We discussed the following Behavioral Modification Strategies today: decreasing simple carbohydrates, work on meal planning and easy cooking plans, and keep a strict food journal.  Katelyn Walker has agreed to follow-up with our clinic in 2 weeks. She was informed of the importance of frequent follow-up visits to maximize her success with intensive lifestyle modifications for her multiple health conditions.  ALLERGIES: No Known Allergies  MEDICATIONS: Current Outpatient Medications on File Prior to Visit  Medication Sig Dispense Refill  . Cholecalciferol (VITAMIN D3) 50 MCG (2000 UT) TABS Take 2,000 Units by mouth daily. 30 tablet 0  . levothyroxine (SYNTHROID, LEVOTHROID) 75 MCG tablet Take 75 mcg by mouth daily before breakfast.    . metFORMIN (GLUCOPHAGE) 500 MG tablet Take 1 tablet (500 mg total) by mouth 2 (two) times daily with a meal. 60 tablet 0  . Naltrexone-buPROPion HCl ER 8-90 MG TB12 Take two tabs twice daily 120 tablet 0  . Norethindrone Acetate-Ethinyl Estrad-FE (BLISOVI 24 FE) 1-20 MG-MCG(24) tablet Take 1 tablet by mouth daily.    Marland Kitchen. omeprazole (PRILOSEC) 40 MG capsule Take 40 mg by mouth daily.    . valsartan (DIOVAN) 80 MG tablet Take 80 mg by mouth daily.    Marland Kitchen. zolpidem (AMBIEN) 10 MG tablet Take 10 mg by mouth at bedtime as needed for sleep.     No current facility-administered medications on file prior to visit.     PAST MEDICAL HISTORY: Past  Medical History:  Diagnosis Date  . Back pain   . Dyspnea   . Elevated blood pressure reading without diagnosis of hypertension   . Esophageal ulcer   . GERD (gastroesophageal reflux disease)   . Hot flashes   . Hypothyroid   . Joint pain   . Sciatica, left side   . Vitamin B 12 deficiency   . Vitamin D deficiency     PAST SURGICAL HISTORY: Past Surgical History:  Procedure Laterality Date  . DILATION AND CURETTAGE OF UTERUS     2011   . OVARIAN CYST SURGERY     2009, 2013    SOCIAL HISTORY:  Social History   Tobacco Use  . Smoking status: Never Smoker  . Smokeless tobacco: Never Used  Substance Use Topics  . Alcohol use: Not on file  . Drug use: Not on file    FAMILY HISTORY: Family History  Problem Relation Age of Onset  . Alcoholism Mother   . Eating disorder Mother   . Diabetes Father   . Hypertension Father   . Sleep apnea Father    ROS: Review of Systems  Psychiatric/Behavioral: The patient has insomnia.    PHYSICAL EXAM: Pt in no acute distress  RECENT LABS AND TESTS: BMET    Component Value Date/Time   NA 140 03/19/2019 1035   K 4.7 03/19/2019 1035   CL 102 03/19/2019 1035   CO2 22 03/19/2019 1035   GLUCOSE 80 03/19/2019 1035   BUN 8 03/19/2019 1035   CREATININE 0.97 03/19/2019 1035   CALCIUM 8.5 (L) 03/19/2019 1035   GFRNONAA 69 03/19/2019 1035   GFRAA 80 03/19/2019 1035   Lab Results  Component Value Date   HGBA1C 5.8 (H) 03/19/2019   HGBA1C 5.8 (H) 10/13/2018   HGBA1C 5.8 (H) 05/14/2018   Lab Results  Component Value Date   INSULIN 17.7 03/19/2019   INSULIN 17.4 10/13/2018   INSULIN 18.3 05/14/2018   CBC    Component Value Date/Time   WBC 8.7 05/14/2018 1138   WBC 13.7 (H) 09/09/2007 0546   RBC 4.96 05/14/2018 1138   RBC 3.11 (L) 09/09/2007 0546   HGB 12.0 05/14/2018 1138   HCT 38.8 05/14/2018 1138   PLT 197 09/09/2007 0546   MCV 78 (L) 05/14/2018 1138   MCH 24.2 (L) 05/14/2018 1138   MCHC 30.9 (L) 05/14/2018 1138   MCHC 33.6 09/09/2007 0546   RDW 16.7 (H) 05/14/2018 1138   LYMPHSABS 1.8 05/14/2018 1138   EOSABS 0.1 05/14/2018 1138   BASOSABS 0.0 05/14/2018 1138   Iron/TIBC/Ferritin/ %Sat No results found for: IRON, TIBC, FERRITIN, IRONPCTSAT Lipid Panel     Component Value Date/Time   CHOL 170 03/19/2019 1035   TRIG 157 (H) 03/19/2019 1035   HDL 34 (L) 03/19/2019 1035   LDLCALC 105 (H) 03/19/2019 1035   Hepatic Function Panel     Component Value Date/Time   PROT 6.5 03/19/2019 1035   ALBUMIN 3.8 03/19/2019  1035   AST 10 03/19/2019 1035   ALT 6 03/19/2019 1035   ALKPHOS 72 03/19/2019 1035   BILITOT 0.2 03/19/2019 1035      Component Value Date/Time   TSH 3.300 03/19/2019 1035   TSH 2.830 05/14/2018 1138   Results for JALIN, ERPELDING (MRN [id number]) as of 07/20/2019 10:06  Ref. Range 03/19/2019 10:35  Vitamin D, 25-Hydroxy Latest Ref Range: 30.0 - 100.0 ng/mL 54.2   I, Michaelene Song, am acting as Location manager for Dennard Nip, MD I have  reviewed the above documentation for accuracy and completeness, and I agree with the above. -Quillian Quincearen Beasley, MD

## 2019-07-26 IMAGING — MR MR LUMBAR SPINE W/O CM
4 of 5 series · 19 of 48 positions shown · non-contrast
Comparison: Radiography 04/06/2018

CLINICAL DATA: Low back pain and left leg pain over the last 8
weeks.

EXAM:
MRI LUMBAR SPINE WITHOUT CONTRAST
TECHNIQUE: Multiplanar, multisequence MR imaging of the lumbar spine was
performed. No intravenous contrast was administered.

[Series 9: T2 · sagittal · 4.0mm · 0.73mm/px · 6 of 15 slices shown (1 of 2)]
[im 1/15]
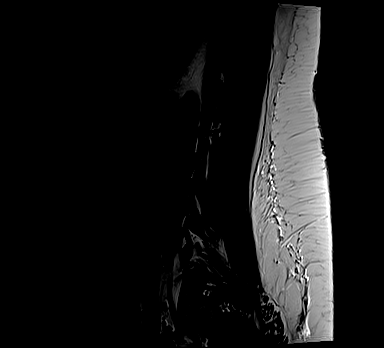
[im 3/15]
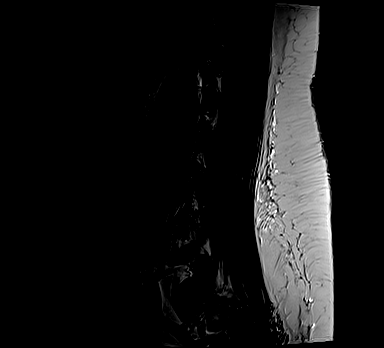
[im 6/15]
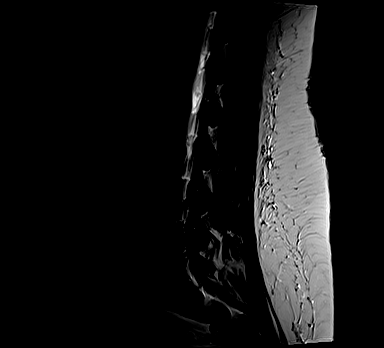
[im 9/15]
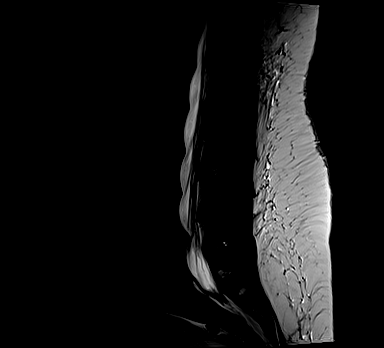
[im 12/15]
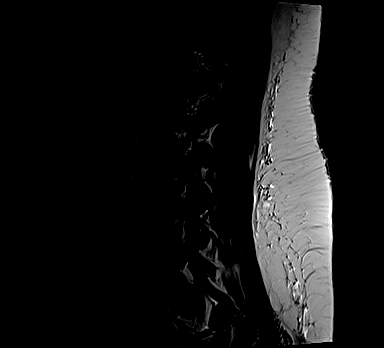
[im 15/15]
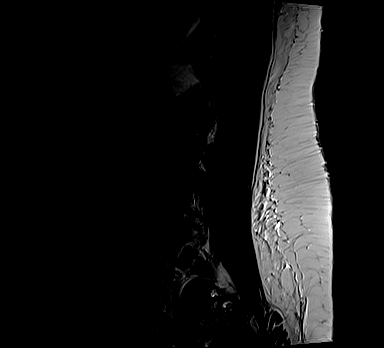

[Series 10: T1 · sagittal · 4.0mm · 0.73mm/px · 3 of 15 slices shown (1 of 2)]
[im 3/15]
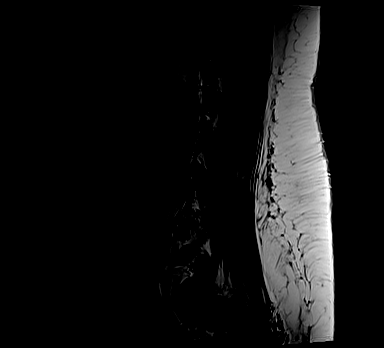
[im 9/15]
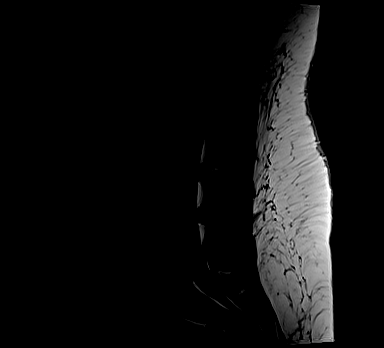
[im 15/15]
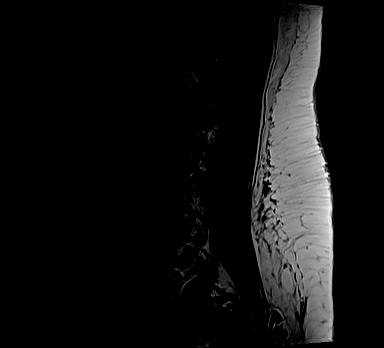

[Series 17: T2 · axial · 4.0mm · 0.28mm/px · z∈[-108,+72]mm · 7 of 39 slices shown (2 of 2)]
[im 1/39]
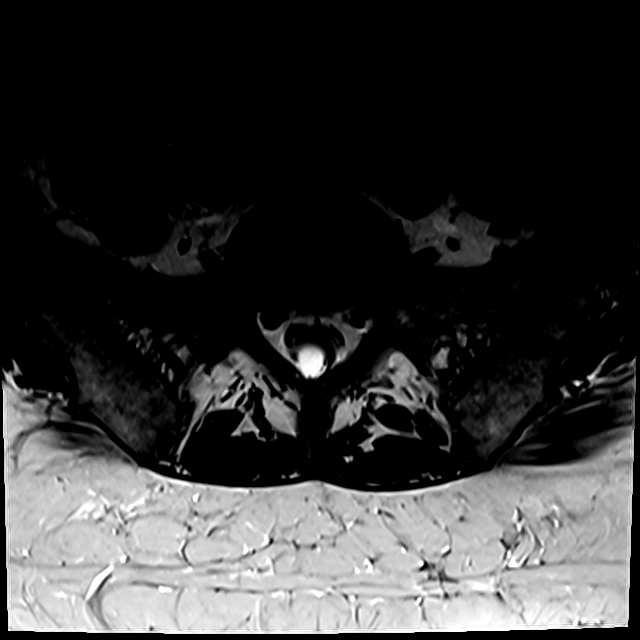
[im 6/39]
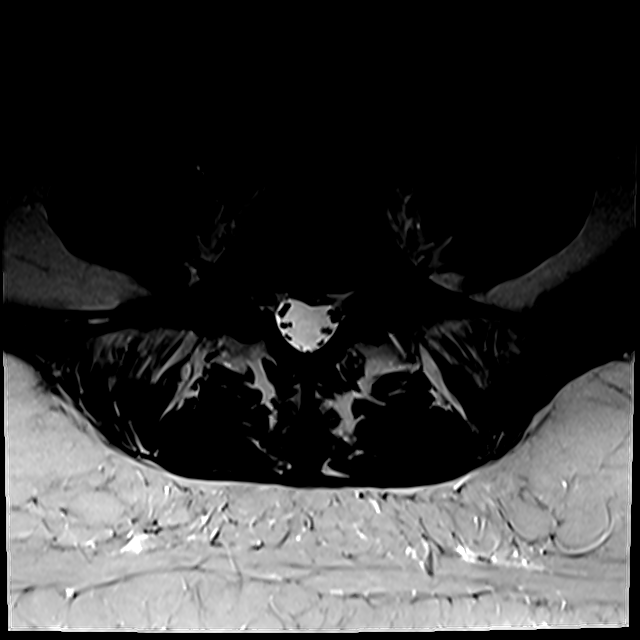
[im 11/39]
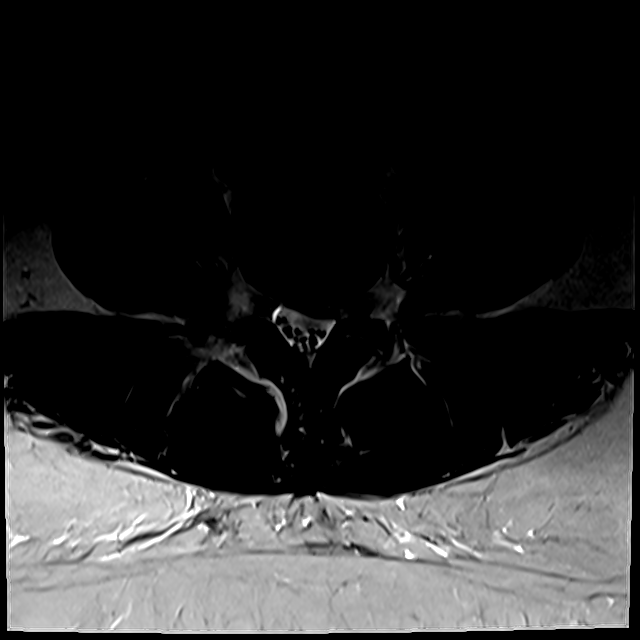
[im 17/39]
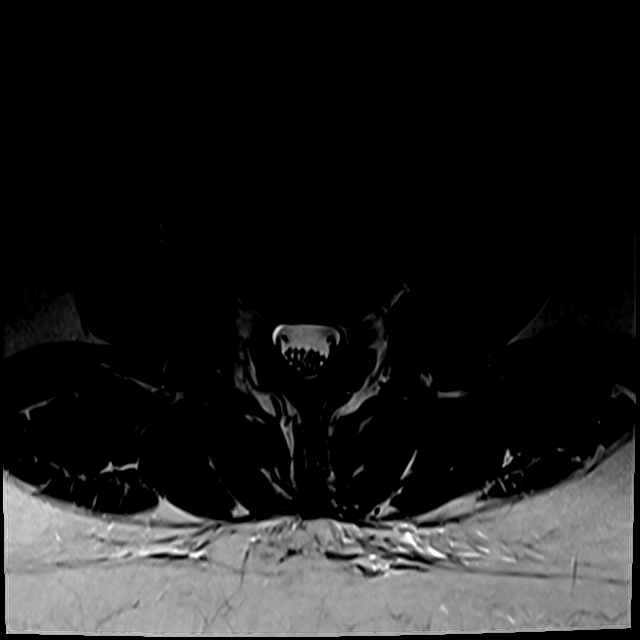
[im 20/39]
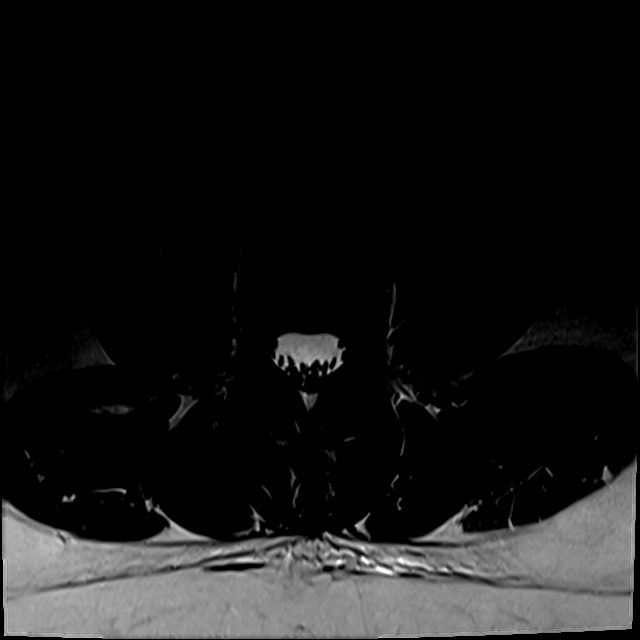
[im 22/39]
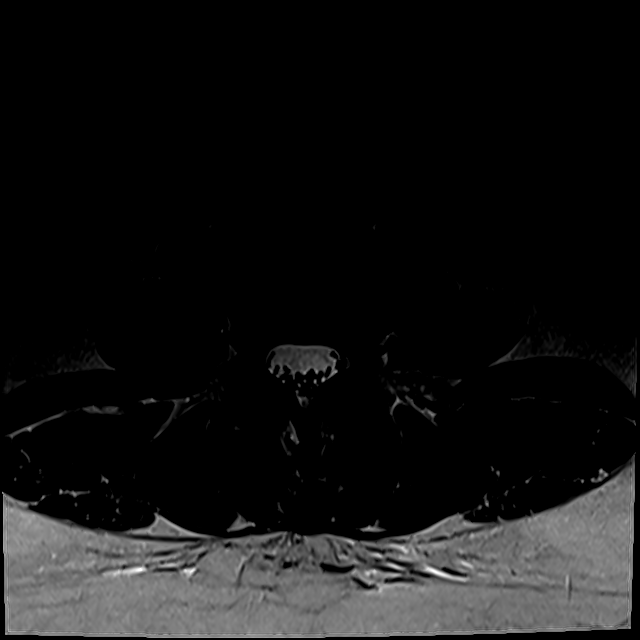
[im 33/39]
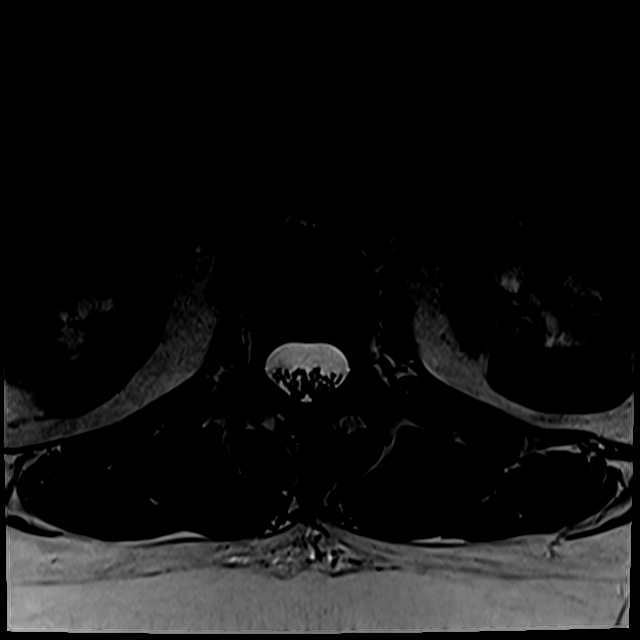

[Series 100: T1 · axial · 4.0mm · 0.28mm/px · z∈[-83,+72]mm · 3 of 39 slices shown (2 of 2)]
[im 6/39]
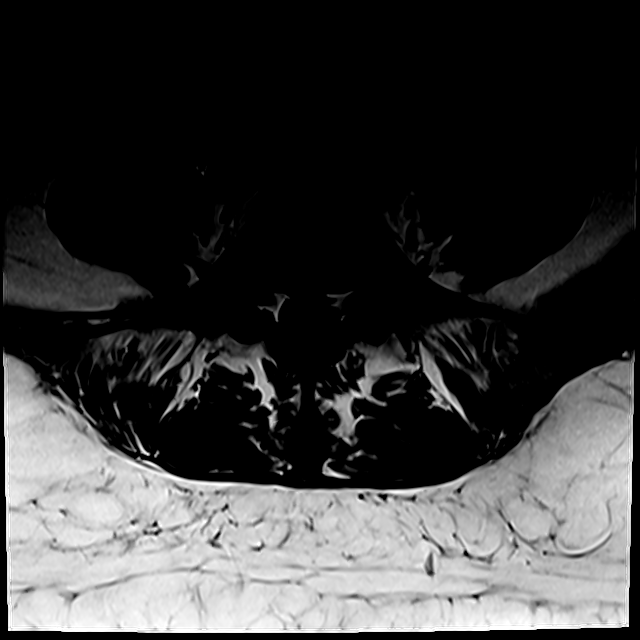
[im 20/39]
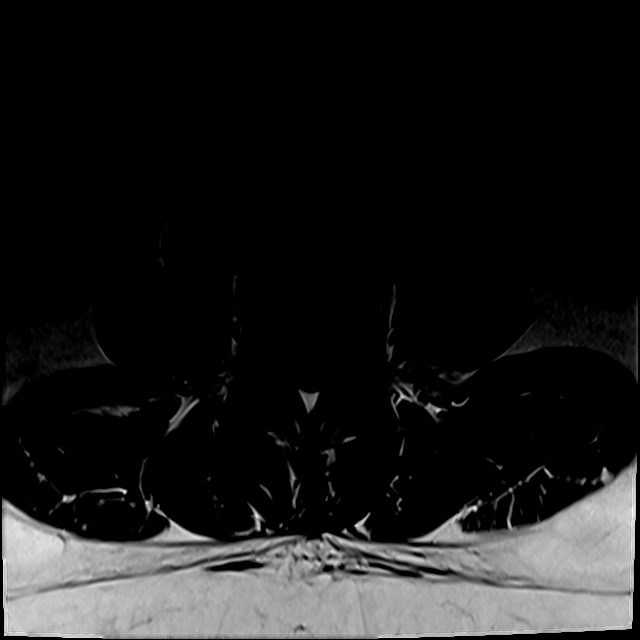
[im 33/39]
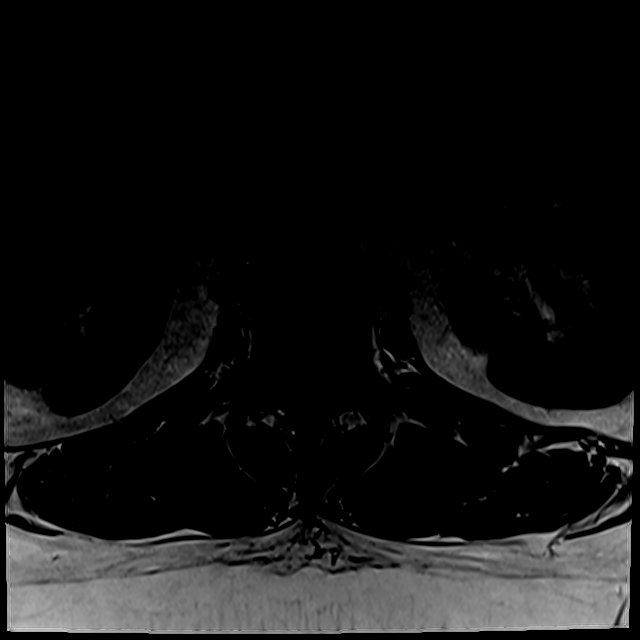

[19 of 48 positions shown; findings below may reference images not displayed]

FINDINGS: Segmentation:  5 lumbar type vertebral bodies.

Alignment:  Normal

Vertebrae:  Normal

Conus medullaris and cauda equina: Conus extends to the L1 level.
Conus and cauda equina appear normal.

Paraspinal and other soft tissues: Negative

Disc levels:

No significant disc finding at T11-12, T12-L1 or L1-2. There is mild
facet hypertrophy at those levels but no compressive stenosis.

L2-3: Minimal disc bulge.  No stenosis of the canal or foramina.

L3-4: Minimal disc bulge.  No stenosis.

L4-5: Large broad-based left posterolateral predominant disc
herniation with slight caudal down turning. Stenosis of the left
side of the canal with high likely head of left L5 nerve compression
at least.

L5-S1: No disc abnormality.  Minimal facet hypertrophy.
IMPRESSION: Large left posterolateral predominant disc herniation at L4-5 with
slight caudal down turning. Spinal stenosis on the left virtually
certain to compress at least the left L5 nerve.

## 2019-07-29 ENCOUNTER — Other Ambulatory Visit (INDEPENDENT_AMBULATORY_CARE_PROVIDER_SITE_OTHER): Payer: Self-pay | Admitting: Family Medicine

## 2019-07-30 ENCOUNTER — Encounter (INDEPENDENT_AMBULATORY_CARE_PROVIDER_SITE_OTHER): Payer: Self-pay | Admitting: Family Medicine

## 2019-07-30 ENCOUNTER — Telehealth (INDEPENDENT_AMBULATORY_CARE_PROVIDER_SITE_OTHER): Payer: BC Managed Care – PPO | Admitting: Family Medicine

## 2019-07-30 ENCOUNTER — Other Ambulatory Visit: Payer: Self-pay

## 2019-07-30 DIAGNOSIS — Z6837 Body mass index (BMI) 37.0-37.9, adult: Secondary | ICD-10-CM

## 2019-07-30 DIAGNOSIS — R7303 Prediabetes: Secondary | ICD-10-CM

## 2019-07-30 MED ORDER — METFORMIN HCL 500 MG PO TABS: 500.0000 mg | ORAL_TABLET | Freq: Two times a day (BID) | ORAL | 0 refills | Status: DC

## 2019-08-05 NOTE — Progress Notes (Signed)
Office: [phone]  /  Fax: [phone] TeleHealth Visit:  Katelyn Walker has verbally consented to this TeleHealth visit today. The patient is located at home, the provider is located at the [facility] office. The participants in this visit include the listed provider and patient. The visit was conducted today via face time.  HPI:   Chief Complaint: OBESITY Katelyn Walker is here to discuss her progress with her obesity treatment plan. She is on the keep a food journal with 1200 calories and 80 grams of protein daily and is following her eating plan approximately 80 % of the time. She states she is exercising 0 minutes 0 times per week. Katelyn Walker states she has gained 2 lbs since her last visit, but she feels her clothes are loser ans she is doing better on her eating plan.  We were unable to weigh the patient today for this TeleHealth visit. She feels as if she has gained 2 lbs since her last visit. She has lost 2 lbs since starting treatment with us.  Pre-Diabetes Katelyn Walker has a diagnosis of pre-diabetes based on her elevated Hgb A1c and was informed this puts her at greater risk of developing diabetes. She is stable on metformin, and denies nausea, vomiting, or hypoglycemia. She is working on diet and weight loss to decrease risk of diabetes.   ASSESSMENT AND PLAN:  Class 2 severe obesity with serious comorbidity and body mass index (BMI) of 37.0 to 37.9 in adult, unspecified obesity type (HCC)  Prediabetes - Plan: metFORMIN (GLUCOPHAGE) 500 MG tablet  PLAN:  Pre-Diabetes Katelyn Walker will continue to work on weight loss, exercise, and decreasing simple carbohydrates in her diet to help decrease the risk of diabetes. We dicussed metformin including benefits and risks. She was informed that eating too many simple carbohydrates or too many calories at one sitting increases the likelihood of GI side effects. Katelyn Walker agrees to continue taking metformin 500 mg BID #60 and we will  refill for 1 month. We will recheck labs at her next in office visit. Katelyn Walker agrees to follow up with our clinic in 2 weeks as directed to monitor her progress.  Obesity Katelyn Walker is currently in the action stage of change. As such, her goal is to continue with weight loss efforts She has agreed to keep a food journal with 1200 calories and 80 grams of protein daily Katelyn Walker has been instructed to work up to a goal of 150 minutes of combined cardio and strengthening exercise per week for weight loss and overall health benefits. We discussed the following Behavioral Modification Strategies today: keeping healthy foods in the home and better snacking choices We discussed various medication options to help Katelyn Walker with her weight loss efforts and we both agreed to continue taking Contrave as is 2 tablets q AM, no PM dose and will continue to follow up.   Katelyn Walker has agreed to follow up with our clinic in 2 weeks. She was informed of the importance of frequent follow up visits to maximize her success with intensive lifestyle modifications for her multiple health conditions.  ALLERGIES: No Known Allergies  MEDICATIONS: Current Outpatient Medications on File Prior to Visit  Medication Sig Dispense Refill  . Cholecalciferol (VITAMIN D3) 50 MCG (2000 UT) TABS Take 2,000 Units by mouth daily. 30 tablet 0  . levothyroxine (SYNTHROID, LEVOTHROID) 75 MCG tablet Take 75 mcg by mouth daily before breakfast.    . Naltrexone-buPROPion HCl ER 8-90 MG TB12 Take two tabs twice daily 120 tablet  0  . Norethindrone Acetate-Ethinyl Estrad-FE (BLISOVI 24 FE) 1-20 MG-MCG(24) tablet Take 1 tablet by mouth daily.    Marland Kitchen. omeprazole (PRILOSEC) 40 MG capsule Take 40 mg by mouth daily.    . valsartan (DIOVAN) 80 MG tablet Take 80 mg by mouth daily.    Marland Kitchen. zolpidem (AMBIEN) 10 MG tablet Take 10 mg by mouth at bedtime as needed for sleep.     No current facility-administered medications on file prior to visit.     PAST  MEDICAL HISTORY: Past Medical History:  Diagnosis Date  . Back pain   . Dyspnea   . Elevated blood pressure reading without diagnosis of hypertension   . Esophageal ulcer   . GERD (gastroesophageal reflux disease)   . Hot flashes   . Hypothyroid   . Joint pain   . Sciatica, left side   . Vitamin B 12 deficiency   . Vitamin D deficiency     PAST SURGICAL HISTORY: Past Surgical History:  Procedure Laterality Date  . DILATION AND CURETTAGE OF UTERUS     2011   . OVARIAN CYST SURGERY     2009, 2013    SOCIAL HISTORY: Social History   Tobacco Use  . Smoking status: Never Smoker  . Smokeless tobacco: Never Used  Substance Use Topics  . Alcohol use: Not on file  . Drug use: Not on file    FAMILY HISTORY: Family History  Problem Relation Age of Onset  . Alcoholism Mother   . Eating disorder Mother   . Diabetes Father   . Hypertension Father   . Sleep apnea Father     ROS: Review of Systems  Constitutional: Negative for weight loss.  Gastrointestinal: Negative for nausea and vomiting.  Endo/Heme/Allergies:       Negative hypoglycemia    PHYSICAL EXAM: Pt in no acute distress  RECENT LABS AND TESTS: BMET    Component Value Date/Time   NA 140 03/19/2019 1035   K 4.7 03/19/2019 1035   CL 102 03/19/2019 1035   CO2 22 03/19/2019 1035   GLUCOSE 80 03/19/2019 1035   BUN 8 03/19/2019 1035   CREATININE 0.97 03/19/2019 1035   CALCIUM 8.5 (L) 03/19/2019 1035   GFRNONAA 69 03/19/2019 1035   GFRAA 80 03/19/2019 1035   Lab Results  Component Value Date   HGBA1C 5.8 (H) 03/19/2019   HGBA1C 5.8 (H) 10/13/2018   HGBA1C 5.8 (H) 05/14/2018   Lab Results  Component Value Date   INSULIN 17.7 03/19/2019   INSULIN 17.4 10/13/2018   INSULIN 18.3 05/14/2018   CBC    Component Value Date/Time   WBC 8.7 05/14/2018 1138   WBC 13.7 (H) 09/09/2007 0546   RBC 4.96 05/14/2018 1138   RBC 3.11 (L) 09/09/2007 0546   HGB 12.0 05/14/2018 1138   HCT 38.8 05/14/2018 1138    PLT 197 09/09/2007 0546   MCV 78 (L) 05/14/2018 1138   MCH 24.2 (L) 05/14/2018 1138   MCHC 30.9 (L) 05/14/2018 1138   MCHC 33.6 09/09/2007 0546   RDW 16.7 (H) 05/14/2018 1138   LYMPHSABS 1.8 05/14/2018 1138   EOSABS 0.1 05/14/2018 1138   BASOSABS 0.0 05/14/2018 1138   Iron/TIBC/Ferritin/ %Sat No results found for: IRON, TIBC, FERRITIN, IRONPCTSAT Lipid Panel     Component Value Date/Time   CHOL 170 03/19/2019 1035   TRIG 157 (H) 03/19/2019 1035   HDL 34 (L) 03/19/2019 1035   LDLCALC 105 (H) 03/19/2019 1035   Hepatic Function Panel  Component Value Date/Time   PROT 6.5 03/19/2019 1035   ALBUMIN 3.8 03/19/2019 1035   AST 10 03/19/2019 1035   ALT 6 03/19/2019 1035   ALKPHOS 72 03/19/2019 1035   BILITOT 0.2 03/19/2019 1035      Component Value Date/Time   TSH 3.300 03/19/2019 1035   TSH 2.830 05/14/2018 1138      I, Trixie Dredge, am acting as transcriptionist for Dennard Nip, MD I have reviewed the above documentation for accuracy and completeness, and I agree with the above. -Dennard Nip, MD

## 2019-08-17 ENCOUNTER — Ambulatory Visit (INDEPENDENT_AMBULATORY_CARE_PROVIDER_SITE_OTHER): Payer: Self-pay | Admitting: Family Medicine

## 2019-08-17 ENCOUNTER — Encounter (INDEPENDENT_AMBULATORY_CARE_PROVIDER_SITE_OTHER): Payer: Self-pay

## 2019-08-27 ENCOUNTER — Other Ambulatory Visit: Payer: Self-pay

## 2019-08-27 ENCOUNTER — Encounter (INDEPENDENT_AMBULATORY_CARE_PROVIDER_SITE_OTHER): Payer: Self-pay | Admitting: Family Medicine

## 2019-08-27 ENCOUNTER — Ambulatory Visit (INDEPENDENT_AMBULATORY_CARE_PROVIDER_SITE_OTHER): Payer: BC Managed Care – PPO | Admitting: Family Medicine

## 2019-08-27 VITALS — BP 126/85 | HR 96 | Temp 98.3°F | Ht 67.0 in | Wt 239.0 lb

## 2019-08-27 DIAGNOSIS — Z9189 Other specified personal risk factors, not elsewhere classified: Secondary | ICD-10-CM

## 2019-08-27 DIAGNOSIS — R7303 Prediabetes: Secondary | ICD-10-CM

## 2019-08-27 DIAGNOSIS — Z6837 Body mass index (BMI) 37.0-37.9, adult: Secondary | ICD-10-CM

## 2019-08-27 DIAGNOSIS — G4489 Other headache syndrome: Secondary | ICD-10-CM

## 2019-08-27 MED ORDER — RYBELSUS 3 MG PO TABS: 3.0000 mg | ORAL_TABLET | ORAL | 0 refills | Status: DC

## 2019-08-27 MED ORDER — NALTREXONE-BUPROPION HCL ER 8-90 MG PO TB12: ORAL_TABLET | ORAL | 0 refills | Status: DC

## 2019-08-28 ENCOUNTER — Other Ambulatory Visit (INDEPENDENT_AMBULATORY_CARE_PROVIDER_SITE_OTHER): Payer: Self-pay | Admitting: Family Medicine

## 2019-08-28 NOTE — Telephone Encounter (Signed)
Please review and advise.

## 2019-08-31 NOTE — Progress Notes (Signed)
Office: [phone]  /  Fax: [phone]   HPI:   Chief Complaint: OBESITY Katelyn SandhoffSusanne is here to discuss her progress with her obesity treatment plan. She is on the keep a food journal with 1200 calories and 80 grams of protein daily plan and is following her eating plan approximately 75 % of the time. She states she is walking 30 minutes 1 time per week. Katelyn SandhoffSusanne continues to work on weight loss. She is journaling off and on, but she feels she is making better choices overall. Her hunger is not controlled though. Her weight is 239 lb (108.4 kg) [name] and has had a weight loss of 1 pound since her last in-office visit. She has lost 3 lbs since starting treatment with us.  Pre-Diabetes Katelyn SandhoffSusanne has a diagnosis of prediabetes based on her elevated Hgb A1c and was informed this puts her at greater risk of developing diabetes. Katelyn SandhoffSusanne is taking metformin currently and she continues to work on diet and exercise to decrease risk of diabetes. She states she is still struggling with polyphagia, even while following her plan closely. Katelyn SandhoffSusanne denies nausea, vomiting or hypoglycemia.  At risk for diabetes Katelyn SandhoffSusanne is at higher than average risk for developing diabetes due to her obesity and prediabetes. She currently denies polyuria or polydipsia.  Headache Katelyn SandhoffSusanne notes mild headache of she takes her Contrave on an empty stomach. Overall, it doesn't bother her much, if she takes it with food. Her blood pressure is stable.  ASSESSMENT AND PLAN:  Prediabetes - Plan: Semaglutide (RYBELSUS) 3 MG TABS  Other headache syndrome  At risk for diabetes mellitus  Class 2 severe obesity with serious comorbidity and body mass index (BMI) of 37.0 to 37.9 in adult, unspecified obesity type (HCC) - Plan: DISCONTINUED: Naltrexone-buPROPion HCl ER 8-90 MG TB12  PLAN:  Pre-Diabetes Katelyn SandhoffSusanne will continue to work on weight loss, exercise, and decreasing simple carbohydrates in her diet to help decrease the risk of  diabetes. We dicussed metformin including benefits and risks. She was informed that eating too many simple carbohydrates or too many calories at one sitting increases the likelihood of GI side effects. Katelyn SandhoffSusanne agrees to start Rybelsus 3 mg daily once daily every morning #30 with no refills and follow up with us as directed to monitor her progress.  Diabetes risk counseling Katelyn SandhoffSusanne was given extended (15 minutes) diabetes prevention counseling [name]. She is 48 y.o. female and has risk factors for diabetes including obesity and prediabetes. We discussed intensive lifestyle modifications [name] with an emphasis on weight loss as well as increasing exercise and decreasing simple carbohydrates in her diet.  Headache Katelyn SandhoffSusanne agrees to continue Contrave with food and she will increase her water intake to 80+ ounces daily. Katelyn SandhoffSusanne agrees to follow up with our clinic in 2 weeks.  Obesity Katelyn SandhoffSusanne is currently in the action stage of [name]. As such, her goal is to continue with weight loss efforts She has [name] to keep a food journal with 1200 calories and 80+ grams of protein daily Katelyn SandhoffSusanne has been instructed to work up to a goal of 150 minutes of combined cardio and strengthening exercise per week for weight loss and overall health benefits. We discussed the following Behavioral Modification Strategies [name]: increasing lean protein intake and decreasing simple carbohydrates   Katelyn SandhoffSusanne agrees to continue Contrave 8-90 mg 2 tablets by mouth twice a day #120 with no refills.  Katelyn SandhoffSusanne has [name] to follow up with our clinic in 2 weeks. She was informed of the importance of  frequent follow up visits to maximize her success with intensive lifestyle modifications for her multiple health conditions.  ALLERGIES: No Known Allergies  MEDICATIONS: Current Outpatient Medications on File Prior to Visit  Medication Sig Dispense Refill  . Cholecalciferol (VITAMIN D3) 50 MCG (2000 UT) TABS Take 2,000 Units by mouth  daily. 30 tablet 0  . levothyroxine (SYNTHROID, LEVOTHROID) 75 MCG tablet Take 75 mcg by mouth daily before breakfast.    . metFORMIN (GLUCOPHAGE) 500 MG tablet Take 1 tablet (500 mg total) by mouth 2 (two) times daily with a meal. 60 tablet 0  . Norethindrone Acetate-Ethinyl Estrad-FE (BLISOVI 24 FE) 1-20 MG-MCG(24) tablet Take 1 tablet by mouth daily.    Marland Kitchen omeprazole (PRILOSEC) 40 MG capsule Take 40 mg by mouth daily.    . valsartan (DIOVAN) 80 MG tablet Take 80 mg by mouth daily.    Marland Kitchen zolpidem (AMBIEN) 10 MG tablet Take 10 mg by mouth at bedtime as needed for sleep.     No current facility-administered medications on file prior to visit.     PAST MEDICAL HISTORY: Past Medical History:  Diagnosis Date  . Back pain   . Dyspnea   . Elevated blood pressure reading without diagnosis of hypertension   . Esophageal ulcer   . GERD (gastroesophageal reflux disease)   . Hot flashes   . Hypothyroid   . Joint pain   . Sciatica, left side   . Vitamin B 12 deficiency   . Vitamin D deficiency     PAST SURGICAL HISTORY: Past Surgical History:  Procedure Laterality Date  . DILATION AND CURETTAGE OF UTERUS     2011   . OVARIAN CYST SURGERY     2009, 2013    SOCIAL HISTORY: Social History   Tobacco Use  . Smoking status: Never Smoker  . Smokeless tobacco: Never Used  Substance Use Topics  . Alcohol use: Not on file  . Drug use: Not on file    FAMILY HISTORY: Family History  Problem Relation Age of Onset  . Alcoholism Mother   . Eating disorder Mother   . Diabetes Father   . Hypertension Father   . Sleep apnea Father     ROS: Review of Systems  Constitutional: Positive for weight loss.  Gastrointestinal: Negative for nausea and vomiting.  Genitourinary: Negative for frequency.  Neurological: Positive for headaches.  Endo/Heme/Allergies: Negative for polydipsia.       Positive for polyphagia Negative for hypoglycemia    PHYSICAL EXAM: Blood pressure 126/85, pulse  96, temperature 98.3 F (36.8 C), temperature source Oral, height 5\' 7"  (1.702 m), weight 239 lb (108.4 kg), SpO2 98 %. Body mass index is 37.43 kg/m. Physical Exam Vitals signs reviewed.  Constitutional:      Appearance: Normal appearance. She is well-developed. She is obese.  Cardiovascular:     Rate and Rhythm: Normal rate.  Pulmonary:     Effort: Pulmonary effort is normal.  Musculoskeletal: Normal range of motion.  Skin:    General: Skin is warm and dry.  Neurological:     Mental Status: She is alert and oriented to person, place, and time.  Psychiatric:        Mood and Affect: Mood normal.        Behavior: Behavior normal.     RECENT LABS AND TESTS: BMET    Component Value Date/Time   NA 140 03/19/2019 1035   K 4.7 03/19/2019 1035   CL 102 03/19/2019 1035   CO2 22  03/19/2019 1035   GLUCOSE 80 03/19/2019 1035   BUN 8 03/19/2019 1035   CREATININE 0.97 03/19/2019 1035   CALCIUM 8.5 (L) 03/19/2019 1035   GFRNONAA 69 03/19/2019 1035   GFRAA 80 03/19/2019 1035   Lab Results  Component Value Date   HGBA1C 5.8 (H) 03/19/2019   HGBA1C 5.8 (H) 10/13/2018   HGBA1C 5.8 (H) 05/14/2018   Lab Results  Component Value Date   INSULIN 17.7 03/19/2019   INSULIN 17.4 10/13/2018   INSULIN 18.3 05/14/2018   CBC    Component Value Date/Time   WBC 8.7 05/14/2018 1138   WBC 13.7 (H) 09/09/2007 0546   RBC 4.96 05/14/2018 1138   RBC 3.11 (L) 09/09/2007 0546   HGB 12.0 05/14/2018 1138   HCT 38.8 05/14/2018 1138   PLT 197 09/09/2007 0546   MCV 78 (L) 05/14/2018 1138   MCH 24.2 (L) 05/14/2018 1138   MCHC 30.9 (L) 05/14/2018 1138   MCHC 33.6 09/09/2007 0546   RDW 16.7 (H) 05/14/2018 1138   LYMPHSABS 1.8 05/14/2018 1138   EOSABS 0.1 05/14/2018 1138   BASOSABS 0.0 05/14/2018 1138   Iron/TIBC/Ferritin/ %Sat No results found for: IRON, TIBC, FERRITIN, IRONPCTSAT Lipid Panel     Component Value Date/Time   CHOL 170 03/19/2019 1035   TRIG 157 (H) 03/19/2019 1035   HDL 34  (L) 03/19/2019 1035   LDLCALC 105 (H) 03/19/2019 1035   Hepatic Function Panel     Component Value Date/Time   PROT 6.5 03/19/2019 1035   ALBUMIN 3.8 03/19/2019 1035   AST 10 03/19/2019 1035   ALT 6 03/19/2019 1035   ALKPHOS 72 03/19/2019 1035   BILITOT 0.2 03/19/2019 1035      Component Value Date/Time   TSH 3.300 03/19/2019 1035   TSH 2.830 05/14/2018 1138     Ref. Range 03/19/2019 10:35  Vitamin D, 25-Hydroxy Latest Ref Range: 30.0 - 100.0 ng/mL 54.2    OBESITY BEHAVIORAL INTERVENTION VISIT  [name]'s visit was # 24   Starting weight: 242 lbs Starting date: 05/14/2018 [name]'s weight : 239 lbs [name]'s date: 08/27/2019 Total lbs lost to date: 3    08/27/2019  Height 5\' 7"  (1.702 m)  Weight 239 lb (108.4 kg)  BMI (Calculated) 37.42  BLOOD PRESSURE - SYSTOLIC 126  BLOOD PRESSURE - DIASTOLIC 85   Body Fat % 39.8 %  Total Body Water (lbs) 92.6 lbs    ASK: We discussed the diagnosis of obesity with Katelyn [name] and Katelyn [name] to give [name] permission to discuss obesity behavioral modification therapy [name].  ASSESS: Katelyn [name] has the diagnosis of obesity and her BMI [name] is 37.42 Katelyn [name]   ADVISE: Katelyn [name] was educated on the multiple health risks of obesity as well as the benefit of weight loss to improve her health. She was advised of the need for long term treatment and the importance of lifestyle modifications to improve her current health and to decrease her risk of future health problems.  AGREE: Multiple dietary modification options and treatment options were discussed and  Katelyn [name] to follow the recommendations documented in the above note.  ARRANGE: Katelyn [name] was educated on the importance of frequent visits to treat obesity as outlined per CMS and USPSTF guidelines and [name] to schedule her next follow up appointment [name].  I, Katelyn [name], am acting as transcriptionist for Katelyn Quince, MD  I have  reviewed the above documentation for accuracy and completeness, and I agree  with the above. -Katelyn Nip, MD

## 2019-09-08 ENCOUNTER — Encounter (INDEPENDENT_AMBULATORY_CARE_PROVIDER_SITE_OTHER): Payer: Self-pay | Admitting: Family Medicine

## 2019-09-14 ENCOUNTER — Encounter (INDEPENDENT_AMBULATORY_CARE_PROVIDER_SITE_OTHER): Payer: Self-pay | Admitting: Family Medicine

## 2019-09-14 ENCOUNTER — Other Ambulatory Visit: Payer: Self-pay

## 2019-09-14 ENCOUNTER — Ambulatory Visit (INDEPENDENT_AMBULATORY_CARE_PROVIDER_SITE_OTHER): Payer: BC Managed Care – PPO | Admitting: Family Medicine

## 2019-09-14 VITALS — BP 117/71 | HR 78 | Temp 98.3°F | Ht 67.0 in | Wt 229.0 lb

## 2019-09-14 DIAGNOSIS — E559 Vitamin D deficiency, unspecified: Secondary | ICD-10-CM | POA: Diagnosis not present

## 2019-09-14 DIAGNOSIS — E7849 Other hyperlipidemia: Secondary | ICD-10-CM | POA: Diagnosis not present

## 2019-09-14 DIAGNOSIS — Z9189 Other specified personal risk factors, not elsewhere classified: Secondary | ICD-10-CM | POA: Diagnosis not present

## 2019-09-14 DIAGNOSIS — Z6835 Body mass index (BMI) 35.0-35.9, adult: Secondary | ICD-10-CM

## 2019-09-14 DIAGNOSIS — R7303 Prediabetes: Secondary | ICD-10-CM

## 2019-09-14 MED ORDER — RYBELSUS 3 MG PO TABS: 3.0000 mg | ORAL_TABLET | Freq: Every day | ORAL | 0 refills | Status: DC

## 2019-09-14 NOTE — Progress Notes (Deleted)
5

## 2019-09-15 LAB — COMPREHENSIVE METABOLIC PANEL
ALT: 7 IU/L (ref 0–32)
AST: 10 IU/L (ref 0–40)
Albumin/Globulin Ratio: 1.4 (ref 1.2–2.2)
Albumin: 4 g/dL (ref 3.8–4.8)
Alkaline Phosphatase: 92 IU/L (ref 39–117)
BUN/Creatinine Ratio: 8 — ABNORMAL LOW (ref 9–23)
BUN: 9 mg/dL (ref 6–24)
Bilirubin Total: <0.2 mg/dL (ref 0.0–1.2)
CO2: 22 mmol/L (ref 20–29)
Calcium: 9.1 mg/dL (ref 8.7–10.2)
Chloride: 100 mmol/L (ref 96–106)
Creatinine, Ser: 1.06 mg/dL — ABNORMAL HIGH (ref 0.57–1.00)
GFR calc Af Amer: 71 mL/min/{1.73_m2} (ref >59)
GFR calc non Af Amer: 62 mL/min/{1.73_m2} (ref >59)
Globulin, Total: 2.9 g/dL (ref 1.5–4.5)
Glucose: 88 mg/dL (ref 65–99)
Potassium: 4 mmol/L (ref 3.5–5.2)
Sodium: 140 mmol/L (ref 134–144)
Total Protein: 6.9 g/dL (ref 6.0–8.5)

## 2019-09-15 LAB — LIPID PANEL WITH LDL/HDL RATIO
Cholesterol, Total: 176 mg/dL (ref 100–199)
HDL: 47 mg/dL (ref >39)
LDL Chol Calc (NIH): 105 mg/dL — ABNORMAL HIGH (ref 0–99)
LDL/HDL Ratio: 2.2 ratio (ref 0.0–3.2)
Triglycerides: 138 mg/dL (ref 0–149)
VLDL Cholesterol Cal: 24 mg/dL (ref 5–40)

## 2019-09-15 LAB — VITAMIN D 25 HYDROXY (VIT D DEFICIENCY, FRACTURES): Vit D, 25-Hydroxy: 26.4 ng/mL — ABNORMAL LOW (ref 30.0–100.0)

## 2019-09-15 LAB — TSH: TSH: 1.63 u[IU]/mL (ref 0.450–4.500)

## 2019-09-15 LAB — HEMOGLOBIN A1C
Est. average glucose Bld gHb Est-mCnc: 114 mg/dL
Hgb A1c MFr Bld: 5.6 % (ref 4.8–5.6)

## 2019-09-15 LAB — INSULIN, RANDOM: INSULIN: 14.9 u[IU]/mL (ref 2.6–24.9)

## 2019-09-15 LAB — T3: T3, Total: 136 ng/dL (ref 71–180)

## 2019-09-15 LAB — T4, FREE: Free T4: 1.44 ng/dL (ref 0.82–1.77)

## 2019-09-15 NOTE — Progress Notes (Signed)
Office: [phone]  /  Fax: [phone]   HPI:   Chief Complaint: OBESITY Katelyn Walker is here to discuss her progress with her obesity treatment plan. She is on the keep a food journal with 1200 calories and 80 grams of protein daily plan and she is following her eating plan approximately 50 % of the time. She states she is exercising at the gym 30 minutes 1 time per week. Katelyn Walker has done very well with weight loss, but unfortunately it is due to nausea she had with Rybelsus and the inability to eat much food. Her weight is 229 lb (103.9 kg) today and has had a weight loss of 10 pounds over a period of 2 weeks since her last visit. She has lost 13 lbs since starting treatment with [name].  Pre-Diabetes Katelyn Walker has a diagnosis of prediabetes based on her elevated Hgb A1c and was informed this puts her at greater risk of developing diabetes. Katelyn Walker started taking Rybelsus and she had significant nausea and vomiting for the first four days. She states the vomiting has resolved and nausea is a little better, but is still present. Katelyn Walker continues to work on diet and exercise to decrease risk of diabetes.   At risk for diabetes Katelyn Walker is at higher than average risk for developing diabetes due to her obesity and prediabetes. She currently denies polyuria or polydipsia.  Hyperlipidemia Katelyn Walker has hyperlipidemia and she has been working on her cholesterol levels with intensive lifestyle modification including a low saturated fat diet, exercise and weight loss. She denies any chest pain or myalgias.  Vitamin D deficiency Katelyn Walker has a diagnosis of vitamin D deficiency. Katelyn Walker is stable on vitamin D, and she has been at goal. She denies nausea, vomiting or muscle weakness. Katelyn Walker is due for labs.  ASSESSMENT AND PLAN:  Prediabetes - Plan: Comprehensive metabolic panel, Hemoglobin A1c, Insulin, random, Semaglutide (RYBELSUS) 3 MG TABS  Other hyperlipidemia - Plan: Lipid Panel With LDL/HDL Ratio,  T3, T4, free, TSH  Vitamin D deficiency - Plan: VITAMIN D 25 Hydroxy (Vit-D Deficiency, Fractures)  At risk for diabetes mellitus  Class 2 severe obesity with serious comorbidity and body mass index (BMI) of 35.0 to 35.9 in adult, unspecified obesity type [facility])  PLAN:  Pre-Diabetes Katelyn Walker will continue to work on weight loss, exercise, and decreasing simple carbohydrates in her diet to help decrease the risk of diabetes. We dicussed metformin including benefits and risks. She was informed that eating too many simple carbohydrates or too many calories at one sitting increases the likelihood of GI side effects. Jazzmen agrees to continue Rybelsus 3 mg #30 with 0 refills and patient is to eat 15 minutes after taking the medication (vs. 30 minutes) and we will follow. Katelyn Walker agrees to follow up with [name] as directed to monitor her progress.  Diabetes risk counseling Katelyn Walker was given extended (15 minutes) diabetes prevention counseling today. She is 48 y.o. female and has risk factors for diabetes including obesity and prediabetes. We discussed intensive lifestyle modifications today with an emphasis on weight loss as well as increasing exercise and decreasing simple carbohydrates in her diet.  Hyperlipidemia Katelyn Walker was informed of the American Heart Association Guidelines emphasizing intensive lifestyle modifications as the first line treatment for hyperlipidemia. We discussed many lifestyle modifications today in depth, and Lam will continue to work on decreasing saturated fats such as fatty red meat, butter and many fried foods. She will also increase vegetables and lean protein in her diet and continue to work  on exercise and weight loss efforts. We will check labs and Katelyn SandhoffSusanne will follow up as directed.  Vitamin D Deficiency Katelyn SandhoffSusanne was informed that low vitamin D levels contributes to fatigue and are associated with obesity, breast, and colon cancer. Katelyn SandhoffSusanne will continue to take Vit D  @2 ,000 IU daily and she will follow up for routine testing of vitamin D, at least 2-3 times per year. She was informed of the risk of over-replacement of vitamin D and agrees to not increase her dose unless she discusses this with us first. We will check labs and follow.  Obesity Katelyn SandhoffSusanne is currently in the action stage of change. As such, her goal is to continue with weight loss efforts She has agreed to keep a food journal with 1200 calories and 80+ grams of protein daily Katelyn SandhoffSusanne has been instructed to work up to a goal of 150 minutes of combined cardio and strengthening exercise per week for weight loss and overall health benefits. We discussed the following Behavioral Modification Strategies today: increasing lean protein intake and increase H2O intake We will adjust Rybelsus and Premier protein shakes are okay if needed until her nausea improves.  Katelyn SandhoffSusanne has agreed to follow up with our clinic in 2 weeks. She was informed of the importance of frequent follow up visits to maximize her success with intensive lifestyle modifications for her multiple health conditions.  ALLERGIES: No Known Allergies  MEDICATIONS: Current Outpatient Medications on File Prior to Visit  Medication Sig Dispense Refill  . Cholecalciferol (VITAMIN D3) 50 MCG (2000 UT) TABS Take 2,000 Units by mouth daily. 30 tablet 0  . CONTRAVE 8-90 MG TB12 TAKE TWO TABLETS BY MOUTH TWICE A DAY 120 tablet 0  . levothyroxine (SYNTHROID, LEVOTHROID) 75 MCG tablet Take 75 mcg by mouth daily before breakfast.    . metFORMIN (GLUCOPHAGE) 500 MG tablet Take 1 tablet (500 mg total) by mouth 2 (two) times daily with a meal. 60 tablet 0  . Norethindrone Acetate-Ethinyl Estrad-FE (BLISOVI 24 FE) 1-20 MG-MCG(24) tablet Take 1 tablet by mouth daily.    Marland Kitchen. omeprazole (PRILOSEC) 40 MG capsule Take 40 mg by mouth daily.    . valsartan (DIOVAN) 80 MG tablet Take 80 mg by mouth daily.    Marland Kitchen. zolpidem (AMBIEN) 10 MG tablet Take 10 mg by mouth at  bedtime as needed for sleep.     No current facility-administered medications on file prior to visit.     PAST MEDICAL HISTORY: Past Medical History:  Diagnosis Date  . Back pain   . Dyspnea   . Elevated blood pressure reading without diagnosis of hypertension   . Esophageal ulcer   . GERD (gastroesophageal reflux disease)   . Hot flashes   . Hypothyroid   . Joint pain   . Sciatica, left side   . Vitamin B 12 deficiency   . Vitamin D deficiency     PAST SURGICAL HISTORY: Past Surgical History:  Procedure Laterality Date  . DILATION AND CURETTAGE OF UTERUS     2011   . OVARIAN CYST SURGERY     2009, 2013    SOCIAL HISTORY: Social History   Tobacco Use  . Smoking status: Never Smoker  . Smokeless tobacco: Never Used  Substance Use Topics  . Alcohol use: Not on file  . Drug use: Not on file    FAMILY HISTORY: Family History  Problem Relation Age of Onset  . Alcoholism Mother   . Eating disorder Mother   . Diabetes Father   .  Hypertension Father   . Sleep apnea Father     ROS: Review of Systems  Constitutional: Positive for weight loss.  Cardiovascular: Negative for chest pain.  Gastrointestinal: Positive for nausea and vomiting.  Genitourinary: Negative for frequency.  Musculoskeletal: Negative for myalgias.       Negative for muscle weakness  Endo/Heme/Allergies: Negative for polydipsia.    PHYSICAL EXAM: Blood pressure 117/71, pulse 78, temperature 98.3 F (36.8 C), height  (1.702 m), weight 229 lb (103.9 kg), SpO2 99 %. Body mass index is 35.87 kg/m. Physical Exam Vitals signs reviewed.  Constitutional:      Appearance: Normal appearance. She is well-developed. She is obese.  Cardiovascular:     Rate and Rhythm: Normal rate.  Pulmonary:     Effort: Pulmonary effort is normal.  Musculoskeletal: Normal range of motion.  Skin:    General: Skin is warm and dry.  Neurological:     Mental Status: She is alert and oriented to person, place,  and time.  Psychiatric:        Mood and Affect: Mood normal.        Behavior: Behavior normal.     RECENT LABS AND TESTS: BMET    Component Value Date/Time   NA 140 09/14/2019 1030   K 4.0 09/14/2019 1030   CL 100 09/14/2019 1030   CO2 22 09/14/2019 1030   GLUCOSE 88 09/14/2019 1030   BUN 9 09/14/2019 1030   CREATININE 1.06 (H) 09/14/2019 1030   CALCIUM 9.1 09/14/2019 1030   GFRNONAA 62 09/14/2019 1030   GFRAA 71 09/14/2019 1030   Lab Results  Component Value Date   HGBA1C 5.6 09/14/2019   HGBA1C 5.8 (H) 03/19/2019   HGBA1C 5.8 (H) 10/13/2018   HGBA1C 5.8 (H) 05/14/2018   Lab Results  Component Value Date   INSULIN 14.9 09/14/2019   INSULIN 17.7 03/19/2019   INSULIN 17.4 10/13/2018   INSULIN 18.3 05/14/2018   CBC    Component Value Date/Time   WBC 8.7 05/14/2018 1138   WBC 13.7 (H) 09/09/2007 0546   RBC 4.96 05/14/2018 1138   RBC 3.11 (L) 09/09/2007 0546   HGB 12.0 05/14/2018 1138   HCT 38.8 05/14/2018 1138   PLT 197 09/09/2007 0546   MCV 78 (L) 05/14/2018 1138   MCH 24.2 (L) 05/14/2018 1138   MCHC 30.9 (L) 05/14/2018 1138   MCHC 33.6 09/09/2007 0546   RDW 16.7 (H) 05/14/2018 1138   LYMPHSABS 1.8 05/14/2018 1138   EOSABS 0.1 05/14/2018 1138   BASOSABS 0.0 05/14/2018 1138   Iron/TIBC/Ferritin/ %Sat No results found for: IRON, TIBC, FERRITIN, IRONPCTSAT Lipid Panel     Component Value Date/Time   CHOL 176 09/14/2019 1030   TRIG 138 09/14/2019 1030   HDL 47 09/14/2019 1030   LDLCALC 105 (H) 09/14/2019 1030   Hepatic Function Panel     Component Value Date/Time   PROT 6.9 09/14/2019 1030   ALBUMIN 4.0 09/14/2019 1030   AST 10 09/14/2019 1030   ALT 7 09/14/2019 1030   ALKPHOS 92 09/14/2019 1030   BILITOT <0.2 09/14/2019 1030      Component Value Date/Time   TSH 1.630 09/14/2019 1030   TSH 3.300 03/19/2019 1035   TSH 2.830 05/14/2018 1138     Ref. Range 03/19/2019 10:35  Vitamin D, 25-Hydroxy Latest Ref Range: 30.0 - 100.0 ng/mL 54.2     OBESITY BEHAVIORAL INTERVENTION VISIT  Today's visit was # 25  Starting weight: 242 lbs Starting date: 05/14/2018 Today's  weight : 229 lbs Today's date: 09/14/2019 Total lbs lost to date: 13    09/14/2019  Height 5\' 7"  (1.702 m)  Weight 229 lb (103.9 kg)  BMI (Calculated) 35.86  BLOOD PRESSURE - SYSTOLIC 117  BLOOD PRESSURE - DIASTOLIC 71   Body Fat % 39.8 %  Total Body Water (lbs) 86.81 lbs    ASK: We discussed the diagnosis of obesity with today and Jennett agreed to give Katelyn Walker permission to discuss obesity behavioral modification therapy today.  ASSESS: Sam has the diagnosis of obesity and her BMI today is 35.86 Angellee is in the action stage of change   ADVISE: Tanishka was educated on the multiple health risks of obesity as well as the benefit of weight loss to improve her health. She was advised of the need for long term treatment and the importance of lifestyle modifications to improve her current health and to decrease her risk of future health problems.  AGREE: Multiple dietary modification options and treatment options were discussed and  Reginia agreed to follow the recommendations documented in the above note.  ARRANGE: Hodaya was educated on the importance of frequent visits to treat obesity as outlined per CMS and USPSTF guidelines and agreed to schedule her next follow up appointment today.  I, Katelyn Walker, am acting as transcriptionist for [place] Crane, MD  I have reviewed the above documentation for accuracy and completeness, and I agree with the above. -Quillian Quince, MD

## 2019-09-28 ENCOUNTER — Other Ambulatory Visit (INDEPENDENT_AMBULATORY_CARE_PROVIDER_SITE_OTHER): Payer: Self-pay | Admitting: Family Medicine

## 2019-09-29 ENCOUNTER — Encounter (INDEPENDENT_AMBULATORY_CARE_PROVIDER_SITE_OTHER): Payer: Self-pay | Admitting: Family Medicine

## 2019-09-29 NOTE — Telephone Encounter (Signed)
I didn't respond to this patient regarding [facility] automatic reorder.  Please advise.

## 2019-10-01 ENCOUNTER — Encounter (INDEPENDENT_AMBULATORY_CARE_PROVIDER_SITE_OTHER): Payer: Self-pay | Admitting: Family Medicine

## 2019-10-01 ENCOUNTER — Other Ambulatory Visit: Payer: Self-pay

## 2019-10-01 ENCOUNTER — Ambulatory Visit (INDEPENDENT_AMBULATORY_CARE_PROVIDER_SITE_OTHER): Payer: BC Managed Care – PPO | Admitting: Family Medicine

## 2019-10-01 VITALS — BP 124/83 | HR 82 | Temp 98.1°F | Ht 67.0 in | Wt 230.0 lb

## 2019-10-01 DIAGNOSIS — Z9189 Other specified personal risk factors, not elsewhere classified: Secondary | ICD-10-CM

## 2019-10-01 DIAGNOSIS — Z6836 Body mass index (BMI) 36.0-36.9, adult: Secondary | ICD-10-CM

## 2019-10-01 DIAGNOSIS — R7303 Prediabetes: Secondary | ICD-10-CM | POA: Diagnosis not present

## 2019-10-01 MED ORDER — METFORMIN HCL 500 MG PO TABS: 500.0000 mg | ORAL_TABLET | Freq: Two times a day (BID) | ORAL | 0 refills | Status: DC

## 2019-10-05 NOTE — Progress Notes (Signed)
Office: [phone]  /  Fax: [phone]   HPI:   Chief Complaint: OBESITY Katelyn Walker is here to discuss her progress with her obesity treatment plan. She is on the keep a food journal with 1200 calories and 80+ grams of protein daily and is following her eating plan approximately 50 % of the time. She states she is walking and at the gym for 30 minutes 1-2 times per week. Katelyn Walker is retaining some fluid today. Overall she has been doing better with her diet prescription. Her hunger is controlled and she is working on increasing vegetables and lean protein. She has started back with exercise as well.  Her weight is 230 lb (104.3 kg) today and has gained 1 lb since her last visit. She has lost 12 lbs since starting treatment with [name].  Pre-Diabetes Katelyn Walker has a diagnosis of pre-diabetes based on her elevated Hgb A1c and was informed this puts her at greater risk of developing diabetes. Her A1c is improving on medications and diet. She notes nausea with Rybelsus has improved with waiting 15 minutes after taking Rybelsus to eat instead of 30 minutes. She is taking metformin currently and continues to work on diet and exercise to decrease risk of diabetes.   At risk for diabetes Katelyn Walker is at higher than average risk for developing diabetes due to her obesity and pre-diabetes. She currently denies polyuria or polydipsia.  ASSESSMENT AND PLAN:  Prediabetes - Plan: metFORMIN (GLUCOPHAGE) 500 MG tablet  At risk for diabetes mellitus  Class 2 severe obesity with serious comorbidity and body mass index (BMI) of 36.0 to 36.9 in adult, unspecified obesity type [facility])  PLAN:  Pre-Diabetes Katelyn Walker will continue to work on weight loss, exercise, and decreasing simple carbohydrates in her diet to help decrease the risk of diabetes. We dicussed metformin including benefits and risks. She was informed that eating too many simple carbohydrates or too many calories at one sitting increases the likelihood  of GI side effects. Serin agrees to continue taking metformin 500 mg BID #60 and we will refill for 1 month, and she agrees to continue taking Rybelsus as is. Katelyn Walker agrees to follow up with our clinic in 2 to 3 weeks as directed to monitor her progress.  Diabetes risk counseling Katelyn Walker was given extended (30 minutes) diabetes prevention counseling today. She is 48 y.o. female and has risk factors for diabetes including obesity and pre-diabetes. We discussed intensive lifestyle modifications today with an emphasis on weight loss as well as increasing exercise and decreasing simple carbohydrates in her diet.  Obesity Katelyn Walker is currently in the action stage of change. As such, her goal is to continue with weight loss efforts She has agreed to keep a food journal with 1200 calories and 80+ grams of protein daily Katelyn Walker has been instructed to work up to a goal of 150 minutes of combined cardio and strengthening exercise per week for weight loss and overall health benefits. We discussed the following Behavioral Modification Strategies today: increasing lean protein intake and decreasing simple carbohydrates    Katelyn Walker has agreed to follow up with our clinic in 2 to 3 weeks. She was informed of the importance of frequent follow up visits to maximize her success with intensive lifestyle modifications for her multiple health conditions.  ALLERGIES: No Known Allergies  MEDICATIONS: Current Outpatient Medications on File Prior to Visit  Medication Sig Dispense Refill  . Cholecalciferol (VITAMIN D3) 50 MCG (2000 UT) TABS Take 2,000 Units by mouth daily. 30 tablet 0  .  CONTRAVE 8-90 MG TB12 TAKE TWO TABLETS BY MOUTH TWICE A DAY 120 tablet 0  . levothyroxine (SYNTHROID, LEVOTHROID) 75 MCG tablet Take 75 mcg by mouth daily before breakfast.    . Norethindrone Acetate-Ethinyl Estrad-FE (BLISOVI 24 FE) 1-20 MG-MCG(24) tablet Take 1 tablet by mouth daily.    Marland Kitchen omeprazole (PRILOSEC) 40 MG capsule Take  40 mg by mouth daily.    . Semaglutide (RYBELSUS) 3 MG TABS Take 3 mg by mouth daily. 30 tablet 0  . valsartan (DIOVAN) 80 MG tablet Take 80 mg by mouth daily.    Marland Kitchen zolpidem (AMBIEN) 10 MG tablet Take 10 mg by mouth at bedtime as needed for sleep.     No current facility-administered medications on file prior to visit.     PAST MEDICAL HISTORY: Past Medical History:  Diagnosis Date  . Back pain   . Dyspnea   . Elevated blood pressure reading without diagnosis of hypertension   . Esophageal ulcer   . GERD (gastroesophageal reflux disease)   . Hot flashes   . Hypothyroid   . Joint pain   . Sciatica, left side   . Vitamin B 12 deficiency   . Vitamin D deficiency     PAST SURGICAL HISTORY: Past Surgical History:  Procedure Laterality Date  . DILATION AND CURETTAGE OF UTERUS     2011   . OVARIAN CYST SURGERY     2009, 2013    SOCIAL HISTORY: Social History   Tobacco Use  . Smoking status: Never Smoker  . Smokeless tobacco: Never Used  Substance Use Topics  . Alcohol use: Not on file  . Drug use: Not on file    FAMILY HISTORY: Family History  Problem Relation Age of Onset  . Alcoholism Mother   . Eating disorder Mother   . Diabetes Father   . Hypertension Father   . Sleep apnea Father     ROS: Review of Systems  Constitutional: Negative for weight loss.  Genitourinary: Negative for frequency.  Endo/Heme/Allergies: Negative for polydipsia.    PHYSICAL EXAM: Blood pressure 124/83, pulse 82, temperature 98.1 F (36.7 C), temperature source Oral, height 5\' 7"  (1.702 m), weight 230 lb (104.3 kg), SpO2 98 %. Body mass index is 36.02 kg/m. Physical Exam Vitals signs reviewed.  Constitutional:      Appearance: Normal appearance. She is obese.  Cardiovascular:     Rate and Rhythm: Normal rate.     Pulses: Normal pulses.  Pulmonary:     Effort: Pulmonary effort is normal.     Breath sounds: Normal breath sounds.  Musculoskeletal: Normal range of motion.   Skin:    General: Skin is warm and dry.  Neurological:     Mental Status: She is alert and oriented to person, place, and time.  Psychiatric:        Mood and Affect: Mood normal.        Behavior: Behavior normal.     RECENT LABS AND TESTS: BMET    Component Value Date/Time   NA 140 09/14/2019 1030   K 4.0 09/14/2019 1030   CL 100 09/14/2019 1030   CO2 22 09/14/2019 1030   GLUCOSE 88 09/14/2019 1030   BUN 9 09/14/2019 1030   CREATININE 1.06 (H) 09/14/2019 1030   CALCIUM 9.1 09/14/2019 1030   GFRNONAA 62 09/14/2019 1030   GFRAA 71 09/14/2019 1030   Lab Results  Component Value Date   HGBA1C 5.6 09/14/2019   HGBA1C 5.8 (H) 03/19/2019   HGBA1C  5.8 (H) 10/13/2018   HGBA1C 5.8 (H) 05/14/2018   Lab Results  Component Value Date   INSULIN 14.9 09/14/2019   INSULIN 17.7 03/19/2019   INSULIN 17.4 10/13/2018   INSULIN 18.3 05/14/2018   CBC    Component Value Date/Time   WBC 8.7 05/14/2018 1138   WBC 13.7 (H) 09/09/2007 0546   RBC 4.96 05/14/2018 1138   RBC 3.11 (L) 09/09/2007 0546   HGB 12.0 05/14/2018 1138   HCT 38.8 05/14/2018 1138   PLT 197 09/09/2007 0546   MCV 78 (L) 05/14/2018 1138   MCH 24.2 (L) 05/14/2018 1138   MCHC 30.9 (L) 05/14/2018 1138   MCHC 33.6 09/09/2007 0546   RDW 16.7 (H) 05/14/2018 1138   LYMPHSABS 1.8 05/14/2018 1138   EOSABS 0.1 05/14/2018 1138   BASOSABS 0.0 05/14/2018 1138   Iron/TIBC/Ferritin/ %Sat No results found for: IRON, TIBC, FERRITIN, IRONPCTSAT Lipid Panel     Component Value Date/Time   CHOL 176 09/14/2019 1030   TRIG 138 09/14/2019 1030   HDL 47 09/14/2019 1030   LDLCALC 105 (H) 09/14/2019 1030   Hepatic Function Panel     Component Value Date/Time   PROT 6.9 09/14/2019 1030   ALBUMIN 4.0 09/14/2019 1030   AST 10 09/14/2019 1030   ALT 7 09/14/2019 1030   ALKPHOS 92 09/14/2019 1030   BILITOT <0.2 09/14/2019 1030      Component Value Date/Time   TSH 1.630 09/14/2019 1030   TSH 3.300 03/19/2019 1035   TSH 2.830  05/14/2018 1138      OBESITY BEHAVIORAL INTERVENTION VISIT  Today's visit was # 26   Starting weight: 242 lbs Starting date: 05/14/18 Today's weight : 230 lbs Today's date: 10/01/2019 Total lbs lost to date: 12    ASK: We discussed the diagnosis of obesity with Katelyn Walker today and Katelyn Walker agreed to give us permission to discuss obesity behavioral modification therapy today.  ASSESS: Katelyn Walker has the diagnosis of obesity and her BMI today is 36.01 Katelyn Walker is in the action stage of change   ADVISE: Katelyn Walker was educated on the multiple health risks of obesity as well as the benefit of weight loss to improve her health. She was advised of the need for long term treatment and the importance of lifestyle modifications to improve her current health and to decrease her risk of future health problems.  AGREE: Multiple dietary modification options and treatment options were discussed and  Katelyn Walker agreed to follow the recommendations documented in the above note.  ARRANGE: Katelyn Walker was educated on the importance of frequent visits to treat obesity as outlined per CMS and USPSTF guidelines and agreed to schedule her next follow up appointment today.  I, Burt KnackSharon Martin, am acting as transcriptionist for Quillian Quincearen Beasley, MD  I have reviewed the above documentation for accuracy and completeness, and I agree with the above. -Quillian Quincearen Beasley, MD

## 2019-10-22 ENCOUNTER — Ambulatory Visit (INDEPENDENT_AMBULATORY_CARE_PROVIDER_SITE_OTHER): Payer: BC Managed Care – PPO | Admitting: Family Medicine

## 2019-10-22 ENCOUNTER — Other Ambulatory Visit: Payer: Self-pay

## 2019-10-22 ENCOUNTER — Encounter (INDEPENDENT_AMBULATORY_CARE_PROVIDER_SITE_OTHER): Payer: Self-pay | Admitting: Family Medicine

## 2019-10-22 VITALS — BP 111/73 | HR 84 | Temp 98.2°F | Ht 67.0 in | Wt 231.0 lb

## 2019-10-22 DIAGNOSIS — Z6836 Body mass index (BMI) 36.0-36.9, adult: Secondary | ICD-10-CM | POA: Diagnosis not present

## 2019-10-22 DIAGNOSIS — R7303 Prediabetes: Secondary | ICD-10-CM | POA: Diagnosis not present

## 2019-10-22 DIAGNOSIS — Z9189 Other specified personal risk factors, not elsewhere classified: Secondary | ICD-10-CM

## 2019-10-22 MED ORDER — RYBELSUS 3 MG PO TABS: 3.0000 mg | ORAL_TABLET | Freq: Every day | ORAL | 0 refills | Status: DC

## 2019-10-22 NOTE — Progress Notes (Signed)
Office: [phone]  /  Fax: [phone]   HPI:   Chief Complaint: OBESITY Katelyn Walker is here to discuss her progress with her obesity treatment plan. She is keeping a food journal with 1200 calories and 80 grams of protein and is following her eating plan approximately 90% of the time. She states she is exercising on the treadmill 30 minutes 2 times per week. Katelyn Walker is working on her diet and journaling. She is struggling to eat all of her protein. She is getting bored with her food choices and would like more ideas. Her weight is 231 lb (104.8 kg) today and has had a weight gain of 1 lb since her last visit. She has lost 11 lbs since starting treatment with [name].  Pre-Diabetes Katelyn Walker has a diagnosis of prediabetes based on her elevated Hgb A1c and was informed this puts her at greater risk of developing diabetes. She notes decreased GI upset on Rybelsus when she eats 15 minutes after taking it. She continues to work on diet and exercise to decrease risk of diabetes. She denies nausea or hypoglycemia.  At risk for diabetes Katelyn Walker is at higher than average risk for developing diabetes due to her obesity. She currently denies polyuria or polydipsia.  ASSESSMENT AND PLAN:  Prediabetes - Plan: Semaglutide (RYBELSUS) 3 MG TABS  At risk for diabetes mellitus  Class 2 severe obesity with serious comorbidity and body mass index (BMI) of 36.0 to 36.9 in adult, unspecified obesity type Katelyn Walker)  PLAN:  Pre-Diabetes Katelyn Walker will continue to work on weight loss, exercise, and decreasing simple carbohydrates in her diet to help decrease the risk of diabetes. We dicussed metformin including benefits and risks. She was informed that eating too many simple carbohydrates or too many calories at one sitting increases the likelihood of GI side effects. Katelyn Walker was given a refill on her Rybelsus 3 mg #30 with 0 refills and agrees to follow-up with our clinic in 2 weeks.  Diabetes risk counseling Katelyn Walker  was given extended (15 minutes) diabetes prevention counseling today. She is 48 y.o. female and has risk factors for diabetes including obesity. We discussed intensive lifestyle modifications today with an emphasis on weight loss as well as increasing exercise and decreasing simple carbohydrates in her diet.  Obesity Katelyn Walker is currently in the action stage of change. As such, her goal is to continue with weight loss efforts. She has Walker to keep a food journal with 1200 calories and 80+ grams of protein. Katelyn Walker was given shredded chicken recipes.  Katelyn Walker has been instructed to work up to a goal of 150 minutes of combined cardio and strengthening exercise per week for weight loss and overall health benefits. We discussed the following Behavioral Modification Strategies today: increasing lean protein intake and decreasing simple carbohydrates.  Katelyn Walker has Walker to follow-up with our clinic in 2 weeks. She was informed of the importance of frequent follow-up visits to maximize her success with intensive lifestyle modifications for her multiple health conditions.  ALLERGIES: No Known Allergies  MEDICATIONS: Current Outpatient Medications on File Prior to Visit  Medication Sig Dispense Refill  . Cholecalciferol (VITAMIN D3) 50 MCG (2000 UT) TABS Take 2,000 Units by mouth daily. 30 tablet 0  . CONTRAVE 8-90 MG TB12 TAKE TWO TABLETS BY MOUTH TWICE A DAY 120 tablet 0  . levothyroxine (SYNTHROID, LEVOTHROID) 75 MCG tablet Take 75 mcg by mouth daily before breakfast.    . metFORMIN (GLUCOPHAGE) 500 MG tablet Take 1 tablet (500 mg total) by mouth  2 (two) times daily with a meal. 60 tablet 0  . Norethindrone Acetate-Ethinyl Estrad-FE (BLISOVI 24 FE) 1-20 MG-MCG(24) tablet Take 1 tablet by mouth daily.    Marland Kitchen omeprazole (PRILOSEC) 40 MG capsule Take 40 mg by mouth daily.    . valsartan (DIOVAN) 80 MG tablet Take 80 mg by mouth daily.    Marland Kitchen zolpidem (AMBIEN) 10 MG tablet Take 10 mg by mouth at bedtime  as needed for sleep.     No current facility-administered medications on file prior to visit.     PAST MEDICAL HISTORY: Past Medical History:  Diagnosis Date  . Back pain   . Dyspnea   . Elevated blood pressure reading without diagnosis of hypertension   . Esophageal ulcer   . GERD (gastroesophageal reflux disease)   . Hot flashes   . Hypothyroid   . Joint pain   . Sciatica, left side   . Vitamin B 12 deficiency   . Vitamin D deficiency     PAST SURGICAL HISTORY: Past Surgical History:  Procedure Laterality Date  . DILATION AND CURETTAGE OF UTERUS     2011   . OVARIAN CYST SURGERY     2009, 2013    SOCIAL HISTORY: Social History   Tobacco Use  . Smoking status: Never Smoker  . Smokeless tobacco: Never Used  Substance Use Topics  . Alcohol use: Not on file  . Drug use: Not on file    FAMILY HISTORY: Family History  Problem Relation Age of Onset  . Alcoholism Mother   . Eating disorder Mother   . Diabetes Father   . Hypertension Father   . Sleep apnea Father    ROS: Review of Systems  Gastrointestinal: Negative for nausea.  Endo/Heme/Allergies:       Negative for hypoglycemia.   PHYSICAL EXAM: Blood pressure 111/73, pulse 84, temperature 98.2 F (36.8 C), temperature source Oral, height 5\' 7"  (1.702 m), weight 231 lb (104.8 kg), SpO2 99 %. Body mass index is 36.18 kg/m. Physical Exam Vitals signs reviewed.  Constitutional:      Appearance: Normal appearance. She is obese.  Cardiovascular:     Rate and Rhythm: Normal rate.     Pulses: Normal pulses.  Pulmonary:     Effort: Pulmonary effort is normal.     Breath sounds: Normal breath sounds.  Musculoskeletal: Normal range of motion.  Skin:    General: Skin is warm and dry.  Neurological:     Mental Status: She is alert and oriented to person, place, and time.  Psychiatric:        Behavior: Behavior normal.   RECENT LABS AND TESTS: BMET    Component Value Date/Time   NA 140 09/14/2019 1030    K 4.0 09/14/2019 1030   CL 100 09/14/2019 1030   CO2 22 09/14/2019 1030   GLUCOSE 88 09/14/2019 1030   BUN 9 09/14/2019 1030   CREATININE 1.06 (H) 09/14/2019 1030   CALCIUM 9.1 09/14/2019 1030   GFRNONAA 62 09/14/2019 1030   GFRAA 71 09/14/2019 1030   Lab Results  Component Value Date   HGBA1C 5.6 09/14/2019   HGBA1C 5.8 (H) 03/19/2019   HGBA1C 5.8 (H) 10/13/2018   HGBA1C 5.8 (H) 05/14/2018   Lab Results  Component Value Date   INSULIN 14.9 09/14/2019   INSULIN 17.7 03/19/2019   INSULIN 17.4 10/13/2018   INSULIN 18.3 05/14/2018   CBC    Component Value Date/Time   WBC 8.7 05/14/2018 1138   WBC  13.7 (H) 09/09/2007 0546   RBC 4.96 05/14/2018 1138   RBC 3.11 (L) 09/09/2007 0546   HGB 12.0 05/14/2018 1138   HCT 38.8 05/14/2018 1138   PLT 197 09/09/2007 0546   MCV 78 (L) 05/14/2018 1138   MCH 24.2 (L) 05/14/2018 1138   MCHC 30.9 (L) 05/14/2018 1138   MCHC 33.6 09/09/2007 0546   RDW 16.7 (H) 05/14/2018 1138   LYMPHSABS 1.8 05/14/2018 1138   EOSABS 0.1 05/14/2018 1138   BASOSABS 0.0 05/14/2018 1138   Iron/TIBC/Ferritin/ %Sat No results found for: IRON, TIBC, FERRITIN, IRONPCTSAT Lipid Panel     Component Value Date/Time   CHOL 176 09/14/2019 1030   TRIG 138 09/14/2019 1030   HDL 47 09/14/2019 1030   LDLCALC 105 (H) 09/14/2019 1030   Hepatic Function Panel     Component Value Date/Time   PROT 6.9 09/14/2019 1030   ALBUMIN 4.0 09/14/2019 1030   AST 10 09/14/2019 1030   ALT 7 09/14/2019 1030   ALKPHOS 92 09/14/2019 1030   BILITOT <0.2 09/14/2019 1030      Component Value Date/Time   TSH 1.630 09/14/2019 1030   TSH 3.300 03/19/2019 1035   TSH 2.830 05/14/2018 1138   Results for Katelyn BarreCRIHFIELD, Gerard G (MRN [id number]) as of 10/22/2019 12:00  Ref. Range 09/14/2019 10:30  Vitamin D, 25-Hydroxy Latest Ref Range: 30.0 - 100.0 ng/mL 26.4 (L)   OBESITY BEHAVIORAL INTERVENTION VISIT  Today's visit was #27   Starting weight: 242 lbs Starting date: 05/14/2018  Today's weight: 231 lbs  Today's date: 10/22/2019 Total lbs lost to date: 11    10/22/2019  Height 5\' 7"  (1.702 m)  Weight 231 lb (104.8 kg)  BMI (Calculated) 36.17  BLOOD PRESSURE - SYSTOLIC 111  BLOOD PRESSURE - DIASTOLIC 73   Body Fat % 40 %  Total Body Water (lbs) 90.4 lbs   ASK: We discussed the diagnosis of obesity with Katelyn Walker today and Katelyn Walker to give us permission to discuss obesity behavioral modification therapy today.  ASSESS: Katelyn Walker has the diagnosis of obesity and her BMI today is 36.2. Katelyn Walker is in the action stage of change.   ADVISE: Katelyn Walker was educated on the multiple health risks of obesity as well as the benefit of weight loss to improve her health. She was advised of the need for long term treatment and the importance of lifestyle modifications to improve her current health and to decrease her risk of future health problems.  AGREE: Multiple dietary modification options and treatment options were discussed and  Katelyn Walker to follow the recommendations documented in the above note.  ARRANGE: Katelyn Walker was educated on the importance of frequent visits to treat obesity as outlined per CMS and USPSTF guidelines and Walker to schedule her next follow up appointment today.  I, Marianna Paymentenise Haag, am acting as Energy managertranscriptionist for Quillian Quincearen Traveon Louro, MD I have reviewed the above documentation for accuracy and completeness, and I agree with the above. -Quillian Quincearen Carvin Almas, MD

## 2019-11-05 ENCOUNTER — Ambulatory Visit (INDEPENDENT_AMBULATORY_CARE_PROVIDER_SITE_OTHER): Payer: BC Managed Care – PPO | Admitting: Family Medicine

## 2019-11-09 ENCOUNTER — Other Ambulatory Visit: Payer: Self-pay

## 2019-11-09 ENCOUNTER — Encounter (INDEPENDENT_AMBULATORY_CARE_PROVIDER_SITE_OTHER): Payer: Self-pay | Admitting: Family Medicine

## 2019-11-09 ENCOUNTER — Ambulatory Visit (INDEPENDENT_AMBULATORY_CARE_PROVIDER_SITE_OTHER): Payer: BC Managed Care – PPO | Admitting: Family Medicine

## 2019-11-09 VITALS — BP 124/79 | HR 82 | Temp 97.9°F | Ht 67.0 in | Wt 229.0 lb

## 2019-11-09 DIAGNOSIS — E038 Other specified hypothyroidism: Secondary | ICD-10-CM

## 2019-11-09 DIAGNOSIS — Z6835 Body mass index (BMI) 35.0-35.9, adult: Secondary | ICD-10-CM

## 2019-11-09 DIAGNOSIS — E559 Vitamin D deficiency, unspecified: Secondary | ICD-10-CM | POA: Diagnosis not present

## 2019-11-09 DIAGNOSIS — R7303 Prediabetes: Secondary | ICD-10-CM | POA: Diagnosis not present

## 2019-11-09 DIAGNOSIS — Z9189 Other specified personal risk factors, not elsewhere classified: Secondary | ICD-10-CM

## 2019-11-09 MED ORDER — RYBELSUS 3 MG PO TABS: 3.0000 mg | ORAL_TABLET | Freq: Every day | ORAL | 0 refills | Status: DC

## 2019-11-10 ENCOUNTER — Encounter (INDEPENDENT_AMBULATORY_CARE_PROVIDER_SITE_OTHER): Payer: Self-pay | Admitting: Family Medicine

## 2019-11-10 NOTE — Progress Notes (Signed)
Office: [phone]  /  Fax: [phone]   HPI:   Chief Complaint: OBESITY Katelyn Walker is here to discuss her progress with her obesity treatment plan. She is keeping a food journal with 1200 calories and 80+ grams of protein and is following her eating plan approximately 90% of the time. She states she is walking 60 minutes 2 times per week. Katelyn Walker is on Contrave, metformin, Rybelsus combination helping with weight loss and is still having some nausea after taking Rybelsus. She has been working on getting to the 30 minute mark prior to eating. Her weight is 229 lb (103.9 kg) today and has had a weight loss of 2 pounds over a period of 3 weeks since her last visit. She has lost 13 lbs since starting treatment with [name].  Pre-Diabetes Katelyn Walker has a diagnosis of prediabetes based on her elevated Hgb A1c and was informed this puts her at greater risk of developing diabetes. She is taking metformin and Rybelsus and will increase Rybelsus once she has no more nausea. She denies nausea or hypoglycemia.  Vitamin D deficiency Katelyn Walker has a diagnosis of Vitamin D deficiency. She is currently taking OTC Vit D supplementation and denies nausea, vomiting or muscle weakness.  At risk for osteopenia and osteoporosis Katelyn Walker is at higher risk of osteopenia and osteoporosis due to Vitamin D deficiency.   Hypothyroidism Katelyn Walker has a diagnosis of hypothyroidism. Levothyroxine was started for weight loss, but with normal labs. She denies hot or cold intolerance or palpitations, but does admit to ongoing fatigue.   ASSESSMENT AND PLAN:  Prediabetes - Plan: Semaglutide (RYBELSUS) 3 MG TABS  Vitamin D deficiency  Other specified hypothyroidism  At risk for osteoporosis  Class 2 severe obesity with serious comorbidity and body mass index (BMI) of 35.0 to 35.9 in adult, unspecified obesity type [facility])  PLAN:  Pre-Diabetes Katelyn Walker will continue to work on weight loss, exercise, and decreasing simple  carbohydrates in her diet to help decrease the risk of diabetes. We dicussed metformin including benefits and risks. She was informed that eating too many simple carbohydrates or too many calories at one sitting increases the likelihood of GI side effects. Katelyn Walker was given a refill on Rybelsus and will follow-up as directed.  Vitamin D Deficiency Katelyn Walker was informed that low Vitamin D levels contributes to fatigue and are associated with obesity, breast, and colon cancer. She agrees to continue taking OTC Vit D and will follow-up for routine testing of Vitamin D, at least 2-3 times per year. She was informed of the risk of over-replacement of Vitamin D and agrees to not increase her dose unless she discusses this with [name] first. Katelyn Walker agrees to follow-up with our clinic in 2 weeks.  At risk for osteopenia and osteoporosis Katelyn Walker was given extended  (15 minutes) osteoporosis prevention counseling today. Katelyn Walker is at risk for osteopenia and osteoporosis due to her Vitamin D deficiency. She was encouraged to take her Vitamin D and follow her higher calcium diet and increase strengthening exercise to help strengthen her bones and decrease her risk of osteopenia and osteoporosis.  Hypothyroidism Katelyn Walker was informed of the importance of good thyroid control to help with weight loss efforts. She was also informed that supertheraputic thyroid levels are dangerous and will not improve weight loss results. Katelyn Walker was instructed to hold levothyroxine and will have labs rechecked in 6 weeks.  Obesity Katelyn Walker is currently in the action stage of change. As such, her goal is to continue with weight loss efforts. She  has agreed to keep a food journal with 1200 calories and 80+ grams of protein. Katelyn Walker has been instructed to exercise as tolerated for weight loss and overall health benefits. We discussed the following Behavioral Modification Strategies today: increasing lean protein intake, decreasing simple  carbohydrates, increasing vegetables, increase H20 intake, work on meal planning and easy cooking plans, and holiday eating strategies.  Katelyn Walker has agreed to follow-up with our clinic in 2 weeks. She was informed of the importance of frequent follow-up visits to maximize her success with intensive lifestyle modifications for her multiple health conditions.  ALLERGIES: No Known Allergies  MEDICATIONS: Current Outpatient Medications on File Prior to Visit  Medication Sig Dispense Refill  . Cholecalciferol (VITAMIN D3) 50 MCG (2000 UT) TABS Take 2,000 Units by mouth daily. 30 tablet 0  . CONTRAVE 8-90 MG TB12 TAKE TWO TABLETS BY MOUTH TWICE A DAY 120 tablet 0  . levothyroxine (SYNTHROID, LEVOTHROID) 75 MCG tablet Take 75 mcg by mouth daily before breakfast.    . metFORMIN (GLUCOPHAGE) 500 MG tablet Take 1 tablet (500 mg total) by mouth 2 (two) times daily with a meal. 60 tablet 0  . Norethindrone Acetate-Ethinyl Estrad-FE (BLISOVI 24 FE) 1-20 MG-MCG(24) tablet Take 1 tablet by mouth daily.    Marland Kitchen omeprazole (PRILOSEC) 40 MG capsule Take 40 mg by mouth daily.    . valsartan (DIOVAN) 80 MG tablet Take 80 mg by mouth daily.    Marland Kitchen zolpidem (AMBIEN) 10 MG tablet Take 10 mg by mouth at bedtime as needed for sleep.     No current facility-administered medications on file prior to visit.     PAST MEDICAL HISTORY: Past Medical History:  Diagnosis Date  . Back pain   . Dyspnea   . Elevated blood pressure reading without diagnosis of hypertension   . Esophageal ulcer   . GERD (gastroesophageal reflux disease)   . Hot flashes   . Hypothyroid   . Joint pain   . Sciatica, left side   . Vitamin B 12 deficiency   . Vitamin D deficiency     PAST SURGICAL HISTORY: Past Surgical History:  Procedure Laterality Date  . DILATION AND CURETTAGE OF UTERUS     2011   . OVARIAN CYST SURGERY     2009, 2013    SOCIAL HISTORY: Social History   Tobacco Use  . Smoking status: Never Smoker  . Smokeless  tobacco: Never Used  Substance Use Topics  . Alcohol use: Not on file  . Drug use: Not on file    FAMILY HISTORY: Family History  Problem Relation Age of Onset  . Alcoholism Mother   . Eating disorder Mother   . Diabetes Father   . Hypertension Father   . Sleep apnea Father    ROS: Review of Systems  Constitutional: Positive for malaise/fatigue.  Cardiovascular: Negative for palpitations.  Gastrointestinal: Negative for nausea and vomiting.  Musculoskeletal:       Negative for muscle weakness.  Endo/Heme/Allergies:       Negative for hypoglycemia. Negative for hot/cold intolerance.   PHYSICAL EXAM: Blood pressure 124/79, pulse 82, temperature 97.9 F (36.6 C), temperature source Oral, height 5\' 7"  (1.702 m), weight 229 lb (103.9 kg), SpO2 100 %. Body mass index is 35.87 kg/m. Physical Exam Vitals signs reviewed.  Constitutional:      Appearance: Normal appearance. She is obese.  Cardiovascular:     Rate and Rhythm: Normal rate.     Pulses: Normal pulses.  Pulmonary:  Effort: Pulmonary effort is normal.     Breath sounds: Normal breath sounds.  Musculoskeletal: Normal range of motion.  Skin:    General: Skin is warm and dry.  Neurological:     Mental Status: She is alert and oriented to person, place, and time.  Psychiatric:        Behavior: Behavior normal.   RECENT LABS AND TESTS: BMET    Component Value Date/Time   NA 140 09/14/2019 1030   K 4.0 09/14/2019 1030   CL 100 09/14/2019 1030   CO2 22 09/14/2019 1030   GLUCOSE 88 09/14/2019 1030   BUN 9 09/14/2019 1030   CREATININE 1.06 (H) 09/14/2019 1030   CALCIUM 9.1 09/14/2019 1030   GFRNONAA 62 09/14/2019 1030   GFRAA 71 09/14/2019 1030   Lab Results  Component Value Date   HGBA1C 5.6 09/14/2019   HGBA1C 5.8 (H) 03/19/2019   HGBA1C 5.8 (H) 10/13/2018   HGBA1C 5.8 (H) 05/14/2018   Lab Results  Component Value Date   INSULIN 14.9 09/14/2019   INSULIN 17.7 03/19/2019   INSULIN 17.4 10/13/2018    INSULIN 18.3 05/14/2018   CBC    Component Value Date/Time   WBC 8.7 05/14/2018 1138   WBC 13.7 (H) 09/09/2007 0546   RBC 4.96 05/14/2018 1138   RBC 3.11 (L) 09/09/2007 0546   HGB 12.0 05/14/2018 1138   HCT 38.8 05/14/2018 1138   PLT 197 09/09/2007 0546   MCV 78 (L) 05/14/2018 1138   MCH 24.2 (L) 05/14/2018 1138   MCHC 30.9 (L) 05/14/2018 1138   MCHC 33.6 09/09/2007 0546   RDW 16.7 (H) 05/14/2018 1138   LYMPHSABS 1.8 05/14/2018 1138   EOSABS 0.1 05/14/2018 1138   BASOSABS 0.0 05/14/2018 1138   Iron/TIBC/Ferritin/ %Sat No results found for: IRON, TIBC, FERRITIN, IRONPCTSAT Lipid Panel     Component Value Date/Time   CHOL 176 09/14/2019 1030   TRIG 138 09/14/2019 1030   HDL 47 09/14/2019 1030   LDLCALC 105 (H) 09/14/2019 1030   Hepatic Function Panel     Component Value Date/Time   PROT 6.9 09/14/2019 1030   ALBUMIN 4.0 09/14/2019 1030   AST 10 09/14/2019 1030   ALT 7 09/14/2019 1030   ALKPHOS 92 09/14/2019 1030   BILITOT <0.2 09/14/2019 1030      Component Value Date/Time   TSH 1.630 09/14/2019 1030   TSH 3.300 03/19/2019 1035   TSH 2.830 05/14/2018 1138   Results for BLAKLEY, MICHNA (MRN [id number]) as of 11/10/2019 09:44  Ref. Range 09/14/2019 10:30  Vitamin D, 25-Hydroxy Latest Ref Range: 30.0 - 100.0 ng/mL 26.4 (L)   OBESITY BEHAVIORAL INTERVENTION VISIT  Today's visit was #28  Starting weight: 242 lbs Starting date: 05/14/2018 Today's weight: 229 lbs  Today's date: 11/09/2019 Total lbs lost to date: 13     11/09/2019  Height 5\' 7"  (1.702 m)  Weight 229 lb (103.9 kg)  BMI (Calculated) 35.86  BLOOD PRESSURE - SYSTOLIC 124  BLOOD PRESSURE - DIASTOLIC 79   Body Fat % 40.7 %  Total Body Water (lbs) 89.6 lbs   ASK: We discussed the diagnosis of obesity with today and Yecenia agreed to give Lynnae Sandhoff permission to discuss obesity behavioral modification therapy today.  ASSESS: Annita has the diagnosis of obesity and her BMI  today is 35.9. Chyrl is in the action stage of change.   ADVISE: Yanna was educated on the multiple health risks of obesity as well as the benefit  of weight loss to improve her health. She was advised of the need for long term treatment and the importance of lifestyle modifications to improve her current health and to decrease her risk of future health problems.  AGREE: Multiple dietary modification options and treatment options were discussed and  [name] agreed to follow the recommendations documented in the above note.  ARRANGE: [name] was educated on the importance of frequent visits to treat obesity as outlined per CMS and USPSTF guidelines and agreed to schedule her next follow up appointment today.  IMarianna Payment, Denise Haag, am acting as Energy managertranscriptionist for Dean Foods CompanyErica R. Earlene PlaterWallace, DO  I have reviewed the above documentation for accuracy and completeness, and I agree with the above. Helane Rima- Edinson Domeier, DO

## 2019-11-30 ENCOUNTER — Encounter (INDEPENDENT_AMBULATORY_CARE_PROVIDER_SITE_OTHER): Payer: Self-pay | Admitting: Family Medicine

## 2019-11-30 ENCOUNTER — Other Ambulatory Visit: Payer: Self-pay

## 2019-11-30 ENCOUNTER — Ambulatory Visit (INDEPENDENT_AMBULATORY_CARE_PROVIDER_SITE_OTHER): Payer: BC Managed Care – PPO | Admitting: Family Medicine

## 2019-11-30 VITALS — BP 126/74 | HR 86 | Temp 98.1°F | Ht 67.0 in | Wt 229.0 lb

## 2019-11-30 DIAGNOSIS — E038 Other specified hypothyroidism: Secondary | ICD-10-CM

## 2019-11-30 DIAGNOSIS — I10 Essential (primary) hypertension: Secondary | ICD-10-CM

## 2019-11-30 DIAGNOSIS — Z9189 Other specified personal risk factors, not elsewhere classified: Secondary | ICD-10-CM | POA: Diagnosis not present

## 2019-11-30 DIAGNOSIS — R7303 Prediabetes: Secondary | ICD-10-CM

## 2019-11-30 DIAGNOSIS — E559 Vitamin D deficiency, unspecified: Secondary | ICD-10-CM | POA: Diagnosis not present

## 2019-11-30 DIAGNOSIS — G4709 Other insomnia: Secondary | ICD-10-CM

## 2019-11-30 DIAGNOSIS — Z6836 Body mass index (BMI) 36.0-36.9, adult: Secondary | ICD-10-CM

## 2019-11-30 DIAGNOSIS — K219 Gastro-esophageal reflux disease without esophagitis: Secondary | ICD-10-CM | POA: Diagnosis not present

## 2019-11-30 DIAGNOSIS — E66812 Obesity, class 2: Secondary | ICD-10-CM

## 2019-11-30 MED ORDER — TRAZODONE HCL 50 MG PO TABS: 25.0000 mg | ORAL_TABLET | Freq: Every evening | ORAL | 0 refills | Status: DC | PRN

## 2019-11-30 MED ORDER — VITAMIN D (ERGOCALCIFEROL) 1.25 MG (50000 UNIT) PO CAPS: 50000.0000 [IU] | ORAL_CAPSULE | ORAL | 0 refills | Status: DC

## 2019-11-30 MED ORDER — RYBELSUS 7 MG PO TABS: 7.0000 mg | ORAL_TABLET | Freq: Every day | ORAL | 0 refills | Status: DC

## 2019-12-01 NOTE — Progress Notes (Signed)
Office: [phone]  /  Fax: [phone]   HPI:  Chief Complaint: OBESITY Katelyn Walker is here to discuss her progress with her obesity treatment plan. She is keeping a food journal with 1200 calories and 80+ grams of protein and states she is following her eating plan approximately 80 % of the time. She states she is walking 30 minutes 2 times per week. Katelyn Walker is doing well. She likes her "cocktail" of medicines. Katelyn Walker is tolerating Rybelsus now and is ready to increase the dose. Katelyn Walker used the plate method during the Thanksgiving holiday. She is walking more.  Today's visit was # 29 Starting weight: 242 lbs Starting date: 05/14/18 Today's weight : Weight: 229 lb (103.9 kg)  Today's date: 11/30/2019 Total lbs lost to date: 13 Total lbs lost since last in-office visit: 0  Pre-Diabetes Marye's last A1c was 5.6 and her insulin was 14.9 on 09/14/19. Katelyn Walker is taking metformin and Rybelsus 3 mg daily.  At risk for diabetes Katelyn Walker is at higher than average risk for developing diabetes due to her pre-diabetes and obesity.   Vitamin D Deficiency Rosselyn's last vitamin D level was 26.4 on 09/14/19. She is currently taking vitamin D 2,000 units daily.  History of Levothyroxine use Katelyn Walker does not have a real history of thyroid lab abnormality. This was previously prescribed by another physician for weight loss. She stopped her levothyroxine at her last visit and she feels euthyroid. Her last TSH was 1.63 on 09/14/19.  Gastroesophageal Reflux Disease Katelyn Walker is taking Prilosec 40 mg PO daily. Katelyn Walker has a history of esophageal stricture, hiatal hernia, and esophagitis.  Hypertension Katelyn Walker is taking Diovan 80 mg PO daily. Katelyn Walker sometimes has orthostatic blood pressure in the mornings.  Insomnia Previously taking Ambien. Katelyn Walker does not want to continue with a controlled substance and needs the 10 mg dose (which is not recommended) to achieve sleep.  ASSESSMENT AND  PLAN:  Prediabetes - Plan: Semaglutide (RYBELSUS) 7 MG TABS  Vitamin D deficiency - Plan: Vitamin D, Ergocalciferol, (DRISDOL) 1.25 MG (50000 UT) CAPS capsule  Other specified hypothyroidism  Gastroesophageal reflux disease, unspecified whether esophagitis present  Essential hypertension  Other insomnia - Plan: traZODone (DESYREL) 50 MG tablet  At risk for diabetes mellitus  Class 2 severe obesity with serious comorbidity and body mass index (BMI) of 36.0 to 36.9 in adult, unspecified obesity type [facility])  PLAN:  Pre-Diabetes Katelyn Walker will continue to work on weight loss, exercise, and decreasing simple carbohydrates in her diet to help decrease the risk of diabetes. She was informed that eating too many simple carbohydrates or too many calories at one sitting increases the likelihood of GI side effects. Katelyn Walker agreed to increase her Rybelsus to 7 mg PO #30 daily with no refills and a prescription was written today. Katelyn Walker agreed to follow up with us as directed to monitor her progress in 2 weeks.   Diabetes risk counseling (~15 min) Katelyn Walker is a 48 y.o. female and has risk factors for diabetes including pre-diabetes and obesity. We discussed intensive lifestyle modifications today with an emphasis on weight loss as well as increasing exercise and decreasing simple carbohydrates in her diet.  Vitamin D Deficiency Katelyn Walker was informed that low vitamin D levels contribute to fatigue and are associated with obesity, breast, and colon cancer. Katelyn Walker agrees to begin to take prescription Vit D @50 ,000 IU weekly #4 with no refills and will follow up for routine testing of vitamin D, at least 2-3 times per year. She was informed of  the risk of over-replacement of vitamin D and agrees to not increase her dose unless she discusses this with [name] first. Kaja agrees to follow up in 2 weeks as directed.  Hypothyroidism Tamberlyn was informed of the importance of good thyroid control for overall  health and that supertherapeutic thyroid levels are dangerous and will not improve weight loss results. We will continue to monitor and recheck her TSH in 4 weeks.  Gastroesophageal Reflux Disease Aylee agrees to continue taking her proton pump inhibitor (ppi) and will return at the agreed upon time.  Hypertension Ziyana is okay to decrease her Diovan to 1/2 tablet now or in 2 weeks. Alese agrees to increase her water intake and will follow up as directed.  Insomnia Rosangela agrees to stop Ambien. Samaiya agrees to a trial of Trazodone 50 mg qhs #30 with no refills. Kamylah agrees to follow up in 2 weeks.  Obesity Lylliana is currently in the action stage of change. As such, her goal is to continue with weight loss efforts. She has agreed to keep a food journal with 1200 calories and 80+ grams of protein.  Kamaree has been instructed to work up to a goal of 150 minutes of combined cardio and strengthening exercise per week for weight loss and overall health benefits. We discussed the following Behavioral Modification Strategies today: holiday eating strategies, increase H2O intake, and planning for success.   Svetlana has agreed to follow up with our clinic in 2 weeks. She was informed of the importance of frequent follow up visits to maximize her success with intensive lifestyle modifications for her multiple health conditions.  ALLERGIES: No Known Allergies  MEDICATIONS: Current Outpatient Medications on File Prior to Visit  Medication Sig Dispense Refill  . Cholecalciferol (VITAMIN D3) 50 MCG (2000 UT) TABS Take 2,000 Units by mouth daily. 30 tablet 0  . CONTRAVE 8-90 MG TB12 TAKE TWO TABLETS BY MOUTH TWICE A DAY 120 tablet 0  . metFORMIN (GLUCOPHAGE) 500 MG tablet Take 1 tablet (500 mg total) by mouth 2 (two) times daily with a meal. 60 tablet 0  . Norethindrone Acetate-Ethinyl Estrad-FE (BLISOVI 24 FE) 1-20 MG-MCG(24) tablet Take 1 tablet by mouth daily.    Marland Kitchen omeprazole  (PRILOSEC) 40 MG capsule Take 40 mg by mouth daily.    . valsartan (DIOVAN) 80 MG tablet Take 80 mg by mouth daily.    Marland Kitchen levothyroxine (SYNTHROID, LEVOTHROID) 75 MCG tablet Take 75 mcg by mouth daily before breakfast.     No current facility-administered medications on file prior to visit.    PAST MEDICAL HISTORY: Past Medical History:  Diagnosis Date  . Back pain   . Dyspnea   . Elevated blood pressure reading without diagnosis of hypertension   . Esophageal ulcer   . GERD (gastroesophageal reflux disease)   . Hot flashes   . Hypothyroid   . Joint pain   . Sciatica, left side   . Vitamin B 12 deficiency   . Vitamin D deficiency     PAST SURGICAL HISTORY: Past Surgical History:  Procedure Laterality Date  . DILATION AND CURETTAGE OF UTERUS     2011   . OVARIAN CYST SURGERY     2009, 2013    SOCIAL HISTORY: Social History   Tobacco Use  . Smoking status: Never Smoker  . Smokeless tobacco: Never Used  Substance Use Topics  . Alcohol use: Not on file  . Drug use: Not on file    FAMILY HISTORY: Family History  Problem Relation Age of Onset  . Alcoholism Mother   . Eating disorder Mother   . Diabetes Father   . Hypertension Father   . Sleep apnea Father     ROS: Review of Systems  Constitutional: Negative for weight loss.  Psychiatric/Behavioral: The patient has insomnia.     PHYSICAL EXAM: Blood pressure 126/74, pulse 86, temperature 98.1 F (36.7 C), temperature source Oral, height 5\' 7"  (1.702 m), weight 229 lb (103.9 kg), SpO2 98 %. Body mass index is 35.87 kg/m. Physical Exam  RECENT LABS AND TESTS: BMET    Component Value Date/Time   NA 140 09/14/2019 1030   K 4.0 09/14/2019 1030   CL 100 09/14/2019 1030   CO2 22 09/14/2019 1030   GLUCOSE 88 09/14/2019 1030   BUN 9 09/14/2019 1030   CREATININE 1.06 (H) 09/14/2019 1030   CALCIUM 9.1 09/14/2019 1030   GFRNONAA 62 09/14/2019 1030   GFRAA 71 09/14/2019 1030   Lab Results  Component Value  Date   HGBA1C 5.6 09/14/2019   HGBA1C 5.8 (H) 03/19/2019   HGBA1C 5.8 (H) 10/13/2018   HGBA1C 5.8 (H) 05/14/2018   Lab Results  Component Value Date   INSULIN 14.9 09/14/2019   INSULIN 17.7 03/19/2019   INSULIN 17.4 10/13/2018   INSULIN 18.3 05/14/2018   CBC    Component Value Date/Time   WBC 8.7 05/14/2018 1138   WBC 13.7 (H) 09/09/2007 0546   RBC 4.96 05/14/2018 1138   RBC 3.11 (L) 09/09/2007 0546   HGB 12.0 05/14/2018 1138   HCT 38.8 05/14/2018 1138   PLT 197 09/09/2007 0546   MCV 78 (L) 05/14/2018 1138   MCH 24.2 (L) 05/14/2018 1138   MCHC 30.9 (L) 05/14/2018 1138   MCHC 33.6 09/09/2007 0546   RDW 16.7 (H) 05/14/2018 1138   LYMPHSABS 1.8 05/14/2018 1138   EOSABS 0.1 05/14/2018 1138   BASOSABS 0.0 05/14/2018 1138   Iron/TIBC/Ferritin/ %Sat No results found for: IRON, TIBC, FERRITIN, IRONPCTSAT Lipid Panel     Component Value Date/Time   CHOL 176 09/14/2019 1030   TRIG 138 09/14/2019 1030   HDL 47 09/14/2019 1030   LDLCALC 105 (H) 09/14/2019 1030   Hepatic Function Panel     Component Value Date/Time   PROT 6.9 09/14/2019 1030   ALBUMIN 4.0 09/14/2019 1030   AST 10 09/14/2019 1030   ALT 7 09/14/2019 1030   ALKPHOS 92 09/14/2019 1030   BILITOT <0.2 09/14/2019 1030      Component Value Date/Time   TSH 1.630 09/14/2019 1030   TSH 3.300 03/19/2019 1035   TSH 2.830 05/14/2018 1138   Results for RENIE, STELMACH (MRN [id number]) as of 12/01/2019 15:28  Ref. Range 09/14/2019 10:30  Vitamin D, 25-Hydroxy Latest Ref Range: 30.0 - 100.0 ng/mL 26.4 (L)    OBESITY BEHAVIORAL INTERVENTION VISIT DOCUMENTATION FOR INSURANCE (~15 minutes)   I, Marcille Blanco, CMA, am acting as transcriptionist for Briscoe Deutscher, DO  I have reviewed the above documentation for accuracy and completeness, and I agree with the above. Briscoe Deutscher, DO

## 2019-12-03 ENCOUNTER — Encounter (INDEPENDENT_AMBULATORY_CARE_PROVIDER_SITE_OTHER): Payer: Self-pay | Admitting: Family Medicine

## 2019-12-21 ENCOUNTER — Encounter (INDEPENDENT_AMBULATORY_CARE_PROVIDER_SITE_OTHER): Payer: Self-pay | Admitting: Family Medicine

## 2019-12-21 ENCOUNTER — Other Ambulatory Visit: Payer: Self-pay

## 2019-12-21 ENCOUNTER — Ambulatory Visit (INDEPENDENT_AMBULATORY_CARE_PROVIDER_SITE_OTHER): Payer: BC Managed Care – PPO | Admitting: Family Medicine

## 2019-12-21 VITALS — BP 112/72 | HR 84 | Temp 98.4°F | Ht 67.0 in | Wt 228.0 lb

## 2019-12-21 DIAGNOSIS — K2100 Gastro-esophageal reflux disease with esophagitis, without bleeding: Secondary | ICD-10-CM

## 2019-12-21 DIAGNOSIS — Z9189 Other specified personal risk factors, not elsewhere classified: Secondary | ICD-10-CM | POA: Diagnosis not present

## 2019-12-21 DIAGNOSIS — I10 Essential (primary) hypertension: Secondary | ICD-10-CM | POA: Diagnosis not present

## 2019-12-21 DIAGNOSIS — E559 Vitamin D deficiency, unspecified: Secondary | ICD-10-CM

## 2019-12-21 DIAGNOSIS — R7303 Prediabetes: Secondary | ICD-10-CM

## 2019-12-21 DIAGNOSIS — F3289 Other specified depressive episodes: Secondary | ICD-10-CM

## 2019-12-21 DIAGNOSIS — E038 Other specified hypothyroidism: Secondary | ICD-10-CM

## 2019-12-21 DIAGNOSIS — Z6835 Body mass index (BMI) 35.0-35.9, adult: Secondary | ICD-10-CM

## 2019-12-21 MED ORDER — LEVOTHYROXINE SODIUM 75 MCG PO TABS: 75.0000 ug | ORAL_TABLET | Freq: Every day | ORAL | 0 refills | Status: DC

## 2019-12-21 MED ORDER — VITAMIN D (ERGOCALCIFEROL) 1.25 MG (50000 UNIT) PO CAPS: 50000.0000 [IU] | ORAL_CAPSULE | ORAL | 0 refills | Status: DC

## 2019-12-21 MED ORDER — RYBELSUS 7 MG PO TABS: 7.0000 mg | ORAL_TABLET | Freq: Every day | ORAL | 0 refills | Status: DC

## 2019-12-21 MED ORDER — METFORMIN HCL 500 MG PO TABS: 500.0000 mg | ORAL_TABLET | Freq: Every day | ORAL | 0 refills | Status: DC

## 2019-12-21 NOTE — Progress Notes (Signed)
Chief Complaint: OBESITY Katelyn Walker is here to discuss her progress with her obesity treatment plan. Janay is on the keep a food journal and adhere to recommended goals of 1200 calories and 80 protein  and states she is following her eating plan approximately 90% of the time. Albie states she is walking for 30 minutes 1-2 times per week.  Today's visit was #: 30 Starting weight: 242 lbs Starting date: 05/14/2018 Today's weight: 228 lbs Today's date: 12/21/2019 Total lbs lost to date: 14 Total lbs lost since last in-office visit: 1 lb  Subjective:   Interim History: Katelyn Walker states she is doing well on her diet.  Hunger and cravings are controlled on current medications.  She wants to exercise more.  She used to swim prior to back surgery.  Her sleep is better with transition from Ambien to trazodone.  Assessment/Plan:     ICD-10-CM   1. Essential hypertension, Rx Diovan, in process of weaning  I10   2. Gastroesophageal reflux disease with esophagitis without hemorrhage  K21.00   3. Vitamin D deficiency  E55.9 Vitamin D, Ergocalciferol, (DRISDOL) 1.25 MG (50000 UT) CAPS capsule  4. Prediabetes, taking Rybelsus and Metformin  R73.03 Semaglutide (RYBELSUS) 7 MG TABS    metFORMIN (GLUCOPHAGE) 500 MG tablet  5. Emotional Eating, Rx Contrave  F32.89   6. Other specified hypothyroidism  E03.8 levothyroxine (SYNTHROID) 75 MCG tablet  7. At risk for diabetes mellitus  Z91.89   8. Class 2 severe obesity with serious comorbidity and body mass index (BMI) of 35.0 to 35.9 in adult, unspecified obesity type (HCC)  E66.01    Z68.35    1. Essential hypertension, Rx Diovan, in process of weaning  BP Readings from Last 3 Encounters:  12/21/19 112/72  11/30/19 126/74  11/09/19 124/79   Alika is working on healthy weight loss and exercise to improve blood pressure control. We will watch for signs of hypotension as she continues her lifestyle modifications.  2. Gastroesophageal  reflux disease with esophagitis without hemorrhage Current treatment plan is effective, no change in therapy. Orders and follow up as documented in patient record. Reviewed diet, exercise and weight control.  3. Vitamin D deficiency Low Vitamin D level contributes to fatigue and are associated with obesity, breast, and colon cancer. She agrees to continue to take prescription Vitamin D @50 ,000 IU every week and will follow-up for routine testing of vitamin D, at least 2-3 times per year to avoid over-replacement.  4. Prediabetes, taking Rybelsus and Metformin Loletta will continue to work on weight loss, exercise, and decreasing simple carbohydrates to help decrease the risk of diabetes.  Katelyn Walker is taking Rybelsus 7 mg and metformin 500 mg daily and tolerating them well.  She has had no low blood sugar events, but has had some nausea with high sugar meals.  Her last A1c was 5.6 on 09/14/2019.  We will recheck her labs at her next visit.  Orders -Rybelsus 7 mg once daily, #30, 0 refills. -Metformin 500 mg one tablet daily at breakfast, #30, 0 refills.    5. Emotional Eating, Rx Contrave Kailynne has been taking Contrave at lunchtime and has been doing well.  Behavior modification techniques were discussed today to help Donasia deal with her emotional/non-hunger eating behaviors.  Orders and follow up as documented in patient record.     6. Other specified hypothyroidism Patient with long-standing hypothyroidism, on levothyroxine therapy.  She appears euthyroid. Lab results reviewed with patient. Orders and follow up  as documented in patient record.  Orders -Levothyroxine 75 mcg one tablet daily, #30, 0 refills.  She agrees to follow-up with our office in 2 weeks.  Counseling . Good thyroid control is important for overall health. Supratherapeutic thyroid levels are dangerous and will not improve weight loss results. . The correct way to take levothyroxine is fasting, with water, separated by  at least 30 minutes from breakfast, and separated by more than 4 hours from calcium, iron, multivitamins, acid reflux medications (PPIs).   7. At risk for diabetes mellitus  Katelyn Walker is a 49 y.o. female and has risk factors for diabetes including obesity. We discussed intensive lifestyle modifications today with an emphasis on weight loss as well as increasing exercise and decreasing simple carbohydrates in her diet. (15 minutes)  8. Class 2 severe obesity with serious comorbidity and body mass index (BMI) of 35.0 to 35.9 in adult, unspecified obesity type (HCC) Katelyn Walker is currently in the action stage of change. As such, her goal is to continue with weight loss efforts. She has agreed to keep a food journal and adhere to recommended goals of 1200 calories and 80 protein . We discussed the following exercise goals today: For substantial health benefits, adults should do at least 150 minutes (2 hours and 30 minutes) a week of moderate-intensity, or 75 minutes (1 hour and 15 minutes) a week of vigorous-intensity aerobic physical activity, or an equivalent combination of moderate- and vigorous-intensity aerobic activity. Aerobic activity should be performed in episodes of at least 10 minutes, and preferably, it should be spread throughout the week. Adults should also include muscle-strengthening activities that involve all major muscle groups on 2 or more days a week.   We discussed the following behavioral modification strategies today: decreasing simple carbohydrates , increasing water intake and emotional eating strategies.  KEIRSTON SAEPHANH has agreed to follow-up with our clinic in 2 weeks. She was informed of the importance of frequent follow-up visits to maximize her success with intensive lifestyle modifications for her multiple health conditions.  Objective:   Blood pressure 112/72, pulse 84, temperature 98.4 F (36.9 C), temperature source Oral, height 5\' 7"  (1.702 m), weight 228 lb (103.4  kg), SpO2 98 %. Body mass index is 35.71 kg/m.  General: Cooperative, alert, well developed, in no acute distress. HEENT: Conjunctivae and lids unremarkable. Neck: No thyromegaly.  Cardiovascular: Regular rhythm.  Lungs: Normal work of breathing. Extremities: No edema.  Neurologic: No focal deficits.   Lab Results  Component Value Date   CREATININE 1.06 (H) 09/14/2019   BUN 9 09/14/2019   NA 140 09/14/2019   K 4.0 09/14/2019   CL 100 09/14/2019   CO2 22 09/14/2019   Lab Results  Component Value Date   ALT 7 09/14/2019   AST 10 09/14/2019   ALKPHOS 92 09/14/2019   BILITOT <0.2 09/14/2019   Lab Results  Component Value Date   HGBA1C 5.6 09/14/2019   HGBA1C 5.8 (H) 03/19/2019   HGBA1C 5.8 (H) 10/13/2018   HGBA1C 5.8 (H) 05/14/2018   Lab Results  Component Value Date   INSULIN 14.9 09/14/2019   INSULIN 17.7 03/19/2019   INSULIN 17.4 10/13/2018   INSULIN 18.3 05/14/2018   Lab Results  Component Value Date   TSH 1.630 09/14/2019   Lab Results  Component Value Date   CHOL 176 09/14/2019   HDL 47 09/14/2019   LDLCALC 105 (H) 09/14/2019   TRIG 138 09/14/2019   Lab Results  Component Value Date   WBC  8.7 05/14/2018   HGB 12.0 05/14/2018   HCT 38.8 05/14/2018   MCV 78 (L) 05/14/2018   PLT 197 09/09/2007   Attestation Statements:   Reviewed by clinician on day of visit: allergies, medications, problem list, medical history, surgical history, family history, social history and previous encounter notes.  This visit occurred during the SARS-CoV-2 public health emergency. Safety protocols were in place, including screening questions prior to the visit, additional usage of staff PPE, and extensive cleaning of exam room while observing appropriate contact time as indicated for disinfecting solutions. (CPT [id number])  I, Insurance claims handler, am acting as Energy manager for [employer], DO.  I have reviewed the above documentation for accuracy and completeness, and I agree  with the above. Helane Rima, DO

## 2019-12-28 ENCOUNTER — Encounter (INDEPENDENT_AMBULATORY_CARE_PROVIDER_SITE_OTHER): Payer: Self-pay | Admitting: Family Medicine

## 2019-12-29 ENCOUNTER — Other Ambulatory Visit (INDEPENDENT_AMBULATORY_CARE_PROVIDER_SITE_OTHER): Payer: Self-pay

## 2019-12-29 ENCOUNTER — Encounter (INDEPENDENT_AMBULATORY_CARE_PROVIDER_SITE_OTHER): Payer: Self-pay

## 2019-12-29 DIAGNOSIS — G4709 Other insomnia: Secondary | ICD-10-CM

## 2019-12-29 MED ORDER — TRAZODONE HCL 50 MG PO TABS: 25.0000 mg | ORAL_TABLET | Freq: Every evening | ORAL | 0 refills | Status: DC | PRN

## 2020-01-04 ENCOUNTER — Encounter (INDEPENDENT_AMBULATORY_CARE_PROVIDER_SITE_OTHER): Payer: Self-pay | Admitting: Family Medicine

## 2020-01-04 ENCOUNTER — Other Ambulatory Visit: Payer: Self-pay

## 2020-01-04 ENCOUNTER — Ambulatory Visit (INDEPENDENT_AMBULATORY_CARE_PROVIDER_SITE_OTHER): Payer: BC Managed Care – PPO | Admitting: Family Medicine

## 2020-01-04 VITALS — BP 106/72 | HR 76 | Temp 98.2°F | Ht 67.0 in | Wt 228.0 lb

## 2020-01-04 DIAGNOSIS — E038 Other specified hypothyroidism: Secondary | ICD-10-CM | POA: Diagnosis not present

## 2020-01-04 DIAGNOSIS — G4709 Other insomnia: Secondary | ICD-10-CM | POA: Diagnosis not present

## 2020-01-04 DIAGNOSIS — Z6835 Body mass index (BMI) 35.0-35.9, adult: Secondary | ICD-10-CM

## 2020-01-04 DIAGNOSIS — E559 Vitamin D deficiency, unspecified: Secondary | ICD-10-CM

## 2020-01-04 DIAGNOSIS — F3289 Other specified depressive episodes: Secondary | ICD-10-CM

## 2020-01-04 DIAGNOSIS — Z9189 Other specified personal risk factors, not elsewhere classified: Secondary | ICD-10-CM | POA: Diagnosis not present

## 2020-01-04 DIAGNOSIS — I10 Essential (primary) hypertension: Secondary | ICD-10-CM

## 2020-01-04 DIAGNOSIS — R7303 Prediabetes: Secondary | ICD-10-CM

## 2020-01-04 DIAGNOSIS — E66812 Obesity, class 2: Secondary | ICD-10-CM

## 2020-01-04 MED ORDER — CONTRAVE 8-90 MG PO TB12: 2.0000 | ORAL_TABLET | Freq: Two times a day (BID) | ORAL | 0 refills | Status: DC

## 2020-01-04 MED ORDER — RYBELSUS 7 MG PO TABS: 7.0000 mg | ORAL_TABLET | Freq: Every day | ORAL | 0 refills | Status: DC

## 2020-01-04 MED ORDER — LEVOTHYROXINE SODIUM 75 MCG PO TABS: 75.0000 ug | ORAL_TABLET | Freq: Every day | ORAL | 0 refills | Status: DC

## 2020-01-04 MED ORDER — TRAZODONE HCL 50 MG PO TABS: 25.0000 mg | ORAL_TABLET | Freq: Every evening | ORAL | 0 refills | Status: DC | PRN

## 2020-01-04 MED ORDER — METFORMIN HCL 500 MG PO TABS: 500.0000 mg | ORAL_TABLET | Freq: Every day | ORAL | 0 refills | Status: DC

## 2020-01-04 MED ORDER — VITAMIN D (ERGOCALCIFEROL) 1.25 MG (50000 UNIT) PO CAPS: 50000.0000 [IU] | ORAL_CAPSULE | ORAL | 0 refills | Status: DC

## 2020-01-04 NOTE — Progress Notes (Signed)
Chief Complaint:   OBESITY Katelyn Walker is here to discuss her progress with her obesity treatment plan along with follow-up of her obesity related diagnoses. Katelyn Walker is on keeping a food journal and adhering to recommended goals of 1200 calories and 80 grams of protein and states she is following her eating plan approximately 90% of the time. Katelyn Walker states she is walking for 30 minutes 2 times per week.  Today's visit was #: 33 Starting weight: 242 lbs Starting date: 05/14/2018 Today's weight: 228 lbs Today's date: 01/04/2020 Total lbs lost to date: 14 lbs Total lbs lost since last in-office visit: 0  Interim History: Katelyn Walker is adhering to the diet well.  She says she sometimes struggles to get in all the protein.  Subjective:   1. Other specified hypothyroidism Currently taking levothyroxine 75 mcg daily.  Lab Results  Component Value Date   TSH 1.630 09/14/2019   2. Essential hypertension Review: taking medications as instructed, no medication side effects noted, no chest pain on exertion, no dyspnea on exertion, no swelling of ankles.  She is taking Diovan 80 mg 1/2 tablet daily.  She is working on weaning off this.  Her blood pressure is too low now.     BP Readings from Last 3 Encounters:  01/04/20 106/72  12/21/19 112/72  11/30/19 126/74   3. Prediabetes Zakyia has a diagnosis of prediabetes based on her elevated HgA1c and was informed this puts her at greater risk of developing diabetes. She continues to work on diet and exercise to decrease her risk of diabetes. She denies nausea or hypoglycemia.  She is currently taking Rybelsus 7 mg daily and metformin 500 mg daily.  Lab Results  Component Value Date   HGBA1C 5.6 09/14/2019   Lab Results  Component Value Date   INSULIN 14.9 09/14/2019   INSULIN 17.7 03/19/2019   INSULIN 17.4 10/13/2018   INSULIN 18.3 05/14/2018   4. Vitamin D deficiency Katelyn Walker's Vitamin D level was 26.4 on 09/14/2019. She is currently  taking vit D. She denies nausea, vomiting or muscle weakness.  5. Other insomnia Taking trazodone as needed for sleep.  6. Other depression, with emotional eating  Katelyn Walker is struggling with emotional eating and using food for comfort to the extent that it is negatively impacting her health. She has been working on behavior modification techniques to help reduce her emotional eating and has been unsuccessful. She shows no sign of suicidal or homicidal ideations.  Katelyn Walker is currently taking Contrave to help with her emotional eating.  7. At risk for hypertension The patient is at a higher than average risk of hypertension.  Assessment/Plan:   1. Other specified hypothyroidism Patient with long-standing hypothyroidism, on levothyroxine therapy. She appears euthyroid. Orders and follow up as documented in patient record.  Will check labs today.  Counseling . Good thyroid control is important for overall health. Supratherapeutic thyroid levels are dangerous and will not improve weight loss results. . The correct way to take levothyroxine is fasting, with water, separated by at least 30 minutes from breakfast, and separated by more than 4 hours from calcium, iron, multivitamins, acid reflux medications (PPIs).   Orders - CBC with Differential/Platelet - Lipid Panel With LDL/HDL Ratio - T3 - T4, free - TSH - levothyroxine (SYNTHROID) 75 MCG tablet; Take 1 tablet (75 mcg total) by mouth daily.  Dispense: 30 tablet; Refill: 0  2. Essential hypertension Jenness is working on healthy weight loss and exercise to improve blood pressure control.  We will watch for signs of hypotension as she continues her lifestyle modifications.  Orders - Comprehensive metabolic panel  3. Prediabetes Katelyn Walker will continue to work on weight loss, exercise, and decreasing simple carbohydrates to help decrease the risk of diabetes.   Orders - Comprehensive metabolic panel - Hemoglobin A1c - Insulin,  random - metFORMIN (GLUCOPHAGE) 500 MG tablet; Take 1 tablet (500 mg total) by mouth daily with breakfast.  Dispense: 30 tablet; Refill: 0 - Katelyn Walker (RYBELSUS) 7 MG TABS; Take 7 mg by mouth daily.  Dispense: 30 tablet; Refill: 0  4. Vitamin D deficiency Low Vitamin D level contributes to fatigue and are associated with obesity, breast, and colon cancer. She agrees to continue to take prescription Vitamin D @50 ,000 IU every week and will follow-up for routine testing of Vitamin D, at least 2-3 times per year to avoid over-replacement.  Orders - VITAMIN D 25 Hydroxy (Vit-D Deficiency, Fractures) - Vitamin D, Ergocalciferol, (DRISDOL) 1.25 MG (50000 UNIT) CAPS capsule; Take 1 capsule (50,000 Units total) by mouth every 7 (seven) days.  Dispense: 4 capsule; Refill: 0  5. Other insomnia The problem of recurrent insomnia was discussed. Orders and follow up as documented in patient record. Counseling: Intensive lifestyle modifications are the first line treatment for this issue. We discussed several lifestyle modifications today and she will continue to work on diet, exercise and weight loss efforts.   Counseling  Limit or avoid alcohol, caffeinated beverages, and cigarettes, especially close to bedtime.   Do not eat a large meal or eat spicy foods right before bedtime. This can lead to digestive discomfort that can make it hard for you to sleep.  Keep a sleep diary to help you and your health care provider figure out what could be causing your insomnia.  . Make your bedroom a dark, comfortable place where it is easy to fall asleep. ? Put up shades or blackout curtains to block light from outside. ? Use a white noise machine to block noise. ? Keep the temperature cool. . Limit screen use before bedtime. This includes: ? Watching TV. ? Using your smartphone, tablet, or computer. . Stick to a routine that includes going to bed and waking up at the same times every day and night. This can help  you fall asleep faster. Consider making a quiet activity, such as reading, part of your nighttime routine. . Try to avoid taking naps during the day so that you sleep better at night. . Get out of bed if you are still awake after 15 minutes of trying to sleep. Keep the lights down, but try reading or doing a quiet activity. When you feel sleepy, go back to bed.  Orders - CBC with Differential/Platelet - traZODone (DESYREL) 50 MG tablet; Take 0.5-1 tablets (25-50 mg total) by mouth at bedtime as needed for sleep.  Dispense: 30 tablet; Refill: 0  6. Other depression, with emotional eating  Behavior modification techniques were discussed today to help [place] deal with her emotional/non-hunger eating behaviors.  Orders and follow up as documented in patient record.   Orders - Naltrexone-buPROPion HCl ER (CONTRAVE) 8-90 MG TB12; Take 2 tablets by mouth 2 (two) times daily.  Dispense: 120 tablet; Refill: 0  7. At risk for hypertension ALMEE PELPHREY was given approximately 15 minutes of hypertension prevention counseling today. Cathie is at risk for hypertension due to obesity. We discussed intensive lifestyle modifications today with an emphasis on weight loss as well as increasing exercise and decreasing salt  intake.  Repetitive spaced learning was employed today to elicit superior memory formation and behavioral change.  8. Class 2 severe obesity with serious comorbidity and body mass index (BMI) of 35.0 to 35.9 in adult, unspecified obesity type (HCC) Darria is currently in the action stage of change. As such, her goal is to continue with weight loss efforts. She has agreed to keeping a food journal and adhering to recommended goals of 1200 calories and 80 grams of protein.   Exercise goals: For substantial health benefits, adults should do at least 150 minutes (2 hours and 30 minutes) a week of moderate-intensity, or 75 minutes (1 hour and 15 minutes) a week of vigorous-intensity aerobic  physical activity, or an equivalent combination of moderate- and vigorous-intensity aerobic activity. Aerobic activity should be performed in episodes of at least 10 minutes, and preferably, it should be spread throughout the week. Adults should also include muscle-strengthening activities that involve all major muscle groups on 2 or more days a week.  Behavioral modification strategies: increasing lean protein intake, decreasing simple carbohydrates, increasing vegetables and increasing water intake.  Katelyn Walker has agreed to follow-up with our clinic in 2 weeks. She was informed of the importance of frequent follow-up visits to maximize her success with intensive lifestyle modifications for her multiple health conditions.   Katelyn Walker was informed we would discuss her lab results at her next visit unless there is a critical issue that needs to be addressed sooner. Katelyn Walker agreed to keep her next visit at the agreed upon time to discuss these results.  Objective:   Blood pressure 106/72, pulse 76, temperature 98.2 F (36.8 C), temperature source Oral, height 5\' 7"  (1.702 m), weight 228 lb (103.4 kg), last menstrual period 12/21/2019, SpO2 99 %. Body mass index is 35.71 kg/m.  General: Cooperative, alert, well developed, in no acute distress. HEENT: Conjunctivae and lids unremarkable. Cardiovascular: Regular rhythm.  Lungs: Normal work of breathing. Neurologic: No focal deficits.   Lab Results  Component Value Date   CREATININE 1.06 (H) 09/14/2019   BUN 9 09/14/2019   NA 140 09/14/2019   K 4.0 09/14/2019   CL 100 09/14/2019   CO2 22 09/14/2019   Lab Results  Component Value Date   ALT 7 09/14/2019   AST 10 09/14/2019   ALKPHOS 92 09/14/2019   BILITOT <0.2 09/14/2019   Lab Results  Component Value Date   HGBA1C 5.6 09/14/2019   HGBA1C 5.8 (H) 03/19/2019   HGBA1C 5.8 (H) 10/13/2018   HGBA1C 5.8 (H) 05/14/2018   Lab Results  Component Value Date   INSULIN 14.9 09/14/2019    INSULIN 17.7 03/19/2019   INSULIN 17.4 10/13/2018   INSULIN 18.3 05/14/2018   Lab Results  Component Value Date   TSH 1.630 09/14/2019   Lab Results  Component Value Date   CHOL 176 09/14/2019   HDL 47 09/14/2019   LDLCALC 105 (H) 09/14/2019   TRIG 138 09/14/2019   Lab Results  Component Value Date   WBC 8.7 05/14/2018   HGB 12.0 05/14/2018   HCT 38.8 05/14/2018   MCV 78 (L) 05/14/2018   PLT 197 09/09/2007   Attestation Statements:   Reviewed by clinician on day of visit: allergies, medications, problem list, medical history, surgical history, family history, social history, and previous encounter notes.  I, 09/11/2007, CMA, am acting as Insurance claims handler for Energy manager, DO.  I have reviewed the above documentation for accuracy and completeness, and I agree with the above. [employer],  DO

## 2020-01-05 LAB — HEMOGLOBIN A1C
Est. average glucose Bld gHb Est-mCnc: 114 mg/dL
Hgb A1c MFr Bld: 5.6 % (ref 4.8–5.6)

## 2020-01-05 LAB — CBC WITH DIFFERENTIAL/PLATELET
Basophils Absolute: 0 10*3/uL (ref 0.0–0.2)
Basos: 0 %
EOS (ABSOLUTE): 0.2 10*3/uL (ref 0.0–0.4)
Eos: 2 %
Hematocrit: 34.3 % (ref 34.0–46.6)
Hemoglobin: 10.9 g/dL — ABNORMAL LOW (ref 11.1–15.9)
Immature Grans (Abs): 0 10*3/uL (ref 0.0–0.1)
Immature Granulocytes: 0 %
Lymphocytes Absolute: 1.9 10*3/uL (ref 0.7–3.1)
Lymphs: 20 %
MCH: 23.4 pg — ABNORMAL LOW (ref 26.6–33.0)
MCHC: 31.8 g/dL (ref 31.5–35.7)
MCV: 74 fL — ABNORMAL LOW (ref 79–97)
Monocytes Absolute: 0.5 10*3/uL (ref 0.1–0.9)
Monocytes: 5 %
Neutrophils Absolute: 6.7 10*3/uL (ref 1.4–7.0)
Neutrophils: 73 %
Platelets: 341 10*3/uL (ref 150–450)
RBC: 4.65 x10E6/uL (ref 3.77–5.28)
RDW: 15.4 % (ref 11.7–15.4)
WBC: 9.3 10*3/uL (ref 3.4–10.8)

## 2020-01-05 LAB — COMPREHENSIVE METABOLIC PANEL
ALT: 6 IU/L (ref 0–32)
AST: 10 IU/L (ref 0–40)
Albumin/Globulin Ratio: 1.6 (ref 1.2–2.2)
Albumin: 3.9 g/dL (ref 3.8–4.8)
Alkaline Phosphatase: 86 IU/L (ref 39–117)
BUN/Creatinine Ratio: 11 (ref 9–23)
BUN: 9 mg/dL (ref 6–24)
Bilirubin Total: 0.2 mg/dL (ref 0.0–1.2)
CO2: 23 mmol/L (ref 20–29)
Calcium: 8.6 mg/dL — ABNORMAL LOW (ref 8.7–10.2)
Chloride: 101 mmol/L (ref 96–106)
Creatinine, Ser: 0.79 mg/dL (ref 0.57–1.00)
GFR calc Af Amer: 102 mL/min/{1.73_m2} (ref >59)
GFR calc non Af Amer: 88 mL/min/{1.73_m2} (ref >59)
Globulin, Total: 2.5 g/dL (ref 1.5–4.5)
Glucose: 71 mg/dL (ref 65–99)
Potassium: 4.2 mmol/L (ref 3.5–5.2)
Sodium: 139 mmol/L (ref 134–144)
Total Protein: 6.4 g/dL (ref 6.0–8.5)

## 2020-01-05 LAB — INSULIN, RANDOM: INSULIN: 21.6 u[IU]/mL (ref 2.6–24.9)

## 2020-01-05 LAB — TSH: TSH: 4.08 u[IU]/mL (ref 0.450–4.500)

## 2020-01-05 LAB — LIPID PANEL WITH LDL/HDL RATIO
Cholesterol, Total: 168 mg/dL (ref 100–199)
HDL: 49 mg/dL (ref >39)
LDL Chol Calc (NIH): 93 mg/dL (ref 0–99)
LDL/HDL Ratio: 1.9 ratio (ref 0.0–3.2)
Triglycerides: 152 mg/dL — ABNORMAL HIGH (ref 0–149)
VLDL Cholesterol Cal: 26 mg/dL (ref 5–40)

## 2020-01-05 LAB — VITAMIN D 25 HYDROXY (VIT D DEFICIENCY, FRACTURES): Vit D, 25-Hydroxy: 44.5 ng/mL (ref 30.0–100.0)

## 2020-01-05 LAB — T3: T3, Total: 151 ng/dL (ref 71–180)

## 2020-01-05 LAB — T4, FREE: Free T4: 1.06 ng/dL (ref 0.82–1.77)

## 2020-01-18 ENCOUNTER — Other Ambulatory Visit: Payer: Self-pay

## 2020-01-18 ENCOUNTER — Encounter (INDEPENDENT_AMBULATORY_CARE_PROVIDER_SITE_OTHER): Payer: Self-pay | Admitting: Family Medicine

## 2020-01-18 ENCOUNTER — Ambulatory Visit (INDEPENDENT_AMBULATORY_CARE_PROVIDER_SITE_OTHER): Payer: BC Managed Care – PPO | Admitting: Family Medicine

## 2020-01-18 VITALS — BP 110/74 | HR 74 | Temp 97.9°F | Ht 67.0 in | Wt 226.0 lb

## 2020-01-18 DIAGNOSIS — E039 Hypothyroidism, unspecified: Secondary | ICD-10-CM | POA: Diagnosis not present

## 2020-01-18 DIAGNOSIS — R7303 Prediabetes: Secondary | ICD-10-CM

## 2020-01-18 DIAGNOSIS — E038 Other specified hypothyroidism: Secondary | ICD-10-CM

## 2020-01-18 DIAGNOSIS — Z6835 Body mass index (BMI) 35.0-35.9, adult: Secondary | ICD-10-CM

## 2020-01-18 DIAGNOSIS — Z9189 Other specified personal risk factors, not elsewhere classified: Secondary | ICD-10-CM

## 2020-01-18 DIAGNOSIS — G4709 Other insomnia: Secondary | ICD-10-CM

## 2020-01-18 DIAGNOSIS — E559 Vitamin D deficiency, unspecified: Secondary | ICD-10-CM

## 2020-01-18 DIAGNOSIS — F3289 Other specified depressive episodes: Secondary | ICD-10-CM

## 2020-01-18 DIAGNOSIS — D508 Other iron deficiency anemias: Secondary | ICD-10-CM

## 2020-01-18 MED ORDER — VITAMIN D (ERGOCALCIFEROL) 1.25 MG (50000 UNIT) PO CAPS: 50000.0000 [IU] | ORAL_CAPSULE | ORAL | 0 refills | Status: DC

## 2020-01-18 NOTE — Progress Notes (Signed)
Chief Complaint:   OBESITY Katelyn Walker is here to discuss her progress with her obesity treatment plan along with follow-up of her obesity related diagnoses. Katelyn Walker is on keeping a food journal and adhering to recommended goals of 1200 calories and 80 grams of protein and states she is following her eating plan approximately 80% of the time. Katelyn Walker states she is doing exercise videos for 30 minutes 2 times per week.  Today's visit was #: 32 Starting weight: 242 lbs Starting date: 05/14/2018 Today's weight: 226 lbs Today's date: 01/18/2020 Total lbs lost to date: 16 lbs Total lbs lost since last in-office visit: 2 lbs  Interim History: Katelyn Walker has been adhereing to her diet well.  She has also been doing exercise videos with her 58 year old daughter.  She has been more tired lately.  Subjective:   1. Subclinical hypothyroidism Katelyn Walker was previously on levothyroxine.  Labs are stable at this time.  Lab Results  Component Value Date   TSH 4.080 01/04/2020   2. Prediabetes Katelyn Walker has a diagnosis of prediabetes based on her elevated HgA1c and was informed this puts her at greater risk of developing diabetes. She continues to work on diet and exercise to decrease her risk of diabetes. She denies nausea or hypoglycemia.  She is currently taking metformin 500 mg daily.  Lab Results  Component Value Date   HGBA1C 5.6 01/04/2020   Lab Results  Component Value Date   INSULIN 21.6 01/04/2020   INSULIN 14.9 09/14/2019   INSULIN 17.7 03/19/2019   INSULIN 17.4 10/13/2018   INSULIN 18.3 05/14/2018   3. Other insomnia Katelyn Walker is currently taking trazodone 25-50 mg at night as needed for sleep.  4. Vitamin D deficiency Katelyn Walker Vitamin D level was 44.5 on 01/04/2020. She is currently taking vit D. She denies nausea, vomiting or muscle weakness.  5. Other iron deficiency anemia Katelyn Walker is not a vegetarian.  She does not have a history of weight loss surgery.   CBC Latest Ref Rng &  Units 01/04/2020 05/14/2018 09/09/2007  WBC 3.4 - 10.8 x10E3/uL 9.3 8.7 13.7(H)  Hemoglobin 11.1 - 15.9 g/dL 10.9(L) 12.0 8.9 DELTA CHECK NOTED(L)  Hematocrit 34.0 - 46.6 % 34.3 38.8 26.6(L)  Platelets 150 - 450 x10E3/uL 341 - 197   Lab Results  Component Value Date   VITAMINB12 339 05/14/2018   6. Other depression, with emotional eating  Katelyn Walker is struggling with emotional eating and using food for comfort to the extent that it is negatively impacting her health. She has been working on behavior modification techniques to help reduce her emotional eating and has been unsuccessful. She shows no sign of suicidal or homicidal ideations.  She is taking Contrave and Rybelsus.  7. At risk for constipation Katelyn Walker is at increased risk for constipation due to inadequate water intake, changes in diet, and/or use of medications such as GLP1 agonists. Katelyn Walker denies hard, infrequent stools currently.   Assessment/Plan:   1. Subclinical hypothyroidism She appears euthyroid. Will continue watching Katelyn Walker labs.  No replacement at this time.  Counseling . Good thyroid control is important for overall health. Supratherapeutic thyroid levels are dangerous and will not improve weight loss results. . The correct way to take levothyroxine is fasting, with water, separated by at least 30 minutes from breakfast, and separated by more than 4 hours from calcium, iron, multivitamins, acid reflux medications (PPIs).   2. Prediabetes Katelyn Walker will continue to work on weight loss, exercise, and decreasing simple carbohydrates to help decrease  the risk of diabetes.   3. Other insomnia Orders and follow up as documented in patient record. Counseling: Intensive lifestyle modifications are the first line treatment for this issue. We discussed several lifestyle modifications today and she will continue to work on diet, exercise and weight loss efforts.   4. Vitamin D deficiency Low Vitamin D level contributes to  fatigue and are associated with obesity, breast, and colon cancer. She agrees to continue to take prescription Vitamin D @50 ,000 IU every week and will follow-up for routine testing of Vitamin D, at least 2-3 times per year to avoid over-replacement.  Orders - Vitamin D, Ergocalciferol, (DRISDOL) 1.25 MG (50000 UNIT) CAPS capsule; Take 1 capsule (50,000 Units total) by mouth every 7 (seven) days.  Dispense: 4 capsule; Refill: 0  5. Other iron deficiency anemia Orders and follow up as documented in patient record.  Counseling . Iron is essential for our bodies to make red blood cells.  Reasons that someone may be deficient include: an iron-deficient diet (more likely in those following vegan or vegetarian diets), women with heavy menses, patients with GI disorders or poor absorption, patients that have had bariatric surgery, frequent blood donors, patients with cancer, and patients with heart disease.   An iron supplement has been recommended. This is found over-the-counter. We recommend: Once Daily: Slow Fe 45mg  Iron Supplement for Iron Deficiency, Slow Release, High Potency, Easy to Swallow Tablets..  . Iron-rich foods include dark leafy greens, red and white meats, eggs, seafood, and beans.   . Certain foods and drinks prevent your body from absorbing iron properly. Avoid eating these foods in the same meal as iron-rich foods or with iron supplements. These foods include: coffee, black tea, and red wine; milk, dairy products, and foods that are high in calcium; beans and soybeans; whole grains.  . Constipation can be a side effect of iron supplementation. Increased water and fiber intake are helpful. Water goal: > 2 liters/day. Fiber goal: > 25 grams/day.  6. Other depression, with emotional eating  Behavior modification techniques were discussed today to help Katelyn Walker deal with her emotional/non-hunger eating behaviors.  Orders and follow up as documented in patient record.   7. At risk for  constipation Katelyn Walker was given approximately 15 minutes of counseling today regarding prevention of constipation. She was encouraged to increase water and fiber intake.   8. Class 2 severe obesity with serious comorbidity and body mass index (BMI) of 35.0 to 35.9 in adult, unspecified obesity type (HCC) Katelyn Walker is currently in the action stage of change. As such, her goal is to continue with weight loss efforts. She has agreed to keeping a food journal and adhering to recommended goals of 1200 calories and 80 grams of protein.   Exercise goals: For substantial health benefits, adults should do at least 150 minutes (2 hours and 30 minutes) a week of moderate-intensity, or 75 minutes (1 hour and 15 minutes) a week of vigorous-intensity aerobic physical activity, or an equivalent combination of moderate- and vigorous-intensity aerobic activity. Aerobic activity should be performed in episodes of at least 10 minutes, and preferably, it should be spread throughout the week. Adults should also include muscle-strengthening activities that involve all major muscle groups on 2 or more days a week.  Behavioral modification strategies: increasing lean protein intake and planning for success.  Katelyn Walker has agreed to follow-up with our clinic in 2 weeks. She was informed of the importance of frequent follow-up visits to maximize her success with intensive lifestyle  modifications for her multiple health conditions.   Objective:   Blood pressure 110/74, pulse 74, temperature 97.9 F (36.6 C), temperature source Oral, height 5\' 7"  (1.702 m), weight 226 lb (102.5 kg), last menstrual period 12/21/2019, SpO2 99 %. Body mass index is 35.4 kg/m.  General: Cooperative, alert, well developed, in no acute distress. HEENT: Conjunctivae and lids unremarkable. Cardiovascular: Regular rhythm.  Lungs: Normal work of breathing. Neurologic: No focal deficits.   Lab Results  Component Value Date   CREATININE 0.79  01/04/2020   BUN 9 01/04/2020   NA 139 01/04/2020   K 4.2 01/04/2020   CL 101 01/04/2020   CO2 23 01/04/2020   Lab Results  Component Value Date   ALT 6 01/04/2020   AST 10 01/04/2020   ALKPHOS 86 01/04/2020   BILITOT 0.2 01/04/2020   Lab Results  Component Value Date   HGBA1C 5.6 01/04/2020   HGBA1C 5.6 09/14/2019   HGBA1C 5.8 (H) 03/19/2019   HGBA1C 5.8 (H) 10/13/2018   HGBA1C 5.8 (H) 05/14/2018   Lab Results  Component Value Date   INSULIN 21.6 01/04/2020   INSULIN 14.9 09/14/2019   INSULIN 17.7 03/19/2019   INSULIN 17.4 10/13/2018   INSULIN 18.3 05/14/2018   Lab Results  Component Value Date   TSH 4.080 01/04/2020   Lab Results  Component Value Date   CHOL 168 01/04/2020   HDL 49 01/04/2020   LDLCALC 93 01/04/2020   TRIG 152 (H) 01/04/2020   Lab Results  Component Value Date   WBC 9.3 01/04/2020   HGB 10.9 (L) 01/04/2020   HCT 34.3 01/04/2020   MCV 74 (L) 01/04/2020   PLT 341 01/04/2020   Attestation Statements:   Reviewed by clinician on day of visit: allergies, medications, problem list, medical history, surgical history, family history, social history, and previous encounter notes.  I, Water quality scientist, CMA, am acting as Location manager for [employer], DO.  I have reviewed the above documentation for accuracy and completeness, and I agree with the above. Briscoe Deutscher, DO

## 2020-01-21 ENCOUNTER — Other Ambulatory Visit (INDEPENDENT_AMBULATORY_CARE_PROVIDER_SITE_OTHER): Payer: Self-pay | Admitting: Family Medicine

## 2020-01-21 ENCOUNTER — Encounter (INDEPENDENT_AMBULATORY_CARE_PROVIDER_SITE_OTHER): Payer: Self-pay | Admitting: Family Medicine

## 2020-01-21 DIAGNOSIS — R7303 Prediabetes: Secondary | ICD-10-CM

## 2020-01-21 NOTE — Telephone Encounter (Signed)
Please advise 

## 2020-02-03 ENCOUNTER — Encounter (INDEPENDENT_AMBULATORY_CARE_PROVIDER_SITE_OTHER): Payer: Self-pay | Admitting: Family Medicine

## 2020-02-03 ENCOUNTER — Ambulatory Visit (INDEPENDENT_AMBULATORY_CARE_PROVIDER_SITE_OTHER): Payer: BC Managed Care – PPO | Admitting: Family Medicine

## 2020-02-03 ENCOUNTER — Other Ambulatory Visit: Payer: Self-pay

## 2020-02-03 VITALS — BP 113/73 | HR 87 | Temp 98.4°F | Ht 67.0 in | Wt 226.0 lb

## 2020-02-03 DIAGNOSIS — D508 Other iron deficiency anemias: Secondary | ICD-10-CM | POA: Diagnosis not present

## 2020-02-03 DIAGNOSIS — R7303 Prediabetes: Secondary | ICD-10-CM

## 2020-02-03 DIAGNOSIS — Z6835 Body mass index (BMI) 35.0-35.9, adult: Secondary | ICD-10-CM

## 2020-02-03 DIAGNOSIS — Z9189 Other specified personal risk factors, not elsewhere classified: Secondary | ICD-10-CM

## 2020-02-03 DIAGNOSIS — F3289 Other specified depressive episodes: Secondary | ICD-10-CM

## 2020-02-03 DIAGNOSIS — E559 Vitamin D deficiency, unspecified: Secondary | ICD-10-CM

## 2020-02-03 MED ORDER — RYBELSUS 7 MG PO TABS: 7.0000 mg | ORAL_TABLET | Freq: Every day | ORAL | 0 refills | Status: DC

## 2020-02-03 MED ORDER — METFORMIN HCL 500 MG PO TABS: 500.0000 mg | ORAL_TABLET | Freq: Every day | ORAL | 0 refills | Status: DC

## 2020-02-03 MED ORDER — CONTRAVE 8-90 MG PO TB12: 2.0000 | ORAL_TABLET | Freq: Two times a day (BID) | ORAL | 0 refills | Status: DC

## 2020-02-03 MED ORDER — VITAMIN D (ERGOCALCIFEROL) 1.25 MG (50000 UNIT) PO CAPS: 50000.0000 [IU] | ORAL_CAPSULE | ORAL | 0 refills | Status: DC

## 2020-02-03 NOTE — Progress Notes (Signed)
Chief Complaint:   OBESITY Katelyn Walker is here to discuss her progress with her obesity treatment plan along with follow-up of her obesity related diagnoses. Katelyn Walker is on keeping a food journal and adhering to recommended goals of 1200 calories and 80 grams of protein and states she is following her eating plan approximately 90% of the time. Katelyn Walker states she is walking for 30 minutes 1 times per week.  Today's visit was #: 33 Starting weight: 242 lbs Starting date: 05/14/2018 Today's weight: 226 lbs Today's date: 02/03/2020 Total lbs lost to date: 16 lbs Total lbs lost since last in-office visit: 0  Interim History: Katelyn Walker says she is able to enjoy lots of fruits and vegetables.  She reports that it is hard to get protein in sometimes.  She is taking iron now and feels that it is helpful.  She is down another size.  Subjective:   1. Prediabetes Katelyn Walker has a diagnosis of prediabetes based on her elevated HgA1c and was informed this puts her at greater risk of developing diabetes. She continues to work on diet and exercise to decrease her risk of diabetes. She denies nausea or hypoglycemia.  She is taking metformin and Rybelsus.  Lab Results  Component Value Date   HGBA1C 5.6 01/04/2020   Lab Results  Component Value Date   INSULIN 21.6 01/04/2020   INSULIN 14.9 09/14/2019   INSULIN 17.7 03/19/2019   INSULIN 17.4 10/13/2018   INSULIN 18.3 05/14/2018   2. Vitamin D deficiency Katelyn Walker's Vitamin D level was 44.5 on 01/04/2020. She is currently taking vit D. She denies nausea, vomiting or muscle weakness.  3. Other iron deficiency anemia Katelyn Walker is not a vegetarian.  She does not have a history of weight loss surgery.  She is taking an iron supplement.  CBC Latest Ref Rng & Units 01/04/2020 05/14/2018 09/09/2007  WBC 3.4 - 10.8 x10E3/uL 9.3 8.7 13.7(H)  Hemoglobin 11.1 - 15.9 g/dL 10.9(L) 12.0 8.9 DELTA CHECK NOTED(L)  Hematocrit 34.0 - 46.6 % 34.3 38.8 26.6(L)  Platelets 150 -  450 x10E3/uL 341 - 197   Lab Results  Component Value Date   VITAMINB12 339 05/14/2018   4. Other depression, with emotional eating  Katelyn Walker is struggling with emotional eating and using food for comfort to the extent that it is negatively impacting her health. She has been working on behavior modification techniques to help reduce her emotional eating and has been unsuccessful. She shows no sign of suicidal or homicidal ideations.  She is taking Contrave.  5. At risk for complication associated with hypotension The patient is at a higher than average risk of hypotension due to weight loss.  Assessment/Plan:   1. Prediabetes Katelyn Walker will continue to work on weight loss, exercise, and decreasing simple carbohydrates to help decrease the risk of diabetes.   Orders - metFORMIN (GLUCOPHAGE) 500 MG tablet; Take 1 tablet (500 mg total) by mouth daily with breakfast.  Dispense: 30 tablet; Refill: 0 - Semaglutide (RYBELSUS) 7 MG TABS; Take 7 mg by mouth daily.  Dispense: 30 tablet; Refill: 0  2. Vitamin D deficiency Low Vitamin D level contributes to fatigue and are associated with obesity, breast, and colon cancer. She agrees to continue to take prescription Vitamin D @50 ,000 IU every week and will follow-up for routine testing of Vitamin D, at least 2-3 times per year to avoid over-replacement.  Orders - Vitamin D, Ergocalciferol, (DRISDOL) 1.25 MG (50000 UNIT) CAPS capsule; Take 1 capsule (50,000 Units total) by  mouth every 7 (seven) days.  Dispense: 4 capsule; Refill: 0  3. Other iron deficiency anemia Orders and follow up as documented in patient record.  Counseling . Iron is essential for our bodies to make red blood cells.  Reasons that someone may be deficient include: an iron-deficient diet (more likely in those following vegan or vegetarian diets), women with heavy menses, patients with GI disorders or poor absorption, patients that have had bariatric surgery, frequent blood donors,  patients with cancer, and patients with heart disease.   Katelyn Walker foods include dark leafy greens, red and white meats, eggs, seafood, and beans.   . Certain foods and drinks prevent your body from absorbing iron properly. Avoid eating these foods in the same meal as iron-rich foods or with iron supplements. These foods include: coffee, black tea, and red wine; milk, dairy products, and foods that are high in calcium; beans and soybeans; whole grains.  . Constipation can be a side effect of iron supplementation. Increased water and fiber intake are helpful. Water goal: > 2 liters/day. Fiber goal: > 25 grams/day.  4. Other depression, with emotional eating  Behavior modification techniques were discussed today to help Katelyn Walker deal with her emotional/non-hunger eating behaviors.  Orders and follow up as documented in patient record.   Orders - Naltrexone-buPROPion HCl ER (CONTRAVE) 8-90 MG TB12; Take 2 tablets by mouth 2 (two) times daily.  Dispense: 120 tablet; Refill: 0  5. At risk for complication associated with hypotension Katelyn Walker was given approximately 15 minutes of education and counseling today to help avoid hypotension. We discussed risks of hypotension with weight loss and signs of hypotension such as feeling lightheaded or unsteady.  Repetitive spaced learning was employed today to elicit superior memory formation and behavioral change.  6. Class 2 severe obesity with serious comorbidity and body mass index (BMI) of 35.0 to 35.9 in adult, unspecified obesity type (HCC) Katelyn Walker is currently in the action stage of change. As such, her goal is to continue with weight loss efforts. She has agreed to keeping a food journal and adhering to recommended goals of 1200 calories and 80 grams of protein.   Exercise goals: As is.  Behavioral modification strategies: increasing lean protein intake.  Katelyn Walker has agreed to follow-up with our clinic in 2 weeks. She was informed of the importance  of frequent follow-up visits to maximize her success with intensive lifestyle modifications for her multiple health conditions.   Objective:   Blood pressure 113/73, pulse 87, temperature 98.4 F (36.9 C), temperature source Oral, height 5\' 7"  (1.702 m), weight 226 lb (102.5 kg), SpO2 98 %. Body mass index is 35.4 kg/m.  General: Cooperative, alert, well developed, in no acute distress. HEENT: Conjunctivae and lids unremarkable. Cardiovascular: Regular rhythm.  Lungs: Normal work of breathing. Neurologic: No focal deficits.   Lab Results  Component Value Date   CREATININE 0.79 01/04/2020   BUN 9 01/04/2020   NA 139 01/04/2020   K 4.2 01/04/2020   CL 101 01/04/2020   CO2 23 01/04/2020   Lab Results  Component Value Date   ALT 6 01/04/2020   AST 10 01/04/2020   ALKPHOS 86 01/04/2020   BILITOT 0.2 01/04/2020   Lab Results  Component Value Date   HGBA1C 5.6 01/04/2020   HGBA1C 5.6 09/14/2019   HGBA1C 5.8 (H) 03/19/2019   HGBA1C 5.8 (H) 10/13/2018   HGBA1C 5.8 (H) 05/14/2018   Lab Results  Component Value Date   INSULIN 21.6 01/04/2020  INSULIN 14.9 09/14/2019   INSULIN 17.7 03/19/2019   INSULIN 17.4 10/13/2018   INSULIN 18.3 05/14/2018   Lab Results  Component Value Date   TSH 4.080 01/04/2020   Lab Results  Component Value Date   CHOL 168 01/04/2020   HDL 49 01/04/2020   LDLCALC 93 01/04/2020   TRIG 152 (H) 01/04/2020   Lab Results  Component Value Date   WBC 9.3 01/04/2020   HGB 10.9 (L) 01/04/2020   HCT 34.3 01/04/2020   MCV 74 (L) 01/04/2020   PLT 341 01/04/2020   Attestation Statements:   Reviewed by clinician on day of visit: allergies, medications, problem list, medical history, surgical history, family history, social history, and previous encounter notes.  I, Insurance claims handler, CMA, am acting as Energy manager for [employer], DO.  I have reviewed the above documentation for accuracy and completeness, and I agree with the above. Helane Rima, DO

## 2020-02-18 ENCOUNTER — Encounter (INDEPENDENT_AMBULATORY_CARE_PROVIDER_SITE_OTHER): Payer: Self-pay | Admitting: Family Medicine

## 2020-02-22 ENCOUNTER — Ambulatory Visit (INDEPENDENT_AMBULATORY_CARE_PROVIDER_SITE_OTHER): Payer: BC Managed Care – PPO | Admitting: Family Medicine

## 2020-02-22 ENCOUNTER — Encounter (INDEPENDENT_AMBULATORY_CARE_PROVIDER_SITE_OTHER): Payer: Self-pay | Admitting: Family Medicine

## 2020-02-22 ENCOUNTER — Other Ambulatory Visit: Payer: Self-pay

## 2020-02-22 VITALS — BP 114/76 | HR 79 | Temp 98.0°F | Ht 67.0 in | Wt 227.0 lb

## 2020-02-22 DIAGNOSIS — E559 Vitamin D deficiency, unspecified: Secondary | ICD-10-CM

## 2020-02-22 DIAGNOSIS — R7303 Prediabetes: Secondary | ICD-10-CM | POA: Diagnosis not present

## 2020-02-22 DIAGNOSIS — F3289 Other specified depressive episodes: Secondary | ICD-10-CM | POA: Diagnosis not present

## 2020-02-22 DIAGNOSIS — D508 Other iron deficiency anemias: Secondary | ICD-10-CM

## 2020-02-22 DIAGNOSIS — Z6835 Body mass index (BMI) 35.0-35.9, adult: Secondary | ICD-10-CM

## 2020-02-22 DIAGNOSIS — Z9189 Other specified personal risk factors, not elsewhere classified: Secondary | ICD-10-CM | POA: Diagnosis not present

## 2020-02-22 NOTE — Progress Notes (Signed)
Chief Complaint:   OBESITY Katelyn Walker is here to discuss her progress with her obesity treatment plan along with follow-up of her obesity related diagnoses. Katelyn Walker is on keeping a food journal and adhering to recommended goals of 1200 calories and 80 grams of protein and states she is following her eating plan approximately 75% of the time. Katelyn Walker states she is walking for 45 minutes 1 times per week.  Today's visit was #: 34 Starting weight: 242 lbs Starting date: 05/14/2018 Today's weight: 227 lbs Today's date: 02/22/2020 Total lbs lost to date: 15 lbs Total lbs lost since last in-office visit: 0  Interim History: [name] says she has been celebrating and emotional eating.  She has been busier at work. She is having her menses. Her father-in-law brought cake to dinner.  She says she is still craving sweets.  Subjective:   1. Other iron deficiency anemia [name] is not a vegetarian.  She does not have a history of weight loss surgery.   CBC Latest Ref Rng & Units 01/04/2020 05/14/2018 09/09/2007  WBC 3.4 - 10.8 x10E3/uL 9.3 8.7 13.7(H)  Hemoglobin 11.1 - 15.9 g/dL 10.9(L) 12.0 8.9 DELTA CHECK NOTED(L)  Hematocrit 34.0 - 46.6 % 34.3 38.8 26.6(L)  Platelets 150 - 450 x10E3/uL 341 - 197   Lab Results  Component Value Date   VITAMINB12 339 05/14/2018   2. Vitamin D deficiency Katelyn Walker's Vitamin D level was 44.5 on 01/04/2020. She is currently taking vit D. She denies nausea, vomiting or muscle weakness.  3. Prediabetes Katelyn Walker has a diagnosis of prediabetes based on her elevated HgA1c and was informed this puts her at greater risk of developing diabetes. She continues to work on diet and exercise to decrease her risk of diabetes. She denies nausea or hypoglycemia.  She is taking metformin and Rybelsus.  Lab Results  Component Value Date   HGBA1C 5.6 01/04/2020   Lab Results  Component Value Date   INSULIN 21.6 01/04/2020   INSULIN 14.9 09/14/2019   INSULIN 17.7 03/19/2019   INSULIN 17.4 10/13/2018   INSULIN 18.3 05/14/2018   4. Other depression, with emotional eating  Katelyn Walker is struggling with emotional eating and using food for comfort to the extent that it is negatively impacting her health. She has been working on behavior modification techniques to help reduce her emotional eating and has been unsuccessful. She shows no sign of suicidal or homicidal ideations.  She is taking Contrave and trazodone.  5. At risk for diabetes mellitus Katelyn Walker is at higher than average risk for developing diabetes due to her obesity.   Assessment/Plan:   1. Other iron deficiency anemia Orders and follow up as documented in patient record.  Counseling . Iron is essential for our bodies to make red blood cells.  Reasons that someone may be deficient include: an iron-deficient diet (more likely in those following vegan or vegetarian diets), women with heavy menses, patients with GI disorders or poor absorption, patients that have had bariatric surgery, frequent blood donors, patients with cancer, and patients with heart disease.   [name] foods include dark leafy greens, red and white meats, eggs, seafood, and beans.   . Certain foods and drinks prevent your body from absorbing iron properly. Avoid eating these foods in the same meal as iron-rich foods or with iron supplements. These foods include: coffee, black tea, and red wine; milk, dairy products, and foods that are high in calcium; beans and soybeans; whole grains.  . Constipation can be a  side effect of iron supplementation. Increased water and fiber intake are helpful. Water goal: > 2 liters/day. Fiber goal: > 25 grams/day.  2. Vitamin D deficiency Low Vitamin D level contributes to fatigue and are associated with obesity, breast, and colon cancer. She agrees to continue to take prescription Vitamin D @50 ,000 IU every week and will follow-up for routine testing of Vitamin D, at least 2-3 times per year to avoid  over-replacement.  3. Prediabetes Katelyn Walker will continue to work on weight loss, exercise, and decreasing simple carbohydrates to help decrease the risk of diabetes.   4. Other depression, with emotional eating  Behavior modification techniques were discussed today to help Darrion deal with her emotional/non-hunger eating behaviors.  Orders and follow up as documented in patient record.   5. At risk for diabetes mellitus Katelyn Walker was given approximately 15 minutes of diabetes education and counseling today. We discussed intensive lifestyle modifications today with an emphasis on weight loss as well as increasing exercise and decreasing simple carbohydrates in her diet. We also reviewed medication options with an emphasis on risk versus benefit of those discussed.   Repetitive spaced learning was employed today to elicit superior memory formation and behavioral change.  6. Class 2 severe obesity with serious comorbidity and body mass index (BMI) of 35.0 to 35.9 in adult, unspecified obesity type (HCC) Katelyn Walker is currently in the action stage of change. As such, her goal is to continue with weight loss efforts. She has agreed to keeping a food journal and adhering to recommended goals of 1200 calories and 80 grams of protein.   Exercise goals: For substantial health benefits, adults should do at least 150 minutes (2 hours and 30 minutes) a week of moderate-intensity, or 75 minutes (1 hour and 15 minutes) a week of vigorous-intensity aerobic physical activity, or an equivalent combination of moderate- and vigorous-intensity aerobic activity. Aerobic activity should be performed in episodes of at least 10 minutes, and preferably, it should be spread throughout the week.  Behavioral modification strategies: increasing lean protein intake, emotional eating strategies and dealing with family or coworker sabotage.  Katelyn Walker has agreed to follow-up with our clinic in 2 weeks. She was informed of the  importance of frequent follow-up visits to maximize her success with intensive lifestyle modifications for her multiple health conditions.   Objective:   Blood pressure 114/76, pulse 79, temperature 98 F (36.7 C), temperature source Oral, height 5\' 7"  (1.702 m), weight 227 lb (103 kg), SpO2 98 %. Body mass index is 35.55 kg/m.  General: Cooperative, alert, well developed, in no acute distress. HEENT: Conjunctivae and lids unremarkable. Cardiovascular: Regular rhythm.  Lungs: Normal work of breathing. Neurologic: No focal deficits.   Lab Results  Component Value Date   CREATININE 0.79 01/04/2020   BUN 9 01/04/2020   NA 139 01/04/2020   K 4.2 01/04/2020   CL 101 01/04/2020   CO2 23 01/04/2020   Lab Results  Component Value Date   ALT 6 01/04/2020   AST 10 01/04/2020   ALKPHOS 86 01/04/2020   BILITOT 0.2 01/04/2020   Lab Results  Component Value Date   HGBA1C 5.6 01/04/2020   HGBA1C 5.6 09/14/2019   HGBA1C 5.8 (H) 03/19/2019   HGBA1C 5.8 (H) 10/13/2018   HGBA1C 5.8 (H) 05/14/2018   Lab Results  Component Value Date   INSULIN 21.6 01/04/2020   INSULIN 14.9 09/14/2019   INSULIN 17.7 03/19/2019   INSULIN 17.4 10/13/2018   INSULIN 18.3 05/14/2018   Lab Results  Component Value Date   TSH 4.080 01/04/2020   Lab Results  Component Value Date   CHOL 168 01/04/2020   HDL 49 01/04/2020   LDLCALC 93 01/04/2020   TRIG 152 (H) 01/04/2020   Lab Results  Component Value Date   WBC 9.3 01/04/2020   HGB 10.9 (L) 01/04/2020   HCT 34.3 01/04/2020   MCV 74 (L) 01/04/2020   PLT 341 01/04/2020   Attestation Statements:   Reviewed by clinician on day of visit: allergies, medications, problem list, medical history, surgical history, family history, social history, and previous encounter notes.  I, Water quality scientist, CMA, am acting as Location manager for [employer], DO.  I have reviewed the above documentation for accuracy and completeness, and I agree with the above. Briscoe Deutscher, DO

## 2020-03-09 ENCOUNTER — Other Ambulatory Visit (INDEPENDENT_AMBULATORY_CARE_PROVIDER_SITE_OTHER): Payer: Self-pay | Admitting: Family Medicine

## 2020-03-09 DIAGNOSIS — G4709 Other insomnia: Secondary | ICD-10-CM

## 2020-03-10 ENCOUNTER — Other Ambulatory Visit (INDEPENDENT_AMBULATORY_CARE_PROVIDER_SITE_OTHER): Payer: Self-pay | Admitting: Family Medicine

## 2020-03-10 DIAGNOSIS — F3289 Other specified depressive episodes: Secondary | ICD-10-CM

## 2020-03-14 ENCOUNTER — Other Ambulatory Visit: Payer: Self-pay

## 2020-03-14 ENCOUNTER — Encounter (INDEPENDENT_AMBULATORY_CARE_PROVIDER_SITE_OTHER): Payer: Self-pay | Admitting: Family Medicine

## 2020-03-14 ENCOUNTER — Ambulatory Visit (INDEPENDENT_AMBULATORY_CARE_PROVIDER_SITE_OTHER): Payer: BC Managed Care – PPO | Admitting: Family Medicine

## 2020-03-14 VITALS — BP 114/74 | HR 68 | Temp 97.8°F | Ht 67.0 in | Wt 227.0 lb

## 2020-03-14 DIAGNOSIS — F3289 Other specified depressive episodes: Secondary | ICD-10-CM | POA: Diagnosis not present

## 2020-03-14 DIAGNOSIS — R7303 Prediabetes: Secondary | ICD-10-CM | POA: Diagnosis not present

## 2020-03-14 DIAGNOSIS — G4709 Other insomnia: Secondary | ICD-10-CM | POA: Diagnosis not present

## 2020-03-14 DIAGNOSIS — R0602 Shortness of breath: Secondary | ICD-10-CM

## 2020-03-14 DIAGNOSIS — Z9189 Other specified personal risk factors, not elsewhere classified: Secondary | ICD-10-CM | POA: Diagnosis not present

## 2020-03-14 DIAGNOSIS — Z6835 Body mass index (BMI) 35.0-35.9, adult: Secondary | ICD-10-CM

## 2020-03-14 MED ORDER — TRAZODONE HCL 50 MG PO TABS: 25.0000 mg | ORAL_TABLET | Freq: Every evening | ORAL | 0 refills | Status: DC | PRN

## 2020-03-14 MED ORDER — CONTRAVE 8-90 MG PO TB12: 2.0000 | ORAL_TABLET | Freq: Two times a day (BID) | ORAL | 0 refills | Status: DC

## 2020-03-14 NOTE — Progress Notes (Signed)
Chief Complaint:   OBESITY Katelyn Walker is here to discuss her progress with her obesity treatment plan along with follow-up of her obesity related diagnoses. Katelyn Walker is on the Category 2 Plan and states she is following her eating plan approximately 80% of the time. Katelyn Walker states she is swimming for 30 minutes 4 times per week.  Today's visit was #: 35 Starting weight: 242 lbs Starting date: 05/14/2018 Today's weight: 227 lbs Today's date: 03/14/2020 Total lbs lost to date: 15 lbs Total lbs lost since last in-office visit: 0  Interim History: Katelyn Walker has started swimming and says she is definitely feeling the benefit.  She denies polyphagia.   IC completed today. Her RMR has increased from 1670 to 1960 since being on her weight management plan, despite losing 15 pounds. She is excited about this. She has been logging her meals in [employer]fitness[employer] and printed a 2 week review, which we went over together. Average calories are around 1200/day. Struggling to meet protein goals each day. She admits that she forgot to add sweet tea to her log some days.   Subjective:   1. Prediabetes Katelyn Walker has a diagnosis of prediabetes based on her elevated HgA1c and was informed this puts her at greater risk of developing diabetes. She continues to work on diet and exercise to decrease her risk of diabetes. She denies nausea or hypoglycemia.  Lab Results  Component Value Date   HGBA1C 5.6 01/04/2020   Lab Results  Component Value Date   INSULIN 21.6 01/04/2020   INSULIN 14.9 09/14/2019   INSULIN 17.7 03/19/2019   INSULIN 17.4 10/13/2018   INSULIN 18.3 05/14/2018   2. Other insomnia Katelyn Walker has insomnia and is taking trazodone with relief.  3. Other depression, with emotional eating  Katelyn Walker is struggling with emotional eating and using food for comfort to the extent that it is negatively impacting her health. She has been working on behavior modification techniques to help reduce her emotional eating  and has been successful.   Assessment/Plan:   1. Prediabetes Katelyn Walker will continue to work on weight loss, exercise, and decreasing simple carbohydrates to help decrease the risk of diabetes.   2. Other insomnia Well controlled.  No signs of complications, medication side effects, or red flags.  Continue current regimen.  Orders and follow up as documented in patient record.   Counseling  Limit or avoid alcohol, caffeinated beverages, and cigarettes, especially close to bedtime.   Do not eat a large meal or eat spicy foods right before bedtime. This can lead to digestive discomfort that can make it hard for you to sleep.  Keep a sleep diary to help you and your health care provider figure out what could be causing your insomnia.  . Make your bedroom a dark, comfortable place where it is easy to fall asleep. ? Put up shades or blackout curtains to block light from outside. ? Use a white noise machine to block noise. ? Keep the temperature cool. . Limit screen use before bedtime. This includes: ? Watching TV. ? Using your smartphone, tablet, or computer. . Stick to a routine that includes going to bed and waking up at the same times every day and night. This can help you fall asleep faster. Consider making a quiet activity, such as reading, part of your nighttime routine. . Try to avoid taking naps during the day so that you sleep better at night. . Get out of bed if you are still awake after 15 minutes  of trying to sleep. Keep the lights down, but try reading or doing a quiet activity. When you feel sleepy, go back to bed.  Orders - traZODone (DESYREL) 50 MG tablet; Take 0.5-1 tablets (25-50 mg total) by mouth at bedtime as needed for sleep.  Dispense: 30 tablet; Refill: 0  3. Other depression, with emotional eating  Behavior modification techniques were discussed today to help Katelyn Walker deal with her emotional/non-hunger eating behaviors.  Orders and follow up as documented in patient  record.   4. At risk for diabetes mellitus Katelyn Walker was given approximately 15 minutes of diabetes education and counseling today. We discussed intensive lifestyle modifications today with an emphasis on weight loss as well as increasing exercise and decreasing simple carbohydrates in her diet. We also reviewed medication options with an emphasis on risk versus benefit of those discussed.   Repetitive spaced learning was employed today to elicit superior memory formation and behavioral change.  5. Class 2 severe obesity with serious comorbidity and body mass index (BMI) of 35.0 to 35.9 in adult, unspecified obesity type (HCC)  Orders - Naltrexone-buPROPion HCl ER (CONTRAVE) 8-90 MG TB12; Take 2 tablets by mouth 2 (two) times daily.  Dispense: 120 tablet; Refill: 0  Katelyn Walker is currently in the action stage of change. As such, her goal is to continue with weight loss efforts. She has agreed to keeping a food journal and adhering to recommended goals of 1200-1400 calories and 80-95 grams of protein.  Okay +200 calorie snacks.  Exercise goals: As is.  Behavioral modification strategies: increasing lean protein intake and increasing water intake.    Katelyn Walker has agreed to follow-up with our clinic in 2 weeks. She was informed of the importance of frequent follow-up visits to maximize her success with intensive lifestyle modifications for her multiple health conditions.   Objective:   Blood pressure 114/74, pulse 68, temperature 97.8 F (36.6 C), temperature source Oral, height 5\' 7"  (1.702 m), weight 227 lb (103 kg), last menstrual period 02/14/2020, SpO2 99 %. Body mass index is 35.55 kg/m.  General: Cooperative, alert, well developed, in no acute distress. HEENT: Conjunctivae and lids unremarkable. Cardiovascular: Regular rhythm.  Lungs: Normal work of breathing. Neurologic: No focal deficits.   Lab Results  Component Value Date   CREATININE 0.79 01/04/2020   BUN 9 01/04/2020   NA 139  01/04/2020   K 4.2 01/04/2020   CL 101 01/04/2020   CO2 23 01/04/2020   Lab Results  Component Value Date   ALT 6 01/04/2020   AST 10 01/04/2020   ALKPHOS 86 01/04/2020   BILITOT 0.2 01/04/2020   Lab Results  Component Value Date   HGBA1C 5.6 01/04/2020   HGBA1C 5.6 09/14/2019   HGBA1C 5.8 (H) 03/19/2019   HGBA1C 5.8 (H) 10/13/2018   HGBA1C 5.8 (H) 05/14/2018   Lab Results  Component Value Date   INSULIN 21.6 01/04/2020   INSULIN 14.9 09/14/2019   INSULIN 17.7 03/19/2019   INSULIN 17.4 10/13/2018   INSULIN 18.3 05/14/2018   Lab Results  Component Value Date   TSH 4.080 01/04/2020   Lab Results  Component Value Date   CHOL 168 01/04/2020   HDL 49 01/04/2020   LDLCALC 93 01/04/2020   TRIG 152 (H) 01/04/2020   Lab Results  Component Value Date   WBC 9.3 01/04/2020   HGB 10.9 (L) 01/04/2020   HCT 34.3 01/04/2020   MCV 74 (L) 01/04/2020   PLT 341 01/04/2020   Attestation Statements:   Reviewed  by clinician on day of visit: allergies, medications, problem list, medical history, surgical history, family history, social history, and previous encounter notes.  I, Water quality scientist, CMA, am acting as Location manager for [employer], DO.  I have reviewed the above documentation for accuracy and completeness, and I agree with the above. Briscoe Deutscher, DO

## 2020-03-22 ENCOUNTER — Ambulatory Visit (INDEPENDENT_AMBULATORY_CARE_PROVIDER_SITE_OTHER): Payer: BC Managed Care – PPO | Admitting: Family Medicine

## 2020-03-28 ENCOUNTER — Other Ambulatory Visit: Payer: Self-pay

## 2020-03-28 ENCOUNTER — Encounter (INDEPENDENT_AMBULATORY_CARE_PROVIDER_SITE_OTHER): Payer: Self-pay | Admitting: Family Medicine

## 2020-03-28 ENCOUNTER — Ambulatory Visit (INDEPENDENT_AMBULATORY_CARE_PROVIDER_SITE_OTHER): Payer: BC Managed Care – PPO | Admitting: Family Medicine

## 2020-03-28 VITALS — BP 115/76 | HR 86 | Temp 97.9°F | Ht 67.0 in | Wt 228.0 lb

## 2020-03-28 DIAGNOSIS — D508 Other iron deficiency anemias: Secondary | ICD-10-CM | POA: Diagnosis not present

## 2020-03-28 DIAGNOSIS — D649 Anemia, unspecified: Secondary | ICD-10-CM | POA: Insufficient documentation

## 2020-03-28 DIAGNOSIS — Z9189 Other specified personal risk factors, not elsewhere classified: Secondary | ICD-10-CM | POA: Diagnosis not present

## 2020-03-28 DIAGNOSIS — R7303 Prediabetes: Secondary | ICD-10-CM | POA: Diagnosis not present

## 2020-03-28 DIAGNOSIS — E559 Vitamin D deficiency, unspecified: Secondary | ICD-10-CM

## 2020-03-28 DIAGNOSIS — E66812 Obesity, class 2: Secondary | ICD-10-CM

## 2020-03-28 DIAGNOSIS — Z6835 Body mass index (BMI) 35.0-35.9, adult: Secondary | ICD-10-CM

## 2020-03-28 DIAGNOSIS — F3289 Other specified depressive episodes: Secondary | ICD-10-CM

## 2020-03-28 DIAGNOSIS — K2100 Gastro-esophageal reflux disease with esophagitis, without bleeding: Secondary | ICD-10-CM | POA: Diagnosis not present

## 2020-03-28 MED ORDER — VITAMIN D (ERGOCALCIFEROL) 1.25 MG (50000 UNIT) PO CAPS: 50000.0000 [IU] | ORAL_CAPSULE | ORAL | 0 refills | Status: DC

## 2020-03-28 NOTE — Progress Notes (Signed)
Chief Complaint:   OBESITY Katelyn Walker is here to discuss her progress with her obesity treatment plan along with follow-up of her obesity related diagnoses. Katelyn Walker is on keeping a food journal and adhering to recommended goals of 1200 calories and 80 grams of protein and states she is following her eating plan approximately 90% of the time. Katelyn Walker states she is swimming 40 minutes 2 times per week.  Today's visit was #: 15 Starting weight: 242 lbs Starting date: 05/14/2018 Today's weight: 228 lbs Today's date: 03/28/2020 Total lbs lost to date: 14 lbs Total lbs lost since last in-office visit: 0  Interim History: Katelyn Walker says she is doing better with sweet tea.  She is drinking half sweet/half unsweet, 1 a day with dinner.  She has been swimming more.  She increased Contrave to 2 tablets in the morning. She noticing a little more dyspepsia in the morning.  Subjective:   1. Prediabetes Katelyn Walker has a diagnosis of prediabetes based on her elevated HgA1c and was informed this puts her at greater risk of developing diabetes. She continues to work on diet and exercise to decrease her risk of diabetes. She denies nausea or hypoglycemia.  She takes metformin 500 mg daily.  Lab Results  Component Value Date   HGBA1C 5.6 01/04/2020   Lab Results  Component Value Date   INSULIN 21.6 01/04/2020   INSULIN 14.9 09/14/2019   INSULIN 17.7 03/19/2019   INSULIN 17.4 10/13/2018   INSULIN 18.3 05/14/2018   2. Vitamin D deficiency Katelyn Walker's Vitamin D level was 44.5 on 01/04/2020. She is currently taking prescription vitamin D 50,000 IU each week. She denies nausea, vomiting or muscle weakness.  3. Other iron deficiency anemia Katelyn Walker is not a vegetarian.  She does not have a history of weight loss surgery.  She is taking an oral iron supplement.  CBC Latest Ref Rng & Units 01/04/2020 05/14/2018 09/09/2007  WBC 3.4 - 10.8 x10E3/uL 9.3 8.7 13.7(H)  Hemoglobin 11.1 - 15.9 g/dL 10.9(L) 12.0 8.9 DELTA  CHECK NOTED(L)  Hematocrit 34.0 - 46.6 % 34.3 38.8 26.6(L)  Platelets 150 - 450 x10E3/uL 341 - 197   Lab Results  Component Value Date   VITAMINB12 339 05/14/2018   4. Gastroesophageal reflux disease with esophagitis without hemorrhage She has been experiencing dyspepsia.  She has history of Barrett's esophagus, hiatal hernia, esophagitis, and stricture.  She is followed by Dr. Earlean Shawl (EGD around 5-6 years ago).  5. Other depression, with emotional eating  Katelyn Walker is struggling with emotional eating and using food for comfort to the extent that it is negatively impacting her health. She has been working on behavior modification techniques to help reduce her emotional eating and has been unsuccessful. She shows no sign of suicidal or homicidal ideations.  She is taking Contrave and Rybelsus.  Assessment/Plan:   1. Prediabetes Katelyn Walker will continue to work on weight loss, exercise, and decreasing simple carbohydrates to help decrease the risk of diabetes.   2. Vitamin D deficiency Low Vitamin D level contributes to fatigue and are associated with obesity, breast, and colon cancer. She agrees to continue to take prescription Vitamin D @50 ,000 IU every week and will follow-up for routine testing of Vitamin D, at least 2-3 times per year to avoid over-replacement.  Orders - Vitamin D, Ergocalciferol, (DRISDOL) 1.25 MG (50000 UNIT) CAPS capsule; Take 1 capsule (50,000 Units total) by mouth every 7 (seven) days.  Dispense: 4 capsule; Refill: 0  3. Other iron deficiency anemia Orders  and follow up as documented in patient record.  Counseling . Iron is essential for our bodies to make red blood cells.  Reasons that someone may be deficient include: an iron-deficient diet (more likely in those following vegan or vegetarian diets), women with heavy menses, patients with GI disorders or poor absorption, patients that have had bariatric surgery, frequent blood donors, patients with cancer, and  patients with heart disease.   [name] foods include dark leafy greens, red and white meats, eggs, seafood, and beans.   . Certain foods and drinks prevent your body from absorbing iron properly. Avoid eating these foods in the same meal as iron-rich foods or with iron supplements. These foods include: coffee, black tea, and red wine; milk, dairy products, and foods that are high in calcium; beans and soybeans; whole grains.  . Constipation can be a side effect of iron supplementation. Increased water and fiber intake are helpful. Water goal: > 2 liters/day. Fiber goal: > 25 grams/day.  4. Gastroesophageal reflux disease with esophagitis without hemorrhage Stop Rybelsus, continue PPI.   5. Other depression, with emotional eating  Behavior modification techniques were discussed today to help Katelyn Walker deal with her emotional/non-hunger eating behaviors.  Orders and follow up as documented in patient record.   6. At risk for nausea Katelyn Walker was given approximately 15 minutes of nausea prevention counseling today. Katelyn Walker is at risk for nausea due to her new or current medication. She was encouraged to titrate her medication slowly, make sure to stay hydrated, eat smaller portions throughout the day, and avoid high fat meals.   7. Class 2 severe obesity with serious comorbidity and body mass index (BMI) of 35.0 to 35.9 in adult, unspecified obesity type (HCC) Katelyn Walker is currently in the action stage of change. As such, her goal is to continue with weight loss efforts. She has agreed to keeping a food journal and adhering to recommended goals of 1200 calories and 80 grams of protein daily.   Exercise goals: As is.  Behavioral modification strategies: increasing water intake.  Katelyn Walker has agreed to follow-up with our clinic in 2 weeks. She was informed of the importance of frequent follow-up visits to maximize her success with intensive lifestyle modifications for her multiple health  conditions.   Objective:   Blood pressure 115/76, pulse 86, temperature 97.9 F (36.6 C), temperature source Oral, height 5\' 7"  (1.702 m), weight 228 lb (103.4 kg), last menstrual period 03/20/2020, SpO2 98 %. Body mass index is 35.71 kg/m.  General: Cooperative, alert, well developed, in no acute distress. HEENT: Conjunctivae and lids unremarkable. Cardiovascular: Regular rhythm.  Lungs: Normal work of breathing. Neurologic: No focal deficits.   Lab Results  Component Value Date   CREATININE 0.79 01/04/2020   BUN 9 01/04/2020   NA 139 01/04/2020   K 4.2 01/04/2020   CL 101 01/04/2020   CO2 23 01/04/2020   Lab Results  Component Value Date   ALT 6 01/04/2020   AST 10 01/04/2020   ALKPHOS 86 01/04/2020   BILITOT 0.2 01/04/2020   Lab Results  Component Value Date   HGBA1C 5.6 01/04/2020   HGBA1C 5.6 09/14/2019   HGBA1C 5.8 (H) 03/19/2019   HGBA1C 5.8 (H) 10/13/2018   HGBA1C 5.8 (H) 05/14/2018   Lab Results  Component Value Date   INSULIN 21.6 01/04/2020   INSULIN 14.9 09/14/2019   INSULIN 17.7 03/19/2019   INSULIN 17.4 10/13/2018   INSULIN 18.3 05/14/2018   Lab Results  Component Value  Date   TSH 4.080 01/04/2020   Lab Results  Component Value Date   CHOL 168 01/04/2020   HDL 49 01/04/2020   LDLCALC 93 01/04/2020   TRIG 152 (H) 01/04/2020   Lab Results  Component Value Date   WBC 9.3 01/04/2020   HGB 10.9 (L) 01/04/2020   HCT 34.3 01/04/2020   MCV 74 (L) 01/04/2020   PLT 341 01/04/2020   Attestation Statements:   Reviewed by clinician on day of visit: allergies, medications, problem list, medical history, surgical history, family history, social history, and previous encounter notes.  I, Insurance claims handler, CMA, am acting as Energy manager for [employer], DO.  I have reviewed the above documentation for accuracy and completeness, and I agree with the above. Helane Rima, DO

## 2020-04-04 DIAGNOSIS — Z01419 Encounter for gynecological examination (general) (routine) without abnormal findings: Secondary | ICD-10-CM | POA: Diagnosis not present

## 2020-04-04 DIAGNOSIS — Z1231 Encounter for screening mammogram for malignant neoplasm of breast: Secondary | ICD-10-CM | POA: Diagnosis not present

## 2020-04-04 DIAGNOSIS — Z6836 Body mass index (BMI) 36.0-36.9, adult: Secondary | ICD-10-CM | POA: Diagnosis not present

## 2020-04-04 DIAGNOSIS — Z13820 Encounter for screening for osteoporosis: Secondary | ICD-10-CM | POA: Diagnosis not present

## 2020-04-19 ENCOUNTER — Other Ambulatory Visit: Payer: Self-pay

## 2020-04-19 ENCOUNTER — Encounter (INDEPENDENT_AMBULATORY_CARE_PROVIDER_SITE_OTHER): Payer: Self-pay | Admitting: Family Medicine

## 2020-04-19 ENCOUNTER — Ambulatory Visit (INDEPENDENT_AMBULATORY_CARE_PROVIDER_SITE_OTHER): Payer: BC Managed Care – PPO | Admitting: Family Medicine

## 2020-04-19 VITALS — BP 123/78 | HR 78 | Temp 97.9°F | Ht 67.0 in | Wt 230.0 lb

## 2020-04-19 DIAGNOSIS — Z9189 Other specified personal risk factors, not elsewhere classified: Secondary | ICD-10-CM | POA: Diagnosis not present

## 2020-04-19 DIAGNOSIS — G4709 Other insomnia: Secondary | ICD-10-CM

## 2020-04-19 DIAGNOSIS — F3289 Other specified depressive episodes: Secondary | ICD-10-CM

## 2020-04-19 DIAGNOSIS — E559 Vitamin D deficiency, unspecified: Secondary | ICD-10-CM

## 2020-04-19 DIAGNOSIS — D508 Other iron deficiency anemias: Secondary | ICD-10-CM | POA: Diagnosis not present

## 2020-04-19 DIAGNOSIS — Z6836 Body mass index (BMI) 36.0-36.9, adult: Secondary | ICD-10-CM

## 2020-04-19 MED ORDER — TRAZODONE HCL 50 MG PO TABS: 25.0000 mg | ORAL_TABLET | Freq: Every evening | ORAL | 0 refills | Status: DC | PRN

## 2020-04-19 MED ORDER — VITAMIN D (ERGOCALCIFEROL) 1.25 MG (50000 UNIT) PO CAPS: 50000.0000 [IU] | ORAL_CAPSULE | ORAL | 0 refills | Status: DC

## 2020-04-19 MED ORDER — PHENTERMINE HCL 15 MG PO CAPS: 15.0000 mg | ORAL_CAPSULE | ORAL | 0 refills | Status: DC

## 2020-04-19 NOTE — Progress Notes (Addendum)
Chief Complaint:   OBESITY Katelyn Walker is here to discuss her progress with her obesity treatment plan along with follow-up of her obesity related diagnoses. Katelyn Walker is on keeping a food journal and adhering to recommended goals of 1200 calories and 80 grams of protein and states she is following her eating plan approximately 70% of the time. Katelyn Walker states she is swimming for 40 minutes 2 times per week and walking for 30 minutes 2 times per week.  Today's visit was #: 73 Starting weight: 242 lbs Starting date: 05/14/2018 Today's weight: 230 lbs Today's date: 04/19/2020 Total lbs lost to date: 12 lbs Total lbs lost since last in-office visit: 0  Interim History: Today's bioimpedance results indicate that Katelyn Walker has gained 4 pounds of water weight since her last visit. She has increased her swimming.  She says her appetite has increased since stopping Rybelsus, but her nausea/dyspepsia have improved.  Subjective:   1. Vitamin D deficiency Katelyn Walker's Vitamin D level was 44.5 on 01/04/2020. She is currently taking prescription vitamin D 50,000 IU each week. She denies nausea, vomiting or muscle weakness.  Vitamin D is at goal with current treatment.  2. Other insomnia Katelyn Walker is taking trazodone as needed for help with insomnia. She has found this helpful and denies side effects.  3. Other iron deficiency anemia CBC Latest Ref Rng & Units 01/04/2020 05/14/2018 09/09/2007  WBC 3.4 - 10.8 x10E3/uL 9.3 8.7 13.7(H)  Hemoglobin 11.1 - 15.9 g/dL 10.9(L) 12.0 8.9 DELTA CHECK NOTED(L)  Hematocrit 34.0 - 46.6 % 34.3 38.8 26.6(L)  Platelets 150 - 450 x10E3/uL 341 - 197   4. Other depression, with emotional eating  Katelyn Walker is struggling with emotional eating and using food for comfort to the extent that it is negatively impacting her health. She has been working on behavior modification techniques to help reduce her emotional eating and has been successful. She shows no sign of suicidal or homicidal  ideations.  She is taking Contrave and was taking Rybelsus, but stopped Rybelsus due to dyspepsia/nausea.  5. At risk for constipation Katelyn Walker is at increased risk for constipation due to inadequate water intake, changes in diet, and/or use of medications such as GLP1 agonists. Katelyn Walker denies hard, infrequent stools currently.   Assessment/Plan:   1. Vitamin D deficiency Low Vitamin D level contributes to fatigue and are associated with obesity, breast, and colon cancer. She agrees to continue to take prescription Vitamin D @50 ,000 IU every week and will follow-up for routine testing of Vitamin D, at least 2-3 times per year to avoid over-replacement.  Orders - Vitamin D, Ergocalciferol, (DRISDOL) 1.25 MG (50000 UNIT) CAPS capsule; Take 1 capsule (50,000 Units total) by mouth every 7 (seven) days.  Dispense: 4 capsule; Refill: 0  2. Other insomnia Currently controlled. Orders and follow up as documented in patient record. This issue directly impacts care plan for optimization of BMI and metabolic health as it impacts the patient's ability to make lifestyle changes.  Orders - traZODone (DESYREL) 50 MG tablet; Take 0.5-1 tablets (25-50 mg total) by mouth at bedtime as needed for sleep.  Dispense: 30 tablet; Refill: 0  3. Other iron deficiency anemia Lab results reviewed with patient. We will continue to monitor. Orders and follow up as documented in patient record.  4. Other depression, with emotional eating  Behavior modification techniques were discussed today to help Katelyn Walker deal with her emotional/non-hunger eating behaviors.  Orders and follow up as documented in patient record.   Orders -  phentermine 15 MG capsule; Take 1 capsule (15 mg total) by mouth every morning.  Dispense: 30 capsule; Refill: 0  5. At risk for constipation Katelyn Walker was given approximately 15 minutes of counseling today regarding prevention of constipation. She was encouraged to increase water and fiber intake.    6. Class 2 severe obesity with serious comorbidity and body mass index (BMI) of 36.0 to 36.9 in adult, unspecified obesity type (HCC) Katelyn Walker is currently in the action stage of change. As such, her goal is to continue with weight loss efforts. She has agreed to keeping a food journal and adhering to recommended goals of 1200 calories and 80 grams of protein.   Exercise goals: As is.  Behavioral modification strategies: increasing lean protein intake, decreasing simple carbohydrates, increasing vegetables and increasing water intake.  Katelyn Walker has agreed to follow-up with our clinic in 2 weeks. She was informed of the importance of frequent follow-up visits to maximize her success with intensive lifestyle modifications for her multiple health conditions.   Objective:   Blood pressure 123/78, pulse 78, temperature 97.9 F (36.6 C), temperature source Oral, height 5\' 7"  (1.702 m), weight 230 lb (104.3 kg), last menstrual period 03/20/2020, SpO2 98 %. Body mass index is 36.02 kg/m.  General: Cooperative, alert, well developed, in no acute distress. HEENT: Conjunctivae and lids unremarkable. Cardiovascular: Regular rhythm.  Lungs: Normal work of breathing. Neurologic: No focal deficits.   Lab Results  Component Value Date   CREATININE 0.79 01/04/2020   BUN 9 01/04/2020   NA 139 01/04/2020   K 4.2 01/04/2020   CL 101 01/04/2020   CO2 23 01/04/2020   Lab Results  Component Value Date   ALT 6 01/04/2020   AST 10 01/04/2020   ALKPHOS 86 01/04/2020   BILITOT 0.2 01/04/2020   Lab Results  Component Value Date   HGBA1C 5.6 01/04/2020   HGBA1C 5.6 09/14/2019   HGBA1C 5.8 (H) 03/19/2019   HGBA1C 5.8 (H) 10/13/2018   HGBA1C 5.8 (H) 05/14/2018   Lab Results  Component Value Date   INSULIN 21.6 01/04/2020   INSULIN 14.9 09/14/2019   INSULIN 17.7 03/19/2019   INSULIN 17.4 10/13/2018   INSULIN 18.3 05/14/2018   Lab Results  Component Value Date   TSH 4.080 01/04/2020   Lab  Results  Component Value Date   CHOL 168 01/04/2020   HDL 49 01/04/2020   LDLCALC 93 01/04/2020   TRIG 152 (H) 01/04/2020   Lab Results  Component Value Date   WBC 9.3 01/04/2020   HGB 10.9 (L) 01/04/2020   HCT 34.3 01/04/2020   MCV 74 (L) 01/04/2020   PLT 341 01/04/2020   Attestation Statements:   Reviewed by clinician on day of visit: allergies, medications, problem list, medical history, surgical history, family history, social history, and previous encounter notes.  I, 01/06/2020, CMA, am acting as Insurance claims handler for Energy manager, DO.  I have reviewed the above documentation for accuracy and completeness, and I agree with the above. [employer], DO

## 2020-05-11 ENCOUNTER — Other Ambulatory Visit: Payer: Self-pay

## 2020-05-11 ENCOUNTER — Ambulatory Visit (INDEPENDENT_AMBULATORY_CARE_PROVIDER_SITE_OTHER): Payer: BC Managed Care – PPO | Admitting: Family Medicine

## 2020-05-11 ENCOUNTER — Encounter (INDEPENDENT_AMBULATORY_CARE_PROVIDER_SITE_OTHER): Payer: Self-pay | Admitting: Family Medicine

## 2020-05-11 VITALS — BP 128/71 | HR 70 | Temp 98.4°F | Ht 67.0 in | Wt 227.0 lb

## 2020-05-11 DIAGNOSIS — F3289 Other specified depressive episodes: Secondary | ICD-10-CM

## 2020-05-11 DIAGNOSIS — R7303 Prediabetes: Secondary | ICD-10-CM | POA: Diagnosis not present

## 2020-05-11 DIAGNOSIS — Z9189 Other specified personal risk factors, not elsewhere classified: Secondary | ICD-10-CM

## 2020-05-11 DIAGNOSIS — Z6835 Body mass index (BMI) 35.0-35.9, adult: Secondary | ICD-10-CM

## 2020-05-11 DIAGNOSIS — G4709 Other insomnia: Secondary | ICD-10-CM | POA: Diagnosis not present

## 2020-05-11 MED ORDER — METFORMIN HCL 500 MG PO TABS: 500.0000 mg | ORAL_TABLET | Freq: Every day | ORAL | 0 refills | Status: DC

## 2020-05-11 MED ORDER — TRAZODONE HCL 50 MG PO TABS: 25.0000 mg | ORAL_TABLET | Freq: Every evening | ORAL | 0 refills | Status: DC | PRN

## 2020-05-11 MED ORDER — CONTRAVE 8-90 MG PO TB12: 2.0000 | ORAL_TABLET | Freq: Two times a day (BID) | ORAL | 0 refills | Status: DC

## 2020-05-11 MED ORDER — PHENTERMINE HCL 15 MG PO CAPS: 15.0000 mg | ORAL_CAPSULE | ORAL | 0 refills | Status: DC

## 2020-05-11 NOTE — Progress Notes (Signed)
Chief Complaint:   OBESITY Katelyn Walker is here to discuss her progress with her obesity treatment plan along with follow-up of her obesity related diagnoses. Katelyn Walker is on keeping a food journal and adhering to recommended goals of 1200 calories and 80+ grams of protein and states she is following her eating plan approximately 80% of the time. Katelyn Walker states she is swimming for 40 minutes 2 times per week.  Today's visit was #: 38 Starting weight: 242 lbs Starting date: 05/14/2018 Today's weight: 227 lbs Today's date: 05/11/2020 Total lbs lost to date: 15 lbs Total lbs lost since last in-office visit: 3 lbs  Interim History: Katelyn Walker says she is tolerating phentermine with no side effects.  It is helping with portion control.  She reports that she is still not meeting her protein goals.  She has increased her exercise by swimming 3 days per week and increasing the number of laps she does.    Subjective:   1. Prediabetes Katelyn Walker has a diagnosis of prediabetes based on her elevated HgA1c and was informed this puts her at greater risk of developing diabetes. She continues to work on diet and exercise to decrease her risk of diabetes. She denies nausea or hypoglycemia.  Lab Results  Component Value Date   HGBA1C 5.6 01/04/2020   Lab Results  Component Value Date   INSULIN 21.6 01/04/2020   INSULIN 14.9 09/14/2019   INSULIN 17.7 03/19/2019   INSULIN 17.4 10/13/2018   INSULIN 18.3 05/14/2018   2. Other insomnia Katelyn Walker takes trazodone for insomnia.  3. Other depression, with emotional eating  Katelyn Walker is struggling with emotional eating and using food for comfort to the extent that it is negatively impacting her health. She has been working on behavior modification techniques to help reduce her emotional eating and has been successful. She shows no sign of suicidal or homicidal ideations.  Katelyn Walker is taking Contrave and phentermine.  4. At risk for deficient intake of food The patient is  at a higher than average risk of deficient intake of food.  Assessment/Plan:   1. Prediabetes Katelyn Walker will continue to work on weight loss, exercise, and decreasing simple carbohydrates to help decrease the risk of diabetes.   Orders - metFORMIN (GLUCOPHAGE) 500 MG tablet; Take 1 tablet (500 mg total) by mouth daily with breakfast.  Dispense: 30 tablet; Refill: 0  2. Other insomnia The problem of recurrent insomnia was discussed. Orders and follow up as documented in patient record. Counseling: Intensive lifestyle modifications are the first line treatment for this issue. We discussed several lifestyle modifications today and she will continue to work on diet, exercise and weight loss efforts.   Counseling  Limit or avoid alcohol, caffeinated beverages, and cigarettes, especially close to bedtime.   Do not eat a large meal or eat spicy foods right before bedtime. This can lead to digestive discomfort that can make it hard for you to sleep.  Keep a sleep diary to help you and your health care provider figure out what could be causing your insomnia.  . Make your bedroom a dark, comfortable place where it is easy to fall asleep. ? Put up shades or blackout curtains to block light from outside. ? Use a white noise machine to block noise. ? Keep the temperature cool. . Limit screen use before bedtime. This includes: ? Watching TV. ? Using your smartphone, tablet, or computer. . Stick to a routine that includes going to bed and waking up at the same times every day and  night. This can help you fall asleep faster. Consider making a quiet activity, such as reading, part of your nighttime routine. . Try to avoid taking naps during the day so that you sleep better at night. . Get out of bed if you are still awake after 15 minutes of trying to sleep. Keep the lights down, but try reading or doing a quiet activity. When you feel sleepy, go back to bed.  Orders - traZODone (DESYREL) 50 MG tablet;  Take 0.5-1 tablets (25-50 mg total) by mouth at bedtime as needed for sleep.  Dispense: 30 tablet; Refill: 0  3. Other depression, with emotional eating  Behavior modification techniques were discussed today to help Katelyn Walker deal with her emotional/non-hunger eating behaviors.  Orders and follow up as documented in patient record.   Orders - phentermine 15 MG capsule; Take 1 capsule (15 mg total) by mouth every morning.  Dispense: 30 capsule; Refill: 0  4. At risk for deficient intake of food Katelyn Walker was given approximately 15 minutes of deficit intake of food prevention counseling today. Katelyn Walker is at risk for eating too few calories based on current food recall. She was encouraged to focus on meeting caloric and protein goals according to her recommended meal plan.   5. Class 2 severe obesity with serious comorbidity and body mass index (BMI) of 35.0 to 35.9 in adult, unspecified obesity type (Katelyn Walker)  Orders - Naltrexone-buPROPion HCl ER (CONTRAVE) 8-90 MG TB12; Take 2 tablets by mouth 2 (two) times daily.  Dispense: 120 tablet; Refill: 0  Katelyn Walker is currently in the action stage of change. As such, her goal is to continue with weight loss efforts. She has agreed to keeping a food journal and adhering to recommended goals of 1400-1500 calories and 95+ grams of protein.   Try adding Fairlife milk to meals.  Exercise goals: For substantial health benefits, adults should do at least 150 minutes (2 hours and 30 minutes) a week of moderate-intensity, or 75 minutes (1 hour and 15 minutes) a week of vigorous-intensity aerobic physical activity, or an equivalent combination of moderate- and vigorous-intensity aerobic activity. Aerobic activity should be performed in episodes of at least 10 minutes, and preferably, it should be spread throughout the week.  Behavioral modification strategies: increasing lean protein intake and increasing water intake.  Katelyn Walker has agreed to follow-up with our clinic in  2 weeks. She was informed of the importance of frequent follow-up visits to maximize her success with intensive lifestyle modifications for her multiple health conditions.   Objective:   Blood pressure 128/71, pulse 70, temperature 98.4 F (36.9 C), temperature source Oral, height 5\' 7"  (1.702 m), weight 227 lb (103 kg), SpO2 99 %. Body mass index is 35.55 kg/m.  General: Cooperative, alert, well developed, in no acute distress. HEENT: Conjunctivae and lids unremarkable. Cardiovascular: Regular rhythm.  Lungs: Normal work of breathing. Neurologic: No focal deficits.   Lab Results  Component Value Date   CREATININE 0.79 01/04/2020   BUN 9 01/04/2020   NA 139 01/04/2020   K 4.2 01/04/2020   CL 101 01/04/2020   CO2 23 01/04/2020   Lab Results  Component Value Date   ALT 6 01/04/2020   AST 10 01/04/2020   ALKPHOS 86 01/04/2020   BILITOT 0.2 01/04/2020   Lab Results  Component Value Date   HGBA1C 5.6 01/04/2020   HGBA1C 5.6 09/14/2019   HGBA1C 5.8 (H) 03/19/2019   HGBA1C 5.8 (H) 10/13/2018   HGBA1C 5.8 (H) 05/14/2018  Lab Results  Component Value Date   INSULIN 21.6 01/04/2020   INSULIN 14.9 09/14/2019   INSULIN 17.7 03/19/2019   INSULIN 17.4 10/13/2018   INSULIN 18.3 05/14/2018   Lab Results  Component Value Date   TSH 4.080 01/04/2020   Lab Results  Component Value Date   CHOL 168 01/04/2020   HDL 49 01/04/2020   LDLCALC 93 01/04/2020   TRIG 152 (H) 01/04/2020   Lab Results  Component Value Date   WBC 9.3 01/04/2020   HGB 10.9 (L) 01/04/2020   HCT 34.3 01/04/2020   MCV 74 (L) 01/04/2020   PLT 341 01/04/2020   Attestation Statements:   Reviewed by clinician on day of visit: allergies, medications, problem list, medical history, surgical history, family history, social history, and previous encounter notes.  I, Insurance claims handler, CMA, am acting as Energy manager for [employer], DO.  I have reviewed the above documentation for accuracy and  completeness, and I agree with the above. Helane Rima, DO

## 2020-05-31 ENCOUNTER — Encounter (INDEPENDENT_AMBULATORY_CARE_PROVIDER_SITE_OTHER): Payer: Self-pay

## 2020-05-31 DIAGNOSIS — F3289 Other specified depressive episodes: Secondary | ICD-10-CM

## 2020-06-06 ENCOUNTER — Other Ambulatory Visit: Payer: Self-pay

## 2020-06-06 ENCOUNTER — Encounter (INDEPENDENT_AMBULATORY_CARE_PROVIDER_SITE_OTHER): Payer: Self-pay | Admitting: Family Medicine

## 2020-06-06 ENCOUNTER — Ambulatory Visit (INDEPENDENT_AMBULATORY_CARE_PROVIDER_SITE_OTHER): Payer: BC Managed Care – PPO | Admitting: Family Medicine

## 2020-06-06 VITALS — BP 131/77 | HR 66 | Temp 98.0°F | Ht 67.0 in | Wt 230.0 lb

## 2020-06-06 DIAGNOSIS — Z6836 Body mass index (BMI) 36.0-36.9, adult: Secondary | ICD-10-CM

## 2020-06-06 DIAGNOSIS — F3289 Other specified depressive episodes: Secondary | ICD-10-CM | POA: Diagnosis not present

## 2020-06-06 DIAGNOSIS — E559 Vitamin D deficiency, unspecified: Secondary | ICD-10-CM

## 2020-06-06 DIAGNOSIS — Z9189 Other specified personal risk factors, not elsewhere classified: Secondary | ICD-10-CM

## 2020-06-06 MED ORDER — VITAMIN D (ERGOCALCIFEROL) 1.25 MG (50000 UNIT) PO CAPS: 50000.0000 [IU] | ORAL_CAPSULE | ORAL | 0 refills | Status: DC

## 2020-06-06 NOTE — Progress Notes (Signed)
Chief Complaint:   OBESITY Katelyn Walker is here to discuss her progress with her obesity treatment plan along with follow-up of her obesity related diagnoses. Katelyn Walker is on keeping a food journal and adhering to recommended goals of 1400-1500 calories and 95+ grams of protein and states she is following her eating plan approximately 80% of the time. Katelyn Walker states she is swimming and doing cardio for 40 minutes 2-3 times per week.  Today's visit was #: 73 Starting weight: 242 lbs Starting date: 05/14/2018 Today's weight: 230 lb Today's date: 06/06/2020 Total lbs lost to date: 12 lbs Total lbs lost since last in-office visit: 0  Interim History: Katelyn Walker says that she is premenstrual and bloated.  She has had increased sodium recently as well.  Swimming leads to improved sleep, per patient.  She is up 4 pounds of water today.  She received her first COVID vaccine!  Subjective:   1. Vitamin D deficiency Katelyn Walker's Vitamin D level was 44.5 on 01/04/2020. She is currently taking prescription vitamin D 50,000 IU each week. She denies nausea, vomiting or muscle weakness.  2. Other depression, with emotional eating  Katelyn Walker is doing well.  3. At risk for heart disease Katelyn Walker is at a higher than average risk for cardiovascular disease due to obesity.   Assessment/Plan:   1. Vitamin D deficiency Low Vitamin D level contributes to fatigue and are associated with obesity, breast, and colon cancer. She agrees to continue to take prescription Vitamin D @50 ,000 IU every week and will follow-up for routine testing of Vitamin D, at least 2-3 times per year to avoid over-replacement.  Orders - Vitamin D, Ergocalciferol, (DRISDOL) 1.25 MG (50000 UNIT) CAPS capsule; Take 1 capsule (50,000 Units total) by mouth every 7 (seven) days.  Dispense: 4 capsule; Refill: 0  2. Other depression, with emotional eating  Current treatment plan is effective, no change in therapy.  3. At risk for heart  disease Katelyn Walker was given approximately 15 minutes of coronary artery disease prevention counseling today. She is 49 y.o. female and has risk factors for heart disease including obesity. We discussed intensive lifestyle modifications today with an emphasis on specific weight loss instructions and strategies.   Repetitive spaced learning was employed today to elicit superior memory formation and behavioral change.  4. Class 2 severe obesity with serious comorbidity and body mass index (BMI) of 36.0 to 36.9 in adult, unspecified obesity type (HCC) Katelyn Walker is currently in the action stage of change. As such, her goal is to continue with weight loss efforts. She has agreed to keeping a food journal and adhering to recommended goals of 1400-1500 calories and 95+ grams of protein.   Exercise goals: For substantial health benefits, adults should do at least 150 minutes (2 hours and 30 minutes) a week of moderate-intensity, or 75 minutes (1 hour and 15 minutes) a week of vigorous-intensity aerobic physical activity, or an equivalent combination of moderate- and vigorous-intensity aerobic activity. Aerobic activity should be performed in episodes of at least 10 minutes, and preferably, it should be spread throughout the week.  Behavioral modification strategies: increasing lean protein intake, decreasing simple carbohydrates, increasing water intake and decreasing sodium intake.  Katelyn Walker has agreed to follow-up with our clinic in 3 weeks. She was informed of the importance of frequent follow-up visits to maximize her success with intensive lifestyle modifications for her multiple health conditions.   Objective:   Blood pressure 131/77, pulse 66, temperature 98 F (36.7 C), temperature source Oral, height  5\' 7"  (1.702 m), weight 230 lb (104.3 kg), SpO2 98 %. Body mass index is 36.02 kg/m.  General: Cooperative, alert, well developed, in no acute distress. HEENT: Conjunctivae and lids  unremarkable. Cardiovascular: Regular rhythm.  Lungs: Normal work of breathing. Neurologic: No focal deficits.   Lab Results  Component Value Date   CREATININE 0.79 01/04/2020   BUN 9 01/04/2020   NA 139 01/04/2020   K 4.2 01/04/2020   CL 101 01/04/2020   CO2 23 01/04/2020   Lab Results  Component Value Date   ALT 6 01/04/2020   AST 10 01/04/2020   ALKPHOS 86 01/04/2020   BILITOT 0.2 01/04/2020   Lab Results  Component Value Date   HGBA1C 5.6 01/04/2020   HGBA1C 5.6 09/14/2019   HGBA1C 5.8 (H) 03/19/2019   HGBA1C 5.8 (H) 10/13/2018   HGBA1C 5.8 (H) 05/14/2018   Lab Results  Component Value Date   INSULIN 21.6 01/04/2020   INSULIN 14.9 09/14/2019   INSULIN 17.7 03/19/2019   INSULIN 17.4 10/13/2018   INSULIN 18.3 05/14/2018   Lab Results  Component Value Date   TSH 4.080 01/04/2020   Lab Results  Component Value Date   CHOL 168 01/04/2020   HDL 49 01/04/2020   LDLCALC 93 01/04/2020   TRIG 152 (H) 01/04/2020   Lab Results  Component Value Date   WBC 9.3 01/04/2020   HGB 10.9 (L) 01/04/2020   HCT 34.3 01/04/2020   MCV 74 (L) 01/04/2020   PLT 341 01/04/2020   Attestation Statements:   Reviewed by clinician on day of visit: allergies, medications, problem list, medical history, surgical history, family history, social history, and previous encounter notes.  I, 01/06/2020, CMA, am acting as transcriptionist for Insurance claims handler, DO  I have reviewed the above documentation for accuracy and completeness, and I agree with the above. Helane Rima, DO

## 2020-06-07 NOTE — Telephone Encounter (Signed)
Please send RX

## 2020-06-09 MED ORDER — PHENTERMINE HCL 15 MG PO CAPS: 15.0000 mg | ORAL_CAPSULE | ORAL | 0 refills | Status: DC

## 2020-06-29 ENCOUNTER — Ambulatory Visit (INDEPENDENT_AMBULATORY_CARE_PROVIDER_SITE_OTHER): Payer: BC Managed Care – PPO | Admitting: Family Medicine

## 2020-06-29 ENCOUNTER — Other Ambulatory Visit: Payer: Self-pay

## 2020-06-29 ENCOUNTER — Encounter (INDEPENDENT_AMBULATORY_CARE_PROVIDER_SITE_OTHER): Payer: Self-pay | Admitting: Family Medicine

## 2020-06-29 VITALS — BP 118/72 | HR 78 | Temp 98.2°F | Ht 67.0 in | Wt 231.0 lb

## 2020-06-29 DIAGNOSIS — G4709 Other insomnia: Secondary | ICD-10-CM | POA: Diagnosis not present

## 2020-06-29 DIAGNOSIS — E559 Vitamin D deficiency, unspecified: Secondary | ICD-10-CM

## 2020-06-29 DIAGNOSIS — R7303 Prediabetes: Secondary | ICD-10-CM

## 2020-06-29 DIAGNOSIS — Z6836 Body mass index (BMI) 36.0-36.9, adult: Secondary | ICD-10-CM

## 2020-06-29 DIAGNOSIS — E66812 Obesity, class 2: Secondary | ICD-10-CM

## 2020-06-29 DIAGNOSIS — Z9189 Other specified personal risk factors, not elsewhere classified: Secondary | ICD-10-CM

## 2020-06-29 MED ORDER — PHENTERMINE HCL 15 MG PO CAPS: 15.0000 mg | ORAL_CAPSULE | ORAL | 0 refills | Status: DC

## 2020-06-29 MED ORDER — TRAZODONE HCL 50 MG PO TABS: 25.0000 mg | ORAL_TABLET | Freq: Every evening | ORAL | 0 refills | Status: DC | PRN

## 2020-06-29 MED ORDER — VITAMIN D (ERGOCALCIFEROL) 1.25 MG (50000 UNIT) PO CAPS: 50000.0000 [IU] | ORAL_CAPSULE | ORAL | 0 refills | Status: DC

## 2020-06-29 NOTE — Progress Notes (Signed)
Chief Complaint:   OBESITY Katelyn Walker is here to discuss her progress with her obesity treatment plan along with follow-up of her obesity related diagnoses. Katelyn Walker is on keeping a food journal and adhering to recommended goals of 1400-1500 calories and 95+ grams of protein and states she is following her eating plan approximately 75% of the time. Katelyn Walker states she is swimming for 40 minutes 4 times per week.  Today's visit was #: 40 Starting weight: 242 lbs Starting date: 05/14/2018 Today's weight: 231 lbs Today's date: 06/29/2020 Total lbs lost to date: 11 lbs Total lbs lost since last in-office visit: 0  Interim History: Katelyn Walker recently had a birthday and did a lot of celebration eating.  She increased her swimming to 4 times per week.  She says she feels stronger and her clothes are fitting well.  She will be traveling with her daughter next week.  Subjective:   1. Prediabetes with polyphagia Katelyn Walker has a diagnosis of prediabetes based on her elevated HgA1c and was informed this puts her at greater risk of developing diabetes. She continues to work on diet and exercise to decrease her risk of diabetes. She denies nausea or hypoglycemia.  She endorses polyphagia.  Lab Results  Component Value Date   HGBA1C 5.6 01/04/2020   Lab Results  Component Value Date   INSULIN 21.6 01/04/2020   INSULIN 14.9 09/14/2019   INSULIN 17.7 03/19/2019   INSULIN 17.4 10/13/2018   INSULIN 18.3 05/14/2018   2. Vitamin D deficiency Katelyn Walker's Vitamin D level was 44.5 on 01/04/2020. She is currently taking prescription vitamin D 50,000 IU each week. She denies nausea, vomiting or muscle weakness.  3. Other insomnia Katelyn Walker has difficulty sleeping and takes trazodone as needed for insomnia.  Assessment/Plan:   1. Prediabetes with polyphagia Katelyn Walker will continue to work on weight loss, exercise, and decreasing simple carbohydrates to help decrease the risk of diabetes.   Orders -  phentermine 15 MG capsule; Take 1 capsule (15 mg total) by mouth every morning.  Dispense: 30 capsule; Refill: 0  2. Vitamin D deficiency Low Vitamin D level contributes to fatigue and are associated with obesity, breast, and colon cancer. She agrees to continue to take prescription Vitamin D @50 ,000 IU every week and will follow-up for routine testing of Vitamin D, at least 2-3 times per year to avoid over-replacement.  Orders - Vitamin D, Ergocalciferol, (DRISDOL) 1.25 MG (50000 UNIT) CAPS capsule; Take 1 capsule (50,000 Units total) by mouth every 7 (seven) days.  Dispense: 4 capsule; Refill: 0  3. Other insomnia The problem of recurrent insomnia was discussed. Orders and follow up as documented in patient record. Counseling: Intensive lifestyle modifications are the first line treatment for this issue. We discussed several lifestyle modifications today and she will continue to work on diet, exercise and weight loss efforts.   Counseling  Limit or avoid alcohol, caffeinated beverages, and cigarettes, especially close to bedtime.   Do not eat a large meal or eat spicy foods right before bedtime. This can lead to digestive discomfort that can make it hard for you to sleep.  Keep a sleep diary to help you and your health care provider figure out what could be causing your insomnia.  . Make your bedroom a dark, comfortable place where it is easy to fall asleep. ? Put up shades or blackout curtains to block light from outside. ? Use a white noise machine to block noise. ? Keep the temperature cool. . Limit screen  use before bedtime. This includes: ? Watching TV. ? Using your smartphone, tablet, or computer. . Stick to a routine that includes going to bed and waking up at the same times every day and night. This can help you fall asleep faster. Consider making a quiet activity, such as reading, part of your nighttime routine. . Try to avoid taking naps during the day so that you sleep better  at night. . Get out of bed if you are still awake after 15 minutes of trying to sleep. Keep the lights down, but try reading or doing a quiet activity. When you feel sleepy, go back to bed.  Orders - traZODone (DESYREL) 50 MG tablet; Take 0.5-1 tablets (25-50 mg total) by mouth at bedtime as needed for sleep.  Dispense: 30 tablet; Refill: 0  4. At risk for heart disease Katelyn Walker was given approximately 15 minutes of coronary artery disease prevention counseling today. She is 49 y.o. female and has risk factors for heart disease including obesity. We discussed intensive lifestyle modifications today with an emphasis on specific weight loss instructions and strategies.   Repetitive spaced learning was employed today to elicit superior memory formation and behavioral change.  5. Class 2 severe obesity with serious comorbidity and body mass index (BMI) of 36.0 to 36.9 in adult, unspecified obesity type (HCC) Katelyn Walker is currently in the action stage of change. As such, her goal is to continue with weight loss efforts. She has agreed to keeping a food journal and adhering to recommended goals of 1400-1500 calories and 95 grams of protein.   Exercise goals: For substantial health benefits, adults should do at least 150 minutes (2 hours and 30 minutes) a week of moderate-intensity, or 75 minutes (1 hour and 15 minutes) a week of vigorous-intensity aerobic physical activity, or an equivalent combination of moderate- and vigorous-intensity aerobic activity. Aerobic activity should be performed in episodes of at least 10 minutes, and preferably, it should be spread throughout the week.  Behavioral modification strategies: increasing lean protein intake, decreasing simple carbohydrates and increasing water intake.  Katelyn Walker has agreed to follow-up with our clinic in 3 weeks. She was informed of the importance of frequent follow-up visits to maximize her success with intensive lifestyle modifications for her  multiple health conditions.   Katelyn Walker was informed we would discuss her lab results at her next visit unless there is a critical issue that needs to be addressed sooner. Katelyn Walker agreed to keep her next visit at the agreed upon time to discuss these results.  Objective:   Blood pressure 118/72, pulse 78, temperature 98.2 F (36.8 C), temperature source Oral, height 5\' 7"  (1.702 m), weight 231 lb (104.8 kg), SpO2 99 %. Body mass index is 36.18 kg/m.  General: Cooperative, alert, well developed, in no acute distress. HEENT: Conjunctivae and lids unremarkable. Cardiovascular: Regular rhythm.  Lungs: Normal work of breathing. Neurologic: No focal deficits.   Lab Results  Component Value Date   CREATININE 0.79 01/04/2020   BUN 9 01/04/2020   NA 139 01/04/2020   K 4.2 01/04/2020   CL 101 01/04/2020   CO2 23 01/04/2020   Lab Results  Component Value Date   ALT 6 01/04/2020   AST 10 01/04/2020   ALKPHOS 86 01/04/2020   BILITOT 0.2 01/04/2020   Lab Results  Component Value Date   HGBA1C 5.6 01/04/2020   HGBA1C 5.6 09/14/2019   HGBA1C 5.8 (H) 03/19/2019   HGBA1C 5.8 (H) 10/13/2018   HGBA1C 5.8 (H) 05/14/2018  Lab Results  Component Value Date   INSULIN 21.6 01/04/2020   INSULIN 14.9 09/14/2019   INSULIN 17.7 03/19/2019   INSULIN 17.4 10/13/2018   INSULIN 18.3 05/14/2018   Lab Results  Component Value Date   TSH 4.080 01/04/2020   Lab Results  Component Value Date   CHOL 168 01/04/2020   HDL 49 01/04/2020   LDLCALC 93 01/04/2020   TRIG 152 (H) 01/04/2020   Lab Results  Component Value Date   WBC 9.3 01/04/2020   HGB 10.9 (L) 01/04/2020   HCT 34.3 01/04/2020   MCV 74 (L) 01/04/2020   PLT 341 01/04/2020   Attestation Statements:   Reviewed by clinician on day of visit: allergies, medications, problem list, medical history, surgical history, family history, social history, and previous encounter notes.  I, Insurance claims handler, CMA, am acting as transcriptionist for  Helane Rima, DO  I have reviewed the above documentation for accuracy and completeness, and I agree with the above. Helane Rima, DO

## 2020-06-30 LAB — COMPREHENSIVE METABOLIC PANEL
ALT: 6 IU/L (ref 0–32)
AST: 8 IU/L (ref 0–40)
Albumin/Globulin Ratio: 1.5 (ref 1.2–2.2)
Albumin: 3.7 g/dL — ABNORMAL LOW (ref 3.8–4.8)
Alkaline Phosphatase: 87 IU/L (ref 48–121)
BUN/Creatinine Ratio: 13 (ref 9–23)
BUN: 10 mg/dL (ref 6–24)
Bilirubin Total: <0.2 mg/dL (ref 0.0–1.2)
CO2: 23 mmol/L (ref 20–29)
Calcium: 8.5 mg/dL — ABNORMAL LOW (ref 8.7–10.2)
Chloride: 102 mmol/L (ref 96–106)
Creatinine, Ser: 0.8 mg/dL (ref 0.57–1.00)
GFR calc Af Amer: 99 mL/min/{1.73_m2} (ref >59)
GFR calc non Af Amer: 86 mL/min/{1.73_m2} (ref >59)
Globulin, Total: 2.5 g/dL (ref 1.5–4.5)
Glucose: 89 mg/dL (ref 65–99)
Potassium: 4.3 mmol/L (ref 3.5–5.2)
Sodium: 137 mmol/L (ref 134–144)
Total Protein: 6.2 g/dL (ref 6.0–8.5)

## 2020-06-30 LAB — CBC WITH DIFFERENTIAL/PLATELET
Basophils Absolute: 0 10*3/uL (ref 0.0–0.2)
Basos: 0 %
EOS (ABSOLUTE): 0.2 10*3/uL (ref 0.0–0.4)
Eos: 2 %
Hemoglobin: 11.3 g/dL (ref 11.1–15.9)
Immature Grans (Abs): 0 10*3/uL (ref 0.0–0.1)
Immature Granulocytes: 0 %
Lymphocytes Absolute: 1.9 10*3/uL (ref 0.7–3.1)
Lymphs: 20 %
MCH: 24.5 pg — ABNORMAL LOW (ref 26.6–33.0)
MCHC: 31.7 g/dL (ref 31.5–35.7)
MCV: 77 fL — ABNORMAL LOW (ref 79–97)
Monocytes Absolute: 0.5 10*3/uL (ref 0.1–0.9)
Monocytes: 6 %
Neutrophils Absolute: 6.8 10*3/uL (ref 1.4–7.0)
Neutrophils: 72 %
Platelets: 334 10*3/uL (ref 150–450)
RBC: 4.62 x10E6/uL (ref 3.77–5.28)
RDW: 15 % (ref 11.7–15.4)
WBC: 9.4 10*3/uL (ref 3.4–10.8)

## 2020-06-30 LAB — LIPID PANEL
Chol/HDL Ratio: 4.6 ratio — ABNORMAL HIGH (ref 0.0–4.4)
Cholesterol, Total: 180 mg/dL (ref 100–199)
HDL: 39 mg/dL — ABNORMAL LOW (ref >39)
LDL Chol Calc (NIH): 102 mg/dL — ABNORMAL HIGH (ref 0–99)
Triglycerides: 228 mg/dL — ABNORMAL HIGH (ref 0–149)
VLDL Cholesterol Cal: 39 mg/dL (ref 5–40)

## 2020-06-30 LAB — ANEMIA PANEL
Ferritin: 9 ng/mL — ABNORMAL LOW (ref 15–150)
Folate, Hemolysate: 343 ng/mL
Folate, RBC: 963 ng/mL (ref >498)
Hematocrit: 35.6 % (ref 34.0–46.6)
Iron Saturation: 15 % (ref 15–55)
Iron: 52 ug/dL (ref 27–159)
Retic Ct Pct: 1.6 % (ref 0.6–2.6)
Total Iron Binding Capacity: 350 ug/dL (ref 250–450)
UIBC: 298 ug/dL (ref 131–425)
Vitamin B-12: 138 pg/mL — ABNORMAL LOW (ref 232–1245)

## 2020-06-30 LAB — VITAMIN D 25 HYDROXY (VIT D DEFICIENCY, FRACTURES): Vit D, 25-Hydroxy: 45.5 ng/mL (ref 30.0–100.0)

## 2020-06-30 LAB — HEMOGLOBIN A1C
Est. average glucose Bld gHb Est-mCnc: 117 mg/dL
Hgb A1c MFr Bld: 5.7 % — ABNORMAL HIGH (ref 4.8–5.6)

## 2020-07-20 ENCOUNTER — Ambulatory Visit (INDEPENDENT_AMBULATORY_CARE_PROVIDER_SITE_OTHER): Payer: BC Managed Care – PPO | Admitting: Family Medicine

## 2020-07-20 ENCOUNTER — Other Ambulatory Visit: Payer: Self-pay

## 2020-07-20 ENCOUNTER — Encounter (INDEPENDENT_AMBULATORY_CARE_PROVIDER_SITE_OTHER): Payer: Self-pay | Admitting: Family Medicine

## 2020-07-20 VITALS — BP 127/64 | HR 68 | Temp 97.9°F | Ht 67.0 in | Wt 228.0 lb

## 2020-07-20 DIAGNOSIS — E538 Deficiency of other specified B group vitamins: Secondary | ICD-10-CM | POA: Diagnosis not present

## 2020-07-20 DIAGNOSIS — E559 Vitamin D deficiency, unspecified: Secondary | ICD-10-CM

## 2020-07-20 DIAGNOSIS — G4709 Other insomnia: Secondary | ICD-10-CM

## 2020-07-20 DIAGNOSIS — Z9189 Other specified personal risk factors, not elsewhere classified: Secondary | ICD-10-CM | POA: Diagnosis not present

## 2020-07-20 DIAGNOSIS — R790 Abnormal level of blood mineral: Secondary | ICD-10-CM

## 2020-07-20 DIAGNOSIS — Z6835 Body mass index (BMI) 35.0-35.9, adult: Secondary | ICD-10-CM

## 2020-07-21 MED ORDER — TRAZODONE HCL 50 MG PO TABS: 25.0000 mg | ORAL_TABLET | Freq: Every evening | ORAL | 0 refills | Status: DC | PRN

## 2020-07-21 MED ORDER — VITAMIN D (ERGOCALCIFEROL) 1.25 MG (50000 UNIT) PO CAPS: 50000.0000 [IU] | ORAL_CAPSULE | ORAL | 0 refills | Status: DC

## 2020-07-21 MED ORDER — PHENTERMINE HCL 15 MG PO CAPS: 15.0000 mg | ORAL_CAPSULE | Freq: Two times a day (BID) | ORAL | 0 refills | Status: DC

## 2020-07-21 NOTE — Progress Notes (Signed)
Chief Complaint:   OBESITY Katelyn Walker is here to discuss her progress with her obesity treatment plan along with follow-up of her obesity related diagnoses. Katelyn Walker is on keeping a food journal and adhering to recommended goals of 1400-1500 calories and 95 grams of protein and states she is following her eating plan approximately 50% of the time. Katelyn Walker states she is swimming for 45 minutes 1-2 times per week.  Today's visit was #: 41 Starting weight: 242 lbs Starting date: 05/14/2018 Today's weight: 228 lbs Today's date: 07/20/2020 Total lbs lost to date: 14 lbs Total lbs lost since last in-office visit: 3 lbs Total weight loss percentage to date: -5.79%  Interim History: Katelyn Walker is orthostatic (intermittent) when she wakes in the morning.  Subjective:   1. B12 deficiency  Lab Results  Component Value Date   VITAMINB12 138 (L) 06/29/2020   2. Decreased ferritin  CBC Latest Ref Rng & Units 06/29/2020 01/04/2020 05/14/2018  WBC 3.4 - 10.8 x10E3/uL 9.4 9.3 8.7  Hemoglobin 11.1 - 15.9 g/dL 01.7 10.9(L) 12.0  Hematocrit 34.0 - 46.6 % 35.6 34.3 38.8  Platelets 150 - 450 x10E3/uL 334 341 -   Lab Results  Component Value Date   IRON 52 06/29/2020   TIBC 350 06/29/2020   FERRITIN 9 (L) 06/29/2020   3. Vitamin D deficiency Katelyn Walker's Vitamin D level was 45.5 on 06/29/2020. She is currently taking prescription vitamin D 50,000 IU every 2 weeks.   4. Other insomnia Katelyn Walker has insomnia and takes trazodone 25-50 mg as needed at bedtime.  Assessment/Plan:   1. B12 deficiency The diagnosis was reviewed with the patient. Counseling provided today, see below. We will continue to monitor. Orders and follow up as documented in patient record.  Take vitamin B12 1000 mcg daily.  2. Decreased ferritin Orders and follow up as documented in patient record.  3. Vitamin D deficiency Not optimized. Goal is > 50. Low Vitamin D level contributes to fatigue and are associated with obesity,  breast, and colon cancer. She agrees to continue to take prescription Vitamin D @50 ,000 IU every 14 days and will follow-up for routine testing of Vitamin D, at least 2-3 times per year to avoid over-replacement.  Orders - Vitamin D, Ergocalciferol, (DRISDOL) 1.25 MG (50000 UNIT) CAPS capsule; Take 1 capsule (50,000 Units total) by mouth every 7 (seven) days.  Dispense: 4 capsule; Refill: 0  4. Other insomnia Well controlled.  No signs of complications, medication side effects, or red flags.  Continue current regimen.    - traZODone (DESYREL) 50 MG tablet; Take 0.5-1 tablets (25-50 mg total) by mouth at bedtime as needed for sleep.  Dispense: 30 tablet; Refill: 0  5. At risk for heart disease Katelyn Walker was given approximately 15 minutes of coronary artery disease prevention counseling today. She is 49 y.o. female and has risk factors for heart disease including obesity and metabolic syndrome. We discussed intensive lifestyle modifications today with an emphasis on specific weight loss instructions and strategies.   Repetitive spaced learning was employed today to elicit superior memory formation and behavioral change.  6. Class 2 severe obesity with serious comorbidity and body mass index (BMI) of 35.0 to 35.9 in adult, unspecified obesity type (HCC)  Orders - phentermine 15 MG capsule; Take 1 capsule (15 mg total) by mouth in the morning and at bedtime.  Dispense: 60 capsule; Refill: 0  I have consulted the  Controlled Substances Registry for this patient, and feel the risk/benefit ratio today is  favorable for proceeding with this prescription for a controlled substance. The patient understands monitoring parameters and red flags.   Katelyn Walker is currently in the action stage of change. As such, her goal is to continue with weight loss efforts. She has agreed to keeping a food journal and adhering to recommended goals of 1400-1500 calories and 95 grams of protein.   Exercise goals: For  substantial health benefits, adults should do at least 150 minutes (2 hours and 30 minutes) a week of moderate-intensity, or 75 minutes (1 hour and 15 minutes) a week of vigorous-intensity aerobic physical activity, or an equivalent combination of moderate- and vigorous-intensity aerobic activity. Aerobic activity should be performed in episodes of at least 10 minutes, and preferably, it should be spread throughout the week.  Behavioral modification strategies: increasing lean protein intake.  Katelyn Walker has agreed to follow-up with our clinic in 3 weeks. She was informed of the importance of frequent follow-up visits to maximize her success with intensive lifestyle modifications for her multiple health conditions.   Objective:   Blood pressure 127/64, pulse 68, temperature 97.9 F (36.6 C), temperature source Oral, height 5\' 7"  (1.702 m), weight 228 lb (103.4 kg), SpO2 99 %. Body mass index is 35.71 kg/m.  General: Cooperative, alert, well developed, in no acute distress. HEENT: Conjunctivae and lids unremarkable. Cardiovascular: Regular rhythm.  Lungs: Normal work of breathing. Neurologic: No focal deficits.   Lab Results  Component Value Date   CREATININE 0.80 06/29/2020   BUN 10 06/29/2020   NA 137 06/29/2020   K 4.3 06/29/2020   CL 102 06/29/2020   CO2 23 06/29/2020   Lab Results  Component Value Date   ALT 6 06/29/2020   AST 8 06/29/2020   ALKPHOS 87 06/29/2020   BILITOT <0.2 06/29/2020   Lab Results  Component Value Date   HGBA1C 5.7 (H) 06/29/2020   HGBA1C 5.6 01/04/2020   HGBA1C 5.6 09/14/2019   HGBA1C 5.8 (H) 03/19/2019   HGBA1C 5.8 (H) 10/13/2018   Lab Results  Component Value Date   INSULIN 21.6 01/04/2020   INSULIN 14.9 09/14/2019   INSULIN 17.7 03/19/2019   INSULIN 17.4 10/13/2018   INSULIN 18.3 05/14/2018   Lab Results  Component Value Date   TSH 4.080 01/04/2020   Lab Results  Component Value Date   CHOL 180 06/29/2020   HDL 39 (L) 06/29/2020    LDLCALC 102 (H) 06/29/2020   TRIG 228 (H) 06/29/2020   CHOLHDL 4.6 (H) 06/29/2020   Lab Results  Component Value Date   WBC 9.4 06/29/2020   HGB 11.3 06/29/2020   HCT 35.6 06/29/2020   MCV 77 (L) 06/29/2020   PLT 334 06/29/2020   Lab Results  Component Value Date   IRON 52 06/29/2020   TIBC 350 06/29/2020   FERRITIN 9 (L) 06/29/2020   Attestation Statements:   Reviewed by clinician on day of visit: allergies, medications, problem list, medical history, surgical history, family history, social history, and previous encounter notes.  I, 07/01/2020, CMA, am acting as transcriptionist for Insurance claims handler, DO  I have reviewed the above documentation for accuracy and completeness, and I agree with the above. Helane Rima, DO

## 2020-08-17 ENCOUNTER — Ambulatory Visit (INDEPENDENT_AMBULATORY_CARE_PROVIDER_SITE_OTHER): Payer: BC Managed Care – PPO | Admitting: Family Medicine

## 2020-08-27 ENCOUNTER — Other Ambulatory Visit (INDEPENDENT_AMBULATORY_CARE_PROVIDER_SITE_OTHER): Payer: Self-pay | Admitting: Family Medicine

## 2020-08-27 DIAGNOSIS — G4709 Other insomnia: Secondary | ICD-10-CM

## 2020-08-29 ENCOUNTER — Encounter (INDEPENDENT_AMBULATORY_CARE_PROVIDER_SITE_OTHER): Payer: Self-pay

## 2020-08-29 NOTE — Telephone Encounter (Signed)
My chart message sent to pt.

## 2020-08-31 ENCOUNTER — Ambulatory Visit (INDEPENDENT_AMBULATORY_CARE_PROVIDER_SITE_OTHER): Payer: BC Managed Care – PPO | Admitting: Family Medicine

## 2020-08-31 ENCOUNTER — Encounter (INDEPENDENT_AMBULATORY_CARE_PROVIDER_SITE_OTHER): Payer: Self-pay | Admitting: Family Medicine

## 2020-08-31 ENCOUNTER — Other Ambulatory Visit: Payer: Self-pay

## 2020-08-31 VITALS — BP 137/83 | HR 81 | Temp 98.3°F | Ht 67.0 in | Wt 230.0 lb

## 2020-08-31 DIAGNOSIS — R790 Abnormal level of blood mineral: Secondary | ICD-10-CM | POA: Diagnosis not present

## 2020-08-31 DIAGNOSIS — R5383 Other fatigue: Secondary | ICD-10-CM

## 2020-08-31 DIAGNOSIS — E782 Mixed hyperlipidemia: Secondary | ICD-10-CM | POA: Diagnosis not present

## 2020-08-31 DIAGNOSIS — E559 Vitamin D deficiency, unspecified: Secondary | ICD-10-CM | POA: Diagnosis not present

## 2020-08-31 DIAGNOSIS — R7303 Prediabetes: Secondary | ICD-10-CM | POA: Diagnosis not present

## 2020-08-31 DIAGNOSIS — E538 Deficiency of other specified B group vitamins: Secondary | ICD-10-CM | POA: Diagnosis not present

## 2020-08-31 DIAGNOSIS — G4709 Other insomnia: Secondary | ICD-10-CM

## 2020-08-31 DIAGNOSIS — Z6836 Body mass index (BMI) 36.0-36.9, adult: Secondary | ICD-10-CM

## 2020-09-01 LAB — CBC WITH DIFFERENTIAL/PLATELET
Basophils Absolute: 0 10*3/uL (ref 0.0–0.2)
Basos: 0 %
EOS (ABSOLUTE): 0.2 10*3/uL (ref 0.0–0.4)
Eos: 2 %
Hemoglobin: 12 g/dL (ref 11.1–15.9)
Immature Grans (Abs): 0 10*3/uL (ref 0.0–0.1)
Immature Granulocytes: 0 %
Lymphocytes Absolute: 2.4 10*3/uL (ref 0.7–3.1)
Lymphs: 21 %
MCH: 24.7 pg — ABNORMAL LOW (ref 26.6–33.0)
MCHC: 32 g/dL (ref 31.5–35.7)
MCV: 77 fL — ABNORMAL LOW (ref 79–97)
Monocytes Absolute: 0.6 10*3/uL (ref 0.1–0.9)
Monocytes: 5 %
Neutrophils Absolute: 8.2 10*3/uL — ABNORMAL HIGH (ref 1.4–7.0)
Neutrophils: 72 %
Platelets: 385 10*3/uL (ref 150–450)
RBC: 4.85 x10E6/uL (ref 3.77–5.28)
RDW: 14.9 % (ref 11.7–15.4)
WBC: 11.5 10*3/uL — ABNORMAL HIGH (ref 3.4–10.8)

## 2020-09-01 LAB — COMPREHENSIVE METABOLIC PANEL
ALT: 6 IU/L (ref 0–32)
AST: 12 IU/L (ref 0–40)
Albumin/Globulin Ratio: 1.3 (ref 1.2–2.2)
Albumin: 4 g/dL (ref 3.8–4.8)
Alkaline Phosphatase: 97 IU/L (ref 44–121)
BUN/Creatinine Ratio: 10 (ref 9–23)
BUN: 10 mg/dL (ref 6–24)
Bilirubin Total: <0.2 mg/dL (ref 0.0–1.2)
CO2: 24 mmol/L (ref 20–29)
Calcium: 9 mg/dL (ref 8.7–10.2)
Chloride: 101 mmol/L (ref 96–106)
Creatinine, Ser: 0.98 mg/dL (ref 0.57–1.00)
GFR calc Af Amer: 78 mL/min/{1.73_m2} (ref >59)
GFR calc non Af Amer: 67 mL/min/{1.73_m2} (ref >59)
Globulin, Total: 3.1 g/dL (ref 1.5–4.5)
Glucose: 109 mg/dL — ABNORMAL HIGH (ref 65–99)
Potassium: 4 mmol/L (ref 3.5–5.2)
Sodium: 141 mmol/L (ref 134–144)
Total Protein: 7.1 g/dL (ref 6.0–8.5)

## 2020-09-01 LAB — ANEMIA PANEL
Ferritin: 15 ng/mL (ref 15–150)
Folate, Hemolysate: 353 ng/mL
Folate, RBC: 941 ng/mL (ref >498)
Hematocrit: 37.5 % (ref 34.0–46.6)
Iron Saturation: 6 % — CL (ref 15–55)
Iron: 21 ug/dL — ABNORMAL LOW (ref 27–159)
Retic Ct Pct: 1.6 % (ref 0.6–2.6)
Total Iron Binding Capacity: 369 ug/dL (ref 250–450)
UIBC: 348 ug/dL (ref 131–425)
Vitamin B-12: 503 pg/mL (ref 232–1245)

## 2020-09-01 MED ORDER — VITAMIN D (ERGOCALCIFEROL) 1.25 MG (50000 UNIT) PO CAPS: 50000.0000 [IU] | ORAL_CAPSULE | ORAL | 0 refills | Status: DC

## 2020-09-01 MED ORDER — CONTRAVE 8-90 MG PO TB12: 2.0000 | ORAL_TABLET | Freq: Two times a day (BID) | ORAL | 0 refills | Status: DC

## 2020-09-01 MED ORDER — TRAZODONE HCL 50 MG PO TABS: 25.0000 mg | ORAL_TABLET | Freq: Every evening | ORAL | 0 refills | Status: DC | PRN

## 2020-09-05 NOTE — Progress Notes (Signed)
Chief Complaint:   OBESITY Katelyn Walker is here to discuss her progress with her obesity treatment plan along with follow-up of her obesity related diagnoses. Katelyn Walker is on keeping a food journal and adhering to recommended goals of 1200 calories and 90 grams of protein and states she is following her eating plan approximately 85% of the time. Katelyn Walker states she is swimming for 30 minutes 3 times per week.  Today's visit was #: 42 Starting weight: 242 lbs Starting date: 05/14/2018 Today's weight: 230 lbs Today's date: 08/31/2020 Total lbs lost to date: 12 lbs Total lbs lost since last in-office visit: 0 Total weight loss percentage to date: -4.96%  Interim History: Katelyn Walker says she helped her mom (had lumbar surgery) and went to the beach.  She is still having fatigue.  She is taking vitamin B12 and an iron supplement.  She says she tried drinking soda to see if her blood sugar was low, but it was not helpful.  She has not checked her blood pressure yet.  Assessment/Plan:   1. Other fatigue Katelyn Walker has been having more fatigue than usual.  Will check labs today.  She will decrease Contrave by 1/2 if her labs are normal.  - Comprehensive metabolic panel - CBC with Differential/Platelet - Anemia panel  2. Vitamin D deficiency Current vitamin D is 45.5, tested on 06/29/2020. Not at goal. Optimal goal > 50 ng/dL. There is also evidence to support a goal of >70 ng/dL in patients with cancer and heart disease. Plan: Continue Vitamin D @50 ,000 IU every week with follow-up for routine testing of Vitamin D at least 2-3 times per year to avoid over-replacement.  - Refill Vitamin D, Ergocalciferol, (DRISDOL) 1.25 MG (50000 UNIT) CAPS capsule; Take 1 capsule (50,000 Units total) by mouth every 7 (seven) days.  Dispense: 4 capsule; Refill: 0  3. Other insomnia The problem of recurrent insomnia was discussed. We discussed several lifestyle modifications today and she will continue to work on diet,  exercise and weight loss efforts.   Counseling  Limit or avoid alcohol, caffeinated beverages, and cigarettes, especially close to bedtime.   Do not eat a large meal or eat spicy foods right before bedtime. This can lead to digestive discomfort that can make it hard for you to sleep.  Keep a sleep diary to help you and your health care provider figure out what could be causing your insomnia.  . Make your bedroom a dark, comfortable place where it is easy to fall asleep. ? Put up shades or blackout curtains to block light from outside. ? Use a white noise machine to block noise. ? Keep the temperature cool. . Limit screen use before bedtime. This includes: ? Watching TV. ? Using your smartphone, tablet, or computer. . Stick to a routine that includes going to bed and waking up at the same times every day and night. This can help you fall asleep faster. Consider making a quiet activity, such as reading, part of your nighttime routine. . Try to avoid taking naps during the day so that you sleep better at night. . Get out of bed if you are still awake after 15 minutes of trying to sleep. Keep the lights down, but try reading or doing a quiet activity. When you feel sleepy, go back to bed.  - Refill traZODone (DESYREL) 50 MG tablet; Take 0.5-1 tablets (25-50 mg total) by mouth at bedtime as needed for sleep.  Dispense: 30 tablet; Refill: 0  4. Class 2  severe obesity with serious comorbidity and body mass index (BMI) of 36.0 to 36.9 in adult, unspecified obesity type (HCC)  - Refill Naltrexone-buPROPion HCl ER (CONTRAVE) 8-90 MG TB12; Take 2 tablets by mouth 2 (two) times daily.  Dispense: 120 tablet; Refill: 0  Tinamarie is currently in the action stage of change. As such, her goal is to continue with weight loss efforts. She has agreed to keeping a food journal and adhering to recommended goals of 1200 calories and 90 grams of protein.   Exercise goals: For substantial health benefits, adults  should do at least 150 minutes (2 hours and 30 minutes) a week of moderate-intensity, or 75 minutes (1 hour and 15 minutes) a week of vigorous-intensity aerobic physical activity, or an equivalent combination of moderate- and vigorous-intensity aerobic activity. Aerobic activity should be performed in episodes of at least 10 minutes, and preferably, it should be spread throughout the week.  Behavioral modification strategies: increasing lean protein intake and increasing water intake.  Arlen has agreed to follow-up with our clinic in 2-3 weeks. She was informed of the importance of frequent follow-up visits to maximize her success with intensive lifestyle modifications for her multiple health conditions.   Katelyn Walker was informed we would discuss her lab results at her next visit unless there is a critical issue that needs to be addressed sooner. Katelyn Walker agreed to keep her next visit at the agreed upon time to discuss these results.  Objective:   Blood pressure 137/83, pulse 81, temperature 98.3 F (36.8 C), temperature source Oral, height 5\' 7"  (1.702 m), weight 230 lb (104.3 kg), SpO2 97 %. Body mass index is 36.02 kg/m.  General: Cooperative, alert, well developed, in no acute distress. HEENT: Conjunctivae and lids unremarkable. Cardiovascular: Regular rhythm.  Lungs: Normal work of breathing. Neurologic: No focal deficits.   Lab Results  Component Value Date   CREATININE 0.98 08/31/2020   BUN 10 08/31/2020   NA 141 08/31/2020   K 4.0 08/31/2020   CL 101 08/31/2020   CO2 24 08/31/2020   Lab Results  Component Value Date   ALT 6 08/31/2020   AST 12 08/31/2020   ALKPHOS 97 08/31/2020   BILITOT <0.2 08/31/2020   Lab Results  Component Value Date   HGBA1C 5.7 (H) 06/29/2020   HGBA1C 5.6 01/04/2020   HGBA1C 5.6 09/14/2019   HGBA1C 5.8 (H) 03/19/2019   HGBA1C 5.8 (H) 10/13/2018   Lab Results  Component Value Date   INSULIN 21.6 01/04/2020   INSULIN 14.9 09/14/2019    INSULIN 17.7 03/19/2019   INSULIN 17.4 10/13/2018   INSULIN 18.3 05/14/2018   Lab Results  Component Value Date   TSH 4.080 01/04/2020   Lab Results  Component Value Date   CHOL 180 06/29/2020   HDL 39 (L) 06/29/2020   LDLCALC 102 (H) 06/29/2020   TRIG 228 (H) 06/29/2020   CHOLHDL 4.6 (H) 06/29/2020   Lab Results  Component Value Date   WBC 11.5 (H) 08/31/2020   HGB 12.0 08/31/2020   HCT 37.5 08/31/2020   MCV 77 (L) 08/31/2020   PLT 385 08/31/2020   Lab Results  Component Value Date   IRON 21 (L) 08/31/2020   TIBC 369 08/31/2020   FERRITIN 15 08/31/2020   Attestation Statements:   Reviewed by clinician on day of visit: allergies, medications, problem list, medical history, surgical history, family history, social history, and previous encounter notes.  I, 09/02/2020, CMA, am acting as Insurance claims handler for Energy manager, DO  I have reviewed the above documentation for accuracy and completeness, and I agree with the above. Briscoe Deutscher, DO

## 2020-09-19 ENCOUNTER — Encounter (INDEPENDENT_AMBULATORY_CARE_PROVIDER_SITE_OTHER): Payer: Self-pay

## 2020-09-22 ENCOUNTER — Ambulatory Visit (INDEPENDENT_AMBULATORY_CARE_PROVIDER_SITE_OTHER): Payer: BC Managed Care – PPO | Admitting: Family Medicine

## 2020-09-22 ENCOUNTER — Other Ambulatory Visit: Payer: Self-pay

## 2020-09-22 ENCOUNTER — Encounter (INDEPENDENT_AMBULATORY_CARE_PROVIDER_SITE_OTHER): Payer: Self-pay | Admitting: Family Medicine

## 2020-09-22 VITALS — BP 128/81 | HR 79 | Temp 98.2°F | Ht 67.0 in | Wt 234.0 lb

## 2020-09-22 DIAGNOSIS — E559 Vitamin D deficiency, unspecified: Secondary | ICD-10-CM

## 2020-09-22 DIAGNOSIS — R632 Polyphagia: Secondary | ICD-10-CM | POA: Diagnosis not present

## 2020-09-22 DIAGNOSIS — Z9189 Other specified personal risk factors, not elsewhere classified: Secondary | ICD-10-CM

## 2020-09-22 DIAGNOSIS — Z6836 Body mass index (BMI) 36.0-36.9, adult: Secondary | ICD-10-CM

## 2020-09-22 DIAGNOSIS — R609 Edema, unspecified: Secondary | ICD-10-CM | POA: Diagnosis not present

## 2020-09-22 MED ORDER — HYDROCHLOROTHIAZIDE 12.5 MG PO CAPS: 12.5000 mg | ORAL_CAPSULE | Freq: Every day | ORAL | 0 refills | Status: DC

## 2020-09-22 MED ORDER — PHENTERMINE HCL 15 MG PO CAPS: 15.0000 mg | ORAL_CAPSULE | Freq: Two times a day (BID) | ORAL | 0 refills | Status: DC

## 2020-09-22 MED ORDER — VITAMIN D (ERGOCALCIFEROL) 1.25 MG (50000 UNIT) PO CAPS: 50000.0000 [IU] | ORAL_CAPSULE | ORAL | 0 refills | Status: DC

## 2020-09-26 NOTE — Progress Notes (Signed)
Chief Complaint:   OBESITY Katelyn Walker is here to discuss her progress with her obesity treatment plan along with follow-up of her obesity related diagnoses. Katelyn Walker is on keeping a food journal and adhering to recommended goals of 1200 calories and 80 grams of protein and states she is following her eating plan approximately 85% of the time. [place] states she is swimming for 60 minutes 4 times per week.  Today's visit was #: 43 Starting weight: 242 lbs Starting date: 05/14/2018 Today's weight: 234 lbs Today's date: 09/22/2020 Total lbs lost to date: 8 lbs Total lbs lost since last in-office visit: 0  Interim History: Katelyn Walker has gone down to 1 Contrave.  She has increased her swimming to 1 mile in 1 hour 4 times per week.  Premenstrual symptoms started this week for her.  She is taking iron every other day.  She says she will start taking MiraLAX daily.  Assessment/Plan:   1. Vitamin D deficiency Current vitamin D is 45.5, tested on 06/29/2020. Not at goal. Optimal goal > 50 ng/dL. Plan: Continue Vitamin D @50 ,000 IU every week with follow-up for routine testing of Vitamin D at least 2-3 times per year to avoid over-replacement.  - Refill Vitamin D, Ergocalciferol, (DRISDOL) 1.25 MG (50000 UNIT) CAPS capsule; Take 1 capsule (50,000 Units total) by mouth every 7 (seven) days.  Dispense: 4 capsule; Refill: 0  2. Edema, lower extremity Katelyn Walker has some lower extremity edema.  Will start HCTZ 12.5 mg daily as needed to help with this.    - Start hydrochlorothiazide (MICROZIDE) 12.5 MG capsule; Take 1 capsule (12.5 mg total) by mouth daily.  Dispense: 30 capsule; Refill: 0  3. Polyphagia Controlled on current regimen. She will continue to focus on protein-rich, low simple carbohydrate foods. We reviewed the importance of hydration, regular exercise for stress reduction, and restorative sleep.   - Refill phentermine 15 MG capsule; Take 1 capsule (15 mg total) by mouth in the morning and at  bedtime.  Dispense: 60 capsule; Refill: 0  Patient understands that long-term use of phentermine is considered off-label use of this medication, however, that the Endocrine Society and recent research supports that long-term use of phentermine does not appear to have detrimental health effects when used in the appropriate patient. In addition, a 2019 study published in Obesity Journal on 13,972 patients concluded that "recommendations to limit phentermine to less than 3 months do not align with current concepts of pharmacologic treatment of obesity", and that "long term phentermine users experience greater weight loss without apparent increases in cardiovascular risk".  The potential risks and benefits of phentermine have been reviewed with the patient, and alternative treatment options were discussed. All questions were answered, and the patient wishes to move forward with this medication.  I have consulted the [place] Controlled Substances Registry for this patient, and feel the risk/benefit ratio today is favorable for proceeding with this prescription for a controlled substance. The patient understands monitoring parameters and red flags.   4. At risk for constipation Katelyn Walker was given approximately 15 minutes of counseling today regarding prevention of constipation. She was encouraged to increase water and fiber intake.   5. Class 2 severe obesity with serious comorbidity and body mass index (BMI) of 36.0 to 36.9 in adult, unspecified obesity type (HCC)  Katelyn Walker is currently in the action stage of change. As such, her goal is to continue with weight loss efforts. She has agreed to keeping a food journal and adhering to recommended  goals of 1200 calories and 80 grams of protein.   Exercise goals: For substantial health benefits, adults should do at least 150 minutes (2 hours and 30 minutes) a week of moderate-intensity, or 75 minutes (1 hour and 15 minutes) a week of vigorous-intensity aerobic physical  activity, or an equivalent combination of moderate- and vigorous-intensity aerobic activity. Aerobic activity should be performed in episodes of at least 10 minutes, and preferably, it should be spread throughout the week.  Behavioral modification strategies: increasing lean protein intake and decreasing simple carbohydrates.  Diego has agreed to follow-up with our clinic in 3 weeks. She was informed of the importance of frequent follow-up visits to maximize her success with intensive lifestyle modifications for her multiple health conditions.   Objective:   Blood pressure 128/81, pulse 79, temperature 98.2 F (36.8 C), temperature source Oral, height 5\' 7"  (1.702 m), weight 234 lb (106.1 kg), SpO2 98 %. Body mass index is 36.65 kg/m.  General: Cooperative, alert, well developed, in no acute distress. HEENT: Conjunctivae and lids unremarkable. Cardiovascular: Regular rhythm.  Lungs: Normal work of breathing. Neurologic: No focal deficits.   Lab Results  Component Value Date   CREATININE 0.98 08/31/2020   BUN 10 08/31/2020   NA 141 08/31/2020   K 4.0 08/31/2020   CL 101 08/31/2020   CO2 24 08/31/2020   Lab Results  Component Value Date   ALT 6 08/31/2020   AST 12 08/31/2020   ALKPHOS 97 08/31/2020   BILITOT <0.2 08/31/2020   Lab Results  Component Value Date   HGBA1C 5.7 (H) 06/29/2020   HGBA1C 5.6 01/04/2020   HGBA1C 5.6 09/14/2019   HGBA1C 5.8 (H) 03/19/2019   HGBA1C 5.8 (H) 10/13/2018   Lab Results  Component Value Date   INSULIN 21.6 01/04/2020   INSULIN 14.9 09/14/2019   INSULIN 17.7 03/19/2019   INSULIN 17.4 10/13/2018   INSULIN 18.3 05/14/2018   Lab Results  Component Value Date   TSH 4.080 01/04/2020   Lab Results  Component Value Date   CHOL 180 06/29/2020   HDL 39 (L) 06/29/2020   LDLCALC 102 (H) 06/29/2020   TRIG 228 (H) 06/29/2020   CHOLHDL 4.6 (H) 06/29/2020   Lab Results  Component Value Date   WBC 11.5 (H) 08/31/2020   HGB 12.0  08/31/2020   HCT 37.5 08/31/2020   MCV 77 (L) 08/31/2020   PLT 385 08/31/2020   Lab Results  Component Value Date   IRON 21 (L) 08/31/2020   TIBC 369 08/31/2020   FERRITIN 15 08/31/2020   Attestation Statements:   Reviewed by clinician on day of visit: allergies, medications, problem list, medical history, surgical history, family history, social history, and previous encounter notes.  I, 09/02/2020, CMA, am acting as transcriptionist for Insurance claims handler, DO  I have reviewed the above documentation for accuracy and completeness, and I agree with the above. Helane Rima, DO

## 2020-09-30 ENCOUNTER — Other Ambulatory Visit (INDEPENDENT_AMBULATORY_CARE_PROVIDER_SITE_OTHER): Payer: Self-pay | Admitting: Family Medicine

## 2020-09-30 DIAGNOSIS — G4709 Other insomnia: Secondary | ICD-10-CM

## 2020-10-03 ENCOUNTER — Encounter (INDEPENDENT_AMBULATORY_CARE_PROVIDER_SITE_OTHER): Payer: Self-pay

## 2020-10-03 NOTE — Telephone Encounter (Signed)
Message sent to pt.

## 2020-10-10 ENCOUNTER — Encounter (INDEPENDENT_AMBULATORY_CARE_PROVIDER_SITE_OTHER): Payer: Self-pay | Admitting: Family Medicine

## 2020-10-13 ENCOUNTER — Ambulatory Visit (INDEPENDENT_AMBULATORY_CARE_PROVIDER_SITE_OTHER): Payer: BC Managed Care – PPO | Admitting: Family Medicine

## 2020-10-13 ENCOUNTER — Other Ambulatory Visit: Payer: Self-pay

## 2020-10-13 ENCOUNTER — Encounter (INDEPENDENT_AMBULATORY_CARE_PROVIDER_SITE_OTHER): Payer: Self-pay | Admitting: Family Medicine

## 2020-10-13 VITALS — BP 137/80 | HR 77 | Temp 98.1°F | Ht 67.0 in | Wt 232.0 lb

## 2020-10-13 DIAGNOSIS — R7303 Prediabetes: Secondary | ICD-10-CM | POA: Diagnosis not present

## 2020-10-13 DIAGNOSIS — R632 Polyphagia: Secondary | ICD-10-CM

## 2020-10-13 DIAGNOSIS — E559 Vitamin D deficiency, unspecified: Secondary | ICD-10-CM

## 2020-10-13 DIAGNOSIS — Z6836 Body mass index (BMI) 36.0-36.9, adult: Secondary | ICD-10-CM

## 2020-10-13 DIAGNOSIS — Z9189 Other specified personal risk factors, not elsewhere classified: Secondary | ICD-10-CM | POA: Diagnosis not present

## 2020-10-13 DIAGNOSIS — G4709 Other insomnia: Secondary | ICD-10-CM | POA: Diagnosis not present

## 2020-10-13 MED ORDER — METFORMIN HCL 500 MG PO TABS: 500.0000 mg | ORAL_TABLET | Freq: Two times a day (BID) | ORAL | 0 refills | Status: DC

## 2020-10-13 MED ORDER — PHENTERMINE HCL 15 MG PO CAPS: 15.0000 mg | ORAL_CAPSULE | Freq: Two times a day (BID) | ORAL | 0 refills | Status: DC

## 2020-10-13 MED ORDER — VITAMIN D (ERGOCALCIFEROL) 1.25 MG (50000 UNIT) PO CAPS: 50000.0000 [IU] | ORAL_CAPSULE | ORAL | 0 refills | Status: DC

## 2020-10-16 NOTE — Progress Notes (Signed)
Chief Complaint:   OBESITY Katelyn Walker is here to discuss her progress with her obesity treatment plan along with follow-up of her obesity related diagnoses.   Body mass index is 36.34 kg/m.  Total weight loss percentage to date: -4.13%  Nutrition Plan: keeping a food journal and adhering to recommended goals of 1200 calories and 95 protein.  Anti-obesity medications: Metformin, Contrave, Phentermine. Reported side effects: None. Hunger is well controlled controlled. Cravings are well controlled controlled.  Activity: Katelyn Walker continues to swim regularly and recently met her goal of 1 mile! Stress: Stable.  Assessment/Plan:   1. Prediabetes Not optimized. Goal is HgbA1c < 5.7.  Medication: Metformin.  She will continue to focus on protein-rich, low simple carbohydrate foods. We reviewed the importance of hydration, regular exercise for stress reduction, and restorative sleep.   Lab Results  Component Value Date   HGBA1C 5.7 (H) 06/29/2020   Lab Results  Component Value Date   INSULIN 21.6 01/04/2020   INSULIN 14.9 09/14/2019   INSULIN 17.7 03/19/2019   INSULIN 17.4 10/13/2018   INSULIN 18.3 05/14/2018   - Increase metFORMIN (GLUCOPHAGE) 500 MG tablet; Take 1 tablet (500 mg total) by mouth 2 (two) times daily with a meal.  Dispense: 60 tablet; Refill: 0  2. Other insomnia This is moderately controlled.   Plan: Recommend sleep hygiene measures including regular sleep schedule, optimal sleep environment, and relaxing presleep rituals. We will continue to monitor symptoms as they relate to her weight loss journey. This issue directly impacts care plan for optimization of BMI and metabolic health as it impacts the patient's ability to make lifestyle changes.  3. Polyphagia Hyperphagia, also called polyphagia, refers to excessive feelings of hunger. This is more likely to be an issues for people that have diabetes, prediabetes, or insulin resistance. She will continue to focus on  protein-rich, low simple carbohydrate foods. We reviewed the importance of hydration, regular exercise for stress reduction, and restorative sleep.  Patient understands that long-term use of phentermine is considered off-label use of this medication, however, that the Endocrine Society and recent research supports that long-term use of phentermine does not appear to have detrimental health effects when used in the appropriate patient. In addition, a 2019 study published in Obesity Journal on 13,972 patients concluded that "recommendations to limit phentermine to less than 3 months do not align with current concepts of pharmacologic treatment of obesity", and that "long term phentermine users experience greater weight loss without apparent increases in cardiovascular risk".  The potential risks and benefits of phentermine have been reviewed with the patient, and alternative treatment options were discussed. All questions were answered, and the patient wishes to move forward with this medication.   I have consulted the [place] Controlled Substances Registry for this patient, and feel the risk/benefit ratio today is favorable for proceeding with this prescription for a controlled substance. The patient understands monitoring parameters and red flags.   - phentermine 15 MG capsule; Take 1 capsule (15 mg total) by mouth in the morning and at bedtime.  Dispense: 60 capsule; Refill: 0  4. Vitamin D deficiency Improving, but not optimized. Optimal goal > 50 ng/dL.   Plan:  '[x]'   Continue Vitamin D '@50' ,000 IU every week. '[]'   Continue home supplement daily. '[x]'   Follow-up for routine testing of Vitamin D at least 2-3 times per year to avoid over-replacement. '[]'   Monitored by PCP.  - Vitamin D, Ergocalciferol, (DRISDOL) 1.25 MG (50000 UNIT) CAPS capsule; Take 1 capsule (  50,000 Units total) by mouth every 7 (seven) days.  Dispense: 4 capsule; Refill: 0  5. At risk for diarrhea Katelyn Walker was given approximately 15  minutes of diarrhea prevention counseling today. She is 49 y.o. female and has risk factors for diarrhea including medications and changes in diet. We discussed intensive lifestyle modifications today with an emphasis on specific weight loss instructions including dietary strategies.   6. Class 2 severe obesity with serious comorbidity and body mass index (BMI) of 36.0 to 36.9 in adult, unspecified obesity type (HCC)  Katelyn Walker is currently in the action stage of change. As such, her goal is to continue with weight loss efforts.   Nutrition goals: She has agreed to keeping a food journal and adhering to recommended goals of 1200 calories and 95 protein.   Exercise goals: For substantial health benefits, adults should do at least 150 minutes (2 hours and 30 minutes) a week of moderate-intensity, or 75 minutes (1 hour and 15 minutes) a week of vigorous-intensity aerobic physical activity, or an equivalent combination of moderate- and vigorous-intensity aerobic activity. Aerobic activity should be performed in episodes of at least 10 minutes, and preferably, it should be spread throughout the week. Adults should also include muscle-strengthening activities that involve all major muscle groups on 2 or more days a week.  Behavioral modification strategies: increasing lean protein intake, decreasing simple carbohydrates, increasing vegetables and increasing water intake.  Katelyn Walker has agreed to follow-up with our clinic in 3 weeks. She was informed of the importance of frequent follow-up visits to maximize her success with intensive lifestyle modifications for her multiple health conditions.   Objective:   Blood pressure 137/80, pulse 77, temperature 98.1 F (36.7 C), temperature source Oral, height '5\' 7"'  (1.702 m), weight 232 lb (105.2 kg), SpO2 96 %. Body mass index is 36.34 kg/m.  General: Cooperative, alert, well developed, in no acute distress. HEENT: Conjunctivae and lids  unremarkable. Cardiovascular: Regular rhythm.  Lungs: Normal work of breathing. Neurologic: No focal deficits.   Lab Results  Component Value Date   CREATININE 0.98 08/31/2020   BUN 10 08/31/2020   NA 141 08/31/2020   K 4.0 08/31/2020   CL 101 08/31/2020   CO2 24 08/31/2020   Lab Results  Component Value Date   ALT 6 08/31/2020   AST 12 08/31/2020   ALKPHOS 97 08/31/2020   BILITOT <0.2 08/31/2020   Lab Results  Component Value Date   HGBA1C 5.7 (H) 06/29/2020   HGBA1C 5.6 01/04/2020   HGBA1C 5.6 09/14/2019   HGBA1C 5.8 (H) 03/19/2019   HGBA1C 5.8 (H) 10/13/2018   Lab Results  Component Value Date   INSULIN 21.6 01/04/2020   INSULIN 14.9 09/14/2019   INSULIN 17.7 03/19/2019   INSULIN 17.4 10/13/2018   INSULIN 18.3 05/14/2018   Lab Results  Component Value Date   TSH 4.080 01/04/2020   Lab Results  Component Value Date   CHOL 180 06/29/2020   HDL 39 (L) 06/29/2020   LDLCALC 102 (H) 06/29/2020   TRIG 228 (H) 06/29/2020   CHOLHDL 4.6 (H) 06/29/2020   Lab Results  Component Value Date   WBC 11.5 (H) 08/31/2020   HGB 12.0 08/31/2020   HCT 37.5 08/31/2020   MCV 77 (L) 08/31/2020   PLT 385 08/31/2020   Lab Results  Component Value Date   IRON 21 (L) 08/31/2020   TIBC 369 08/31/2020   FERRITIN 15 08/31/2020   Attestation Statements:   Reviewed by clinician on day of visit:  allergies, medications, problem list, medical history, surgical history, family history, social history, and previous encounter notes.

## 2020-11-07 ENCOUNTER — Other Ambulatory Visit: Payer: Self-pay

## 2020-11-07 ENCOUNTER — Ambulatory Visit (INDEPENDENT_AMBULATORY_CARE_PROVIDER_SITE_OTHER): Payer: BC Managed Care – PPO | Admitting: Family Medicine

## 2020-11-07 ENCOUNTER — Encounter (INDEPENDENT_AMBULATORY_CARE_PROVIDER_SITE_OTHER): Payer: Self-pay | Admitting: Family Medicine

## 2020-11-07 VITALS — BP 136/84 | HR 75 | Temp 98.0°F | Ht 67.0 in | Wt 232.0 lb

## 2020-11-07 DIAGNOSIS — Z9189 Other specified personal risk factors, not elsewhere classified: Secondary | ICD-10-CM

## 2020-11-07 DIAGNOSIS — Z6836 Body mass index (BMI) 36.0-36.9, adult: Secondary | ICD-10-CM

## 2020-11-07 DIAGNOSIS — R632 Polyphagia: Secondary | ICD-10-CM

## 2020-11-07 DIAGNOSIS — E559 Vitamin D deficiency, unspecified: Secondary | ICD-10-CM | POA: Diagnosis not present

## 2020-11-07 DIAGNOSIS — G4709 Other insomnia: Secondary | ICD-10-CM

## 2020-11-07 MED ORDER — VITAMIN D (ERGOCALCIFEROL) 1.25 MG (50000 UNIT) PO CAPS: 50000.0000 [IU] | ORAL_CAPSULE | ORAL | 0 refills | Status: DC

## 2020-11-07 MED ORDER — TRAZODONE HCL 50 MG PO TABS: 25.0000 mg | ORAL_TABLET | Freq: Every evening | ORAL | 0 refills | Status: DC | PRN

## 2020-11-07 MED ORDER — CONTRAVE 8-90 MG PO TB12: 2.0000 | ORAL_TABLET | Freq: Two times a day (BID) | ORAL | 0 refills | Status: DC

## 2020-11-07 MED ORDER — PHENTERMINE HCL 15 MG PO CAPS: 15.0000 mg | ORAL_CAPSULE | Freq: Two times a day (BID) | ORAL | 0 refills | Status: DC

## 2020-11-09 NOTE — Progress Notes (Signed)
Chief Complaint:   OBESITY Katelyn Walker is here to discuss her progress with her obesity treatment plan along with follow-up of her obesity related diagnoses.   Today's visit was #: 44 Starting weight: 242 lbs Starting date: 05/14/2018 Today's weight: 232 lbs Today's date: 11/07/2020 Total lbs lost to date: 10 lbs Body mass index is 36.34 kg/m.  Total weight loss percentage to date: -4.13%  Interim History: [name] says that her cardiovascular fitness and recovery are improving.  She reports being "bad about water".  She will be seeing [name] in January for her annual GYN visit.  Nutrition Plan: keeping a food journal and adhering to recommended goals of 1200 calories and 80 grams of protein.  Anti-obesity medications: metformin, Contrave, and phentermine.  Activity: Swimming for 60 minutes 3-4 times per week.  Assessment/Plan:   1. Vitamin D deficiency At goal. Current vitamin D is 45.5, tested on 06/29/2020. Optimal goal > 50 ng/dL.   Plan:  [x]   Continue Vitamin D @50 ,000 IU every week. []   Continue home supplement daily. [x]   Follow-up for routine testing of Vitamin D at least 2-3 times per year to avoid over-replacement.  - Refill Vitamin D, Ergocalciferol, (DRISDOL) 1.25 MG (50000 UNIT) CAPS capsule; Take 1 capsule (50,000 Units total) by mouth every 7 (seven) days.  Dispense: 4 capsule; Refill: 0  2. Other insomnia Recommend sleep hygiene measures including regular sleep schedule, optimal sleep environment, and relaxing presleep rituals. Medication: trazodone 25-50 mg as needed at bedtime.  - Refill traZODone (DESYREL) 50 MG tablet; Take 0.5-1 tablets (25-50 mg total) by mouth at bedtime as needed for sleep.  Dispense: 90 tablet; Refill: 0  3. Polyphagia Controlled. She will continue to focus on protein-rich, low simple carbohydrate foods. We reviewed the importance of hydration, regular exercise for stress reduction, and restorative sleep.  - Refill phentermine 15  MG capsule; Take 1 capsule (15 mg total) by mouth in the morning and at bedtime.  Dispense: 60 capsule; Refill: 0 - Refill Naltrexone-buPROPion HCl ER (CONTRAVE) 8-90 MG TB12; Take 2 tablets by mouth 2 (two) times daily.  Dispense: 360 tablet; Refill: 0  This patient 1) has no evidence of serious cardiovascular disease; 2) does not have serious psychiatric disease or a history of substance abuse; 3) has been informed about weight loss medications that are FDA-approved for long term use and told that these have been documented to be safe and effective; 4) does not demonstrate a clinically significant increase in pulse or BP when taking phentermine; and 5) demonstrates significant weight loss while using the medication. Patient understands that all anti-obesity medications are contraindicated in pregnancy. Pt denies a history of glaucoma.   Patient understands that long-term use of phentermine is considered off-label use of this medication, however, that the Endocrine Society and recent research supports that long-term use of phentermine does not appear to have detrimental health effects when used in the appropriate patient. In addition, a 2019 study published in Obesity Journal on 13,972 patients concluded that "recommendations to limit phentermine to less than 3 months do not align with current concepts of pharmacologic treatment of obesity", and that "long term phentermine users experience greater weight loss without apparent increases in cardiovascular risk".  The potential risks and benefits of phentermine have been reviewed with the patient, and alternative treatment options were discussed. All questions were answered, and the patient wishes to move forward with this medication.  I have consulted the [place] Controlled Substances Registry for this patient,  and feel the risk/benefit ratio today is favorable for proceeding with this prescription for a controlled substance. The patient understands monitoring  parameters and red flags.   4. At risk for dehydration Angelina was given approximately 15 minutes dehydration prevention counseling today. Addalee is at risk for dehydration due to weight loss and current medication(s). She was encouraged to hydrate and monitor fluid status to avoid dehydration as well as weight loss plateaus.   5. Class 2 severe obesity with serious comorbidity and body mass index (BMI) of 36.0 to 36.9 in adult, unspecified obesity type (HCC)  Course: [name] is currently in the action stage of change. As such, her goal is to continue with weight loss efforts.   Nutrition goals: She has agreed to keeping a food journal and adhering to recommended goals of 1200 calories and 80 grams of protein.   Exercise goals: As is.  Behavioral modification strategies: increasing lean protein intake, decreasing simple carbohydrates, increasing vegetables, increasing water intake and decreasing liquid calories.  [name] has agreed to follow-up with our clinic in 3-4 weeks. She was informed of the importance of frequent follow-up visits to maximize her success with intensive lifestyle modifications for her multiple health conditions.   Objective:   Blood pressure 136/84, pulse 75, temperature 98 F (36.7 C), temperature source Oral, height 5\' 7"  (1.702 m), weight 232 lb (105.2 kg), SpO2 98 %. Body mass index is 36.34 kg/m.  General: Cooperative, alert, well developed, in no acute distress. HEENT: Conjunctivae and lids unremarkable. Cardiovascular: Regular rhythm.  Lungs: Normal work of breathing. Neurologic: No focal deficits.   Lab Results  Component Value Date   CREATININE 0.98 08/31/2020   BUN 10 08/31/2020   NA 141 08/31/2020   K 4.0 08/31/2020   CL 101 08/31/2020   CO2 24 08/31/2020   Lab Results  Component Value Date   ALT 6 08/31/2020   AST 12 08/31/2020   ALKPHOS 97 08/31/2020   BILITOT <0.2 08/31/2020   Lab Results  Component Value Date   HGBA1C 5.7 (H)  06/29/2020   HGBA1C 5.6 01/04/2020   HGBA1C 5.6 09/14/2019   HGBA1C 5.8 (H) 03/19/2019   HGBA1C 5.8 (H) 10/13/2018   Lab Results  Component Value Date   INSULIN 21.6 01/04/2020   INSULIN 14.9 09/14/2019   INSULIN 17.7 03/19/2019   INSULIN 17.4 10/13/2018   INSULIN 18.3 05/14/2018   Lab Results  Component Value Date   TSH 4.080 01/04/2020   Lab Results  Component Value Date   CHOL 180 06/29/2020   HDL 39 (L) 06/29/2020   LDLCALC 102 (H) 06/29/2020   TRIG 228 (H) 06/29/2020   CHOLHDL 4.6 (H) 06/29/2020   Lab Results  Component Value Date   WBC 11.5 (H) 08/31/2020   HGB 12.0 08/31/2020   HCT 37.5 08/31/2020   MCV 77 (L) 08/31/2020   PLT 385 08/31/2020   Lab Results  Component Value Date   IRON 21 (L) 08/31/2020   TIBC 369 08/31/2020   FERRITIN 15 08/31/2020   Attestation Statements:   Reviewed by clinician on day of visit: allergies, medications, problem list, medical history, surgical history, family history, social history, and previous encounter notes.  I, 09/02/2020, CMA, am acting as transcriptionist for Insurance claims handler, DO  I have reviewed the above documentation for accuracy and completeness, and I agree with the above. Helane Rima, DO

## 2020-11-15 ENCOUNTER — Encounter (INDEPENDENT_AMBULATORY_CARE_PROVIDER_SITE_OTHER): Payer: Self-pay | Admitting: Family Medicine

## 2020-11-28 ENCOUNTER — Other Ambulatory Visit (INDEPENDENT_AMBULATORY_CARE_PROVIDER_SITE_OTHER): Payer: Self-pay | Admitting: Family Medicine

## 2020-11-28 ENCOUNTER — Ambulatory Visit (INDEPENDENT_AMBULATORY_CARE_PROVIDER_SITE_OTHER): Payer: BC Managed Care – PPO | Admitting: Family Medicine

## 2020-11-28 ENCOUNTER — Other Ambulatory Visit: Payer: Self-pay

## 2020-11-28 ENCOUNTER — Encounter (INDEPENDENT_AMBULATORY_CARE_PROVIDER_SITE_OTHER): Payer: Self-pay | Admitting: Family Medicine

## 2020-11-28 VITALS — BP 124/79 | HR 78 | Temp 98.0°F | Ht 67.0 in | Wt 236.0 lb

## 2020-11-28 DIAGNOSIS — R600 Localized edema: Secondary | ICD-10-CM | POA: Diagnosis not present

## 2020-11-28 DIAGNOSIS — R7303 Prediabetes: Secondary | ICD-10-CM

## 2020-11-28 DIAGNOSIS — E559 Vitamin D deficiency, unspecified: Secondary | ICD-10-CM

## 2020-11-28 DIAGNOSIS — R632 Polyphagia: Secondary | ICD-10-CM

## 2020-11-28 DIAGNOSIS — Z6837 Body mass index (BMI) 37.0-37.9, adult: Secondary | ICD-10-CM

## 2020-11-28 MED ORDER — VITAMIN D (ERGOCALCIFEROL) 1.25 MG (50000 UNIT) PO CAPS: 50000.0000 [IU] | ORAL_CAPSULE | ORAL | 0 refills | Status: DC

## 2020-11-28 MED ORDER — HYDROCHLOROTHIAZIDE 12.5 MG PO CAPS: 12.5000 mg | ORAL_CAPSULE | Freq: Every day | ORAL | 0 refills | Status: DC

## 2020-11-28 MED ORDER — NALTREXONE HCL 50 MG PO TABS: 25.0000 mg | ORAL_TABLET | Freq: Every day | ORAL | 0 refills | Status: DC

## 2020-11-28 MED ORDER — BUPROPION HCL ER (XL) 150 MG PO TB24
150.0000 mg | ORAL_TABLET | Freq: Every day | ORAL | 0 refills | Status: DC
Start: 2020-11-28 — End: 2021-04-11

## 2020-11-28 NOTE — Progress Notes (Signed)
Chief Complaint:   OBESITY Katelyn Walker is here to discuss her progress with her obesity treatment plan along with follow-up of her obesity related diagnoses.   Today's visit was #: 45 Starting weight: 242 lbs Starting date: 05/14/2018 Today's weight: 236 lbs Today's date: 11/28/2020 Total lbs lost to date: 6 lbs Body mass index is 36.96 kg/m.  Total weight loss percentage to date: -2.48%  Interim History: Katelyn Walker says she has increased her swimming to 1700 yards in 1 hour.  Her clothes are fitting better.  She reports getting less sleep (disrupted).  She says she enjoyed Thanksgiving, but felt sick that night. Nutrition Plan: keeping a food journal and adhering to recommended goals of 1200 calories and 90 grams of protein.  Anti-obesity medications: metformin and phentermine.  Hunger is well controlled. Cravings are well controlled.  Activity: Swimming for 60 minutes 4 times per week. Sleep:  Disrupted lately.  Assessment/Plan:   1. Bilateral lower extremity edema Braeleigh is taking HCTZ 12.5 mg daily.  Plan:  Refill HCTZ today.  - Refill hydrochlorothiazide (MICROZIDE) 12.5 MG capsule; Take 1 capsule (12.5 mg total) by mouth daily.  Dispense: 30 capsule; Refill: 0  2. Vitamin D deficiency At goal. Current vitamin D is 45.5, tested on 06/29/2020. Optimal goal > 50 ng/dL.   Plan:  [x]   Continue Vitamin D @50 ,000 IU every week. []   Continue home supplement daily. [x]   Follow-up for routine testing of Vitamin D at least 2-3 times per year to avoid over-replacement.  - Refill Vitamin D, Ergocalciferol, (DRISDOL) 1.25 MG (50000 UNIT) CAPS capsule; Take 1 capsule (50,000 Units total) by mouth every 7 (seven) days.  Dispense: 4 capsule; Refill: 0  3. Polyphagia Contrave working well, but is > $100/month. Will separate into the two components. She will continue to focus on protein-rich, low simple carbohydrate foods. We reviewed the importance of hydration, regular exercise for  stress reduction, and restorative sleep.  - Refill buPROPion (WELLBUTRIN XL) 150 MG 24 hr tablet; Take 1 tablet (150 mg total) by mouth daily.  Dispense: 30 tablet; Refill: 0 - Refill naltrexone (DEPADE) 50 MG tablet; Take 0.5 tablets (25 mg total) by mouth daily.  Dispense: 30 tablet; Refill: 0  4. Class 2 severe obesity with serious comorbidity and body mass index (BMI) of 37.0 to 37.9 in adult, unspecified obesity type (HCC)  Course: Katelyn Walker is currently in the action stage of change. As such, her goal is to continue with weight loss efforts.   Nutrition goals: She has agreed to keeping a food journal and adhering to recommended goals of 1200 calories and 90 grams of protein.   Exercise goals: For substantial health benefits, adults should do at least 150 minutes (2 hours and 30 minutes) a week of moderate-intensity, or 75 minutes (1 hour and 15 minutes) a week of vigorous-intensity aerobic physical activity, or an equivalent combination of moderate- and vigorous-intensity aerobic activity. Aerobic activity should be performed in episodes of at least 10 minutes, and preferably, it should be spread throughout the week.  Behavioral modification strategies: increasing lean protein intake, decreasing simple carbohydrates, increasing vegetables and increasing water intake.  [name] has agreed to follow-up with our clinic in 4 weeks. She was informed of the importance of frequent follow-up visits to maximize her success with intensive lifestyle modifications for her multiple health conditions.   Objective:   Blood pressure 124/79, pulse 78, temperature 98 F (36.7 C), temperature source Oral, height 5\' 7"  (1.702 m), weight 236 lb (  107 kg), SpO2 97 %. Body mass index is 36.96 kg/m.  General: Cooperative, alert, well developed, in no acute distress. HEENT: Conjunctivae and lids unremarkable. Cardiovascular: Regular rhythm.  Lungs: Normal work of breathing. Neurologic: No focal deficits.    Lab Results  Component Value Date   CREATININE 0.98 08/31/2020   BUN 10 08/31/2020   NA 141 08/31/2020   K 4.0 08/31/2020   CL 101 08/31/2020   CO2 24 08/31/2020   Lab Results  Component Value Date   ALT 6 08/31/2020   AST 12 08/31/2020   ALKPHOS 97 08/31/2020   BILITOT <0.2 08/31/2020   Lab Results  Component Value Date   HGBA1C 5.7 (H) 06/29/2020   HGBA1C 5.6 01/04/2020   HGBA1C 5.6 09/14/2019   HGBA1C 5.8 (H) 03/19/2019   HGBA1C 5.8 (H) 10/13/2018   Lab Results  Component Value Date   INSULIN 21.6 01/04/2020   INSULIN 14.9 09/14/2019   INSULIN 17.7 03/19/2019   INSULIN 17.4 10/13/2018   INSULIN 18.3 05/14/2018   Lab Results  Component Value Date   TSH 4.080 01/04/2020   Lab Results  Component Value Date   CHOL 180 06/29/2020   HDL 39 (L) 06/29/2020   LDLCALC 102 (H) 06/29/2020   TRIG 228 (H) 06/29/2020   CHOLHDL 4.6 (H) 06/29/2020   Lab Results  Component Value Date   WBC 11.5 (H) 08/31/2020   HGB 12.0 08/31/2020   HCT 37.5 08/31/2020   MCV 77 (L) 08/31/2020   PLT 385 08/31/2020   Lab Results  Component Value Date   IRON 21 (L) 08/31/2020   TIBC 369 08/31/2020   FERRITIN 15 08/31/2020   Attestation Statements:   Reviewed by clinician on day of visit: allergies, medications, problem list, medical history, surgical history, family history, social history, and previous encounter notes.  I, Insurance claims handler, CMA, am acting as transcriptionist for Helane Rima, DO  I have reviewed the above documentation for accuracy and completeness, and I agree with the above. Helane Rima, DO

## 2020-11-28 NOTE — Telephone Encounter (Signed)
Dr.Wallace °

## 2020-12-31 ENCOUNTER — Encounter (INDEPENDENT_AMBULATORY_CARE_PROVIDER_SITE_OTHER): Payer: Self-pay

## 2021-01-02 ENCOUNTER — Encounter (INDEPENDENT_AMBULATORY_CARE_PROVIDER_SITE_OTHER): Payer: Self-pay

## 2021-01-02 DIAGNOSIS — E538 Deficiency of other specified B group vitamins: Secondary | ICD-10-CM | POA: Insufficient documentation

## 2021-01-02 DIAGNOSIS — E782 Mixed hyperlipidemia: Secondary | ICD-10-CM | POA: Insufficient documentation

## 2021-01-02 DIAGNOSIS — E611 Iron deficiency: Secondary | ICD-10-CM | POA: Insufficient documentation

## 2021-01-02 DIAGNOSIS — R632 Polyphagia: Secondary | ICD-10-CM | POA: Insufficient documentation

## 2021-01-02 NOTE — Progress Notes (Signed)
TeleHealth Visit:  Due to the COVID-19 pandemic, this visit was completed with telemedicine (audio/video) technology to reduce patient and provider exposure as well as to preserve personal protective equipment.   Saidi has verbally consented to this TeleHealth visit. The patient is located at home, the provider is located at the [facility] office. The participants in this visit include the listed provider and patient and. The visit was conducted today via MyChart.  OBESITY Katelyn Walker is here to discuss her progress with her obesity treatment plan along with follow-up of her obesity related diagnoses.   Today's visit was #: 46 Starting weight: 242 lbs Starting date: 05/14/2018 Today's date: 01/02/2021  Interim History: Katelyn Walker has felt that she is doing very well over the last couple of weeks.  She is excited to say that she can get the last button on her coat.  Today she weighs 232 pounds on her scale.  She has been diligent with logging her food.  She reports that she has increased her consumption of beans, nuts, cheese.  She reports insomnia that is chronic but has worsened recently.  She is able to fall asleep but routinely wakes between 12 AM and 2 AM.  She feels that her sleep is restless.  On days that she swims, she feels better.  She uses a ring to monitor.  Chief Complaint  Patient presents with  . Follow-up    Meal plan: 1200/90, 90%; exercise: swimming 60 mins 4 x week; feels like shes lost weight since clothes fit better    Assessment/Plan:   1. Vitamin D deficiency At goal. Current vitamin D is 45.5, tested on 06/29/2020. Optimal goal > 50 ng/dL.   Plan:  [x]   Continue Vitamin D @50 ,000 IU every week. []   Continue home supplement daily. [x]   Follow-up for routine testing of Vitamin D at least 2-3 times per year to avoid over-replacement.  - Refill Vitamin D, Ergocalciferol, (DRISDOL) 1.25 MG (50000 UNIT) CAPS capsule; Take 1 capsule (50,000 Units total)  by mouth every 7 (seven) days.  Dispense: 4 capsule; Refill: 0  2. Polyphagia Hyperphagia, also called polyphagia, refers to excessive feelings of hunger. This is more likely to be an issues for people that have diabetes, prediabetes, or insulin resistance. She will continue to focus on protein-rich, low simple carbohydrate foods. We reviewed the importance of hydration, regular exercise for stress reduction, and restorative sleep.  - Refill phentermine 15 MG capsule; 1 in am and 1 at noon.  Dispense: 60 capsule; Refill: 0  Having again reminded the patient of the "off label" use of Phentermine beyond three consecutive months, and again discussing the risks, benefits, contraindications, and limitations of it's use; given it's role in the successful treatment of obesity thus far and lack of adverse effect, patient has expressed desire and given informed verbal consent to continue use.   I have consulted the [place] Controlled Substances Registry for this patient, and feel the risk/benefit ratio today is favorable for proceeding with this prescription for a controlled substance. The patient understands monitoring parameters and red flags.   3. Prediabetes Improving, but not optimized. Goal is HgbA1c < 5.7.  Medication: metformin 500 mg twice daily.  She will continue to focus on protein-rich, low simple carbohydrate foods. We reviewed the importance of hydration, regular exercise for stress reduction, and restorative sleep.   Lab Results  Component Value Date   HGBA1C 5.7 (H) 06/29/2020   Lab Results  Component Value Date  INSULIN 21.6 01/04/2020   INSULIN 14.9 09/14/2019   INSULIN 17.7 03/19/2019   INSULIN 17.4 10/13/2018   INSULIN 18.3 05/14/2018   4. Iron deficiency Katelyn Walker is taking ferrous sulfate 325 mg daily.  Nutrition: Iron-rich foods include dark leafy greens, red and white meats, eggs, seafood, and beans.  Certain foods and drinks prevent your body from absorbing iron properly.  Avoid eating these foods in the same meal as iron-rich foods or with iron supplements. These foods include: coffee, black tea, and red wine; milk, dairy products, and foods that are high in calcium; beans and soybeans; whole grains. Constipation can be a side effect of iron supplementation. Increased water and fiber intake are helpful. Water goal: > 2 liters/day. Fiber goal: > 25 grams/day.  5. B12 deficiency The diagnosis was reviewed with the patient. Counseling provided today, see below. We will continue to monitor.   Counseling . The body needs vitamin B12: to make red blood cells; to make DNA; and to help the nerves work properly so they can carry messages from the brain to the body.  . The main causes of vitamin B12 deficiency include dietary deficiency, digestive diseases, pernicious anemia, and having a surgery in which part of the stomach or small intestine is removed.  . Certain medicines can make it harder for the body to absorb vitamin B12. These medicines include: heartburn medications; some antibiotics; some medications used to treat diabetes, gout, and high cholesterol.  . In some cases, there are no symptoms of this condition. If the condition leads to anemia or nerve damage, various symptoms can occur, such as weakness or fatigue, shortness of breath, and numbness or tingling in your hands and feet.   . Treatment:  o May include taking vitamin B12 supplements.  o Avoid alcohol.  o Eat lots of healthy foods that contain vitamin B12: - Beef, pork, chicken, [place], and organ meats, such as liver.  - Seafood: This includes clams, rainbow trout, salmon, tuna, and haddock. Eggs.  - Cereal and dairy products that are fortified: This means that vitamin B12 has been added to the food.   Lab Results  Component Value Date   VITAMINB12 503 08/31/2020   6. Mixed hyperlipidemia Plan: Dietary changes: Increase soluble fiber. Decrease simple carbohydrates. Exercise changes: An average 40  minutes of moderate to vigorous-intensity aerobic activity 3 or 4 times per week.   Lab Results  Component Value Date   CHOL 180 06/29/2020   HDL 39 (L) 06/29/2020   LDLCALC 102 (H) 06/29/2020   TRIG 228 (H) 06/29/2020   CHOLHDL 4.6 (H) 06/29/2020   Lab Results  Component Value Date   ALT 6 08/31/2020   AST 12 08/31/2020   ALKPHOS 97 08/31/2020   BILITOT <0.2 08/31/2020   The 10-year ASCVD risk score Denman George DC Jr., et al., 2013) is: 2.1%   Values used to calculate the score:     Age: 28 years     Sex: Female     Is Non-Hispanic African American: No     Diabetic: No     Tobacco smoker: No     Systolic Blood Pressure: 124 mmHg     Is BP treated: Yes     HDL Cholesterol: 39 mg/dL     Total Cholesterol: 180 mg/dL  7. Gastroesophageal reflux disease with esophagitis without hemorrhage Brandalyn is taking omeprazole 40 mg daily.    We reviewed the diagnosis of GERD and the reasons why it was important to treat. We discussed "  red flag" symptoms and the importance of follow up if symptoms persisted despite treatment. We reviewed non-pharmacologic management of GERD symptoms: including: caffeine reduction, dietary changes, elevate HOB, NPO after supper, reduction of alcohol intake, tobacco cessation, and weight loss.  8. Essential hypertension, Rx HCTZ for mild LE edema, off Diovan with weight loss At goal. Medications: HCTZ 12.5 mg daily.   Plan: Avoid buying foods that are: processed, frozen, or prepackaged to avoid excess salt. We will continue to monitor symptoms as they relate to her weight loss journey.  BP Readings from Last 3 Encounters:  11/28/20 124/79  11/07/20 136/84  10/13/20 137/80   Lab Results  Component Value Date   CREATININE 0.98 08/31/2020   9. Other depression Katelyn Walker takes Wellbutrin XL 150 mg daily.  Behavior modification techniques were discussed today to help Katelyn Walker deal with her emotional/non-hunger eating behaviors.  Orders and follow up as documented  in patient record.   10. Restless sleeper Katelyn Walker takes trazodone 25-50 mg at bedtime as needed for sleep. We will continue to monitor symptoms as they relate to her weight loss journey.  11. Class 2 severe obesity with serious comorbidity and body mass index (BMI) of 37.0 to 37.9 in adult, unspecified obesity type (HCC)  Katelyn Walker is currently in the action stage of change. As such, her goal is to continue with weight loss efforts. She has agreed to keeping a food journal and adhering to recommended goals of 1200 calories and 90 protein.   Exercise goals: For substantial health benefits, adults should do at least 150 minutes (2 hours and 30 minutes) a week of moderate-intensity, or 75 minutes (1 hour and 15 minutes) a week of vigorous-intensity aerobic physical activity, or an equivalent combination of moderate- and vigorous-intensity aerobic activity. Aerobic activity should be performed in episodes of at least 10 minutes, and preferably, it should be spread throughout the week. Adults should also include muscle-strengthening activities that involve all major muscle groups on 2 or more days a week.  Behavioral modification strategies: increasing lean protein intake, decreasing simple carbohydrates, increasing vegetables, increasing water intake and planning for success.  Katelyn Walker has agreed to follow-up with our clinic in 3 weeks. She was informed of the importance of frequent follow-up visits to maximize her success with intensive lifestyle modifications for her multiple health conditions.  Objective:   VITALS: Per patient if applicable, see vitals. GENERAL: Alert and in no acute distress. CARDIOPULMONARY: No increased WOB. Speaking in clear sentences.  PSYCH: Pleasant and cooperative. Speech normal rate and rhythm. Affect is appropriate. Insight and judgement are appropriate. Attention is focused, linear, and appropriate.  NEURO: Oriented as arrived to appointment on time with no prompting.    Lab Results  Component Value Date   CREATININE 0.98 08/31/2020   BUN 10 08/31/2020   NA 141 08/31/2020   K 4.0 08/31/2020   CL 101 08/31/2020   CO2 24 08/31/2020   Lab Results  Component Value Date   ALT 6 08/31/2020   AST 12 08/31/2020   ALKPHOS 97 08/31/2020   BILITOT <0.2 08/31/2020   Lab Results  Component Value Date   HGBA1C 5.7 (H) 06/29/2020   HGBA1C 5.6 01/04/2020   HGBA1C 5.6 09/14/2019   HGBA1C 5.8 (H) 03/19/2019   HGBA1C 5.8 (H) 10/13/2018   Lab Results  Component Value Date   INSULIN 21.6 01/04/2020   INSULIN 14.9 09/14/2019   INSULIN 17.7 03/19/2019   INSULIN 17.4 10/13/2018   INSULIN 18.3 05/14/2018   Lab Results  Component Value Date   TSH 4.080 01/04/2020   Lab Results  Component Value Date   CHOL 180 06/29/2020   HDL 39 (L) 06/29/2020   LDLCALC 102 (H) 06/29/2020   TRIG 228 (H) 06/29/2020   CHOLHDL 4.6 (H) 06/29/2020   Lab Results  Component Value Date   WBC 11.5 (H) 08/31/2020   HGB 12.0 08/31/2020   HCT 37.5 08/31/2020   MCV 77 (L) 08/31/2020   PLT 385 08/31/2020   Lab Results  Component Value Date   IRON 21 (L) 08/31/2020   TIBC 369 08/31/2020   FERRITIN 15 08/31/2020   Attestation Statements:   Reviewed by clinician on day of visit: allergies, medications, problem list, medical history, surgical history, family history, social history, and previous encounter notes.  I, Insurance claims handler, CMA, am acting as transcriptionist for Helane Rima, DO  I have reviewed the above documentation for accuracy and completeness, and I agree with the above. Helane Rima, DO

## 2021-01-03 ENCOUNTER — Other Ambulatory Visit: Payer: Self-pay

## 2021-01-03 ENCOUNTER — Encounter (INDEPENDENT_AMBULATORY_CARE_PROVIDER_SITE_OTHER): Payer: Self-pay | Admitting: Family Medicine

## 2021-01-03 ENCOUNTER — Telehealth (INDEPENDENT_AMBULATORY_CARE_PROVIDER_SITE_OTHER): Payer: BC Managed Care – PPO | Admitting: Family Medicine

## 2021-01-03 DIAGNOSIS — R632 Polyphagia: Secondary | ICD-10-CM | POA: Diagnosis not present

## 2021-01-03 DIAGNOSIS — E66812 Obesity, class 2: Secondary | ICD-10-CM

## 2021-01-03 DIAGNOSIS — R7303 Prediabetes: Secondary | ICD-10-CM | POA: Diagnosis not present

## 2021-01-03 DIAGNOSIS — F3289 Other specified depressive episodes: Secondary | ICD-10-CM

## 2021-01-03 DIAGNOSIS — E611 Iron deficiency: Secondary | ICD-10-CM

## 2021-01-03 DIAGNOSIS — K2100 Gastro-esophageal reflux disease with esophagitis, without bleeding: Secondary | ICD-10-CM

## 2021-01-03 DIAGNOSIS — E559 Vitamin D deficiency, unspecified: Secondary | ICD-10-CM | POA: Diagnosis not present

## 2021-01-03 DIAGNOSIS — E538 Deficiency of other specified B group vitamins: Secondary | ICD-10-CM

## 2021-01-03 DIAGNOSIS — I10 Essential (primary) hypertension: Secondary | ICD-10-CM

## 2021-01-03 DIAGNOSIS — E782 Mixed hyperlipidemia: Secondary | ICD-10-CM

## 2021-01-03 DIAGNOSIS — Z6837 Body mass index (BMI) 37.0-37.9, adult: Secondary | ICD-10-CM

## 2021-01-03 DIAGNOSIS — G479 Sleep disorder, unspecified: Secondary | ICD-10-CM

## 2021-01-03 MED ORDER — VITAMIN D (ERGOCALCIFEROL) 1.25 MG (50000 UNIT) PO CAPS
50000.0000 [IU] | ORAL_CAPSULE | ORAL | 0 refills | Status: DC
Start: 2021-01-03 — End: 2021-01-30

## 2021-01-03 MED ORDER — PHENTERMINE HCL 15 MG PO CAPS: ORAL_CAPSULE | ORAL | 0 refills | Status: DC

## 2021-01-17 DIAGNOSIS — R6889 Other general symptoms and signs: Secondary | ICD-10-CM | POA: Diagnosis not present

## 2021-01-18 DIAGNOSIS — R6889 Other general symptoms and signs: Secondary | ICD-10-CM | POA: Diagnosis not present

## 2021-01-21 ENCOUNTER — Other Ambulatory Visit (INDEPENDENT_AMBULATORY_CARE_PROVIDER_SITE_OTHER): Payer: Self-pay | Admitting: Family Medicine

## 2021-01-21 DIAGNOSIS — G4709 Other insomnia: Secondary | ICD-10-CM

## 2021-01-30 ENCOUNTER — Ambulatory Visit (INDEPENDENT_AMBULATORY_CARE_PROVIDER_SITE_OTHER): Payer: BC Managed Care – PPO | Admitting: Family Medicine

## 2021-01-30 ENCOUNTER — Encounter (INDEPENDENT_AMBULATORY_CARE_PROVIDER_SITE_OTHER): Payer: Self-pay | Admitting: Family Medicine

## 2021-01-30 ENCOUNTER — Other Ambulatory Visit: Payer: Self-pay

## 2021-01-30 VITALS — BP 135/82 | HR 73 | Temp 98.1°F | Ht 67.0 in | Wt 238.0 lb

## 2021-01-30 DIAGNOSIS — E611 Iron deficiency: Secondary | ICD-10-CM | POA: Diagnosis not present

## 2021-01-30 DIAGNOSIS — Z9189 Other specified personal risk factors, not elsewhere classified: Secondary | ICD-10-CM

## 2021-01-30 DIAGNOSIS — Z20822 Contact with and (suspected) exposure to covid-19: Secondary | ICD-10-CM | POA: Diagnosis not present

## 2021-01-30 DIAGNOSIS — E559 Vitamin D deficiency, unspecified: Secondary | ICD-10-CM

## 2021-01-30 DIAGNOSIS — Z6837 Body mass index (BMI) 37.0-37.9, adult: Secondary | ICD-10-CM

## 2021-01-30 DIAGNOSIS — R7303 Prediabetes: Secondary | ICD-10-CM

## 2021-01-30 DIAGNOSIS — E538 Deficiency of other specified B group vitamins: Secondary | ICD-10-CM | POA: Diagnosis not present

## 2021-01-30 DIAGNOSIS — E782 Mixed hyperlipidemia: Secondary | ICD-10-CM

## 2021-01-30 DIAGNOSIS — R632 Polyphagia: Secondary | ICD-10-CM | POA: Diagnosis not present

## 2021-01-30 MED ORDER — PHENTERMINE HCL 15 MG PO CAPS
ORAL_CAPSULE | ORAL | 0 refills | Status: DC
Start: 2021-01-30 — End: 2021-03-06

## 2021-01-30 MED ORDER — VITAMIN D (ERGOCALCIFEROL) 1.25 MG (50000 UNIT) PO CAPS: 50000.0000 [IU] | ORAL_CAPSULE | ORAL | 0 refills | Status: DC

## 2021-01-30 NOTE — Progress Notes (Signed)
Chief Complaint:   OBESITY Katelyn Walker is here to discuss her progress with her obesity treatment plan along with follow-up of her obesity related diagnoses.   Today's visit was #: 47 Starting weight: 242 lbs Starting date: 05/14/2018 Today's weight: 238 lbs Today's date: 01/30/2021 Total lbs lost to date: 4 lbs Body mass index is 37.28 kg/m.  Total weight loss percentage to date: -1.65%  Interim History: Katelyn Walker endorses increased polyphagia and fatigue - off phentermine.  She hurt her left 5th toe, but it is improving.  She hopes to restart swimming tomorrow.  She says she has been sleeping better with taking phentermine at noon instead of at night.  RL improved.  Nutrition Plan: keeping a food journal and adhering to recommended goals of 1200 calories and 90 protein for 75% of the time. Anti-obesity medications: phentermine. Reported side effects: None. Activity: Swimming for 60 minutes 1-3 times per week.  Assessment/Plan:   1. Vitamin D deficiency Not optimized. Current vitamin D is 45.5, tested on 06/29/2020. Optimal goal > 50 ng/dL.   Plan: Continue to take prescription Vitamin D @50 ,000 IU every week as prescribed.  Will check vitamin D level today.  - Refill Vitamin D, Ergocalciferol, (DRISDOL) 1.25 MG (50000 UNIT) CAPS capsule; Take 1 capsule (50,000 Units total) by mouth every 7 (seven) days.  Dispense: 4 capsule; Refill: 0 - VITAMIN D 25 Hydroxy (Vit-D Deficiency, Fractures)  2. Polyphagia Controlled. Current treatment: phentermine 14 mg twice daily. Polyphagia refers to excessive feelings of hunger.  Plan:  Will refill phentermine today, as per below. She will continue to focus on protein-rich, low simple carbohydrate foods. We reviewed the importance of hydration, regular exercise for stress reduction, and restorative sleep.  - Refill phentermine 15 MG capsule; 1 in am and 1 at noon.  Dispense: 60 capsule; Refill: 0  Having again reminded the patient of the "off  label" use of Phentermine beyond three consecutive months, and again discussing the risks, benefits, contraindications, and limitations of it's use; given it's role in the successful treatment of obesity thus far and lack of adverse effect, patient has expressed desire and given informed verbal consent to continue use.   I have consulted the [place] Controlled Substances Registry for this patient, and feel the risk/benefit ratio today is favorable for proceeding with this prescription for a controlled substance. The patient understands monitoring parameters and red flags.   3. Exposure to COVID-19 virus would like COVID antibody testing today. Will check labs today, as per below.  We will continue to monitor.   - SAR CoV2 Serology (COVID 19)AB(IGG)IA  4. Prediabetes At goal. Goal is HgbA1c < 5.7.  Medication: metformin 500 mg twice daily.    Plan:  Continue metformin.  She will continue to focus on protein-rich, low simple carbohydrate foods. We reviewed the importance of hydration, regular exercise for stress reduction, and restorative sleep.  Will check A1c and insulin level today.  Lab Results  Component Value Date   HGBA1C 5.6 01/30/2021   Lab Results  Component Value Date   INSULIN 23.7 01/30/2021   INSULIN 21.6 01/04/2020   INSULIN 14.9 09/14/2019   INSULIN 17.7 03/19/2019   INSULIN 17.4 10/13/2018   - Hemoglobin A1c - Insulin, random  5. B12 deficiency Lab Results  Component Value Date   VITAMINB12 384 01/30/2021   Will check labs today, as per below.   - Anemia panel - CBC with Differential/Platelet  6. Iron deficiency Katelyn Walker is taking ferrous sulfate  325 mg daily.   Plan:  Continue iron supplement.  Will check labs today.  Nutrition: Iron-rich foods include dark leafy greens, red and white meats, eggs, seafood, and beans.  Certain foods and drinks prevent your body from absorbing iron properly. Avoid eating these foods in the same meal as iron-rich foods or with  iron supplements. These foods include: coffee, black tea, and red wine; milk, dairy products, and foods that are high in calcium; beans and soybeans; whole grains. Constipation can be a side effect of iron supplementation. Increased water and fiber intake are helpful. Water goal: > 2 liters/day. Fiber goal: > 25 grams/day.  - Anemia panel - CBC with Differential/Platelet  7. Mixed hyperlipidemia Course: Improving, but not optimized. Lipid-lowering medications: None.   Plan: Check lipid panel today.  Dietary changes: Increase soluble fiber, decrease simple carbohydrates, decrease saturated fat. Exercise changes: Moderate to vigorous-intensity aerobic activity 150 minutes per week or as tolerated. We will continue to monitor along with PCP/specialists as it pertains to her weight loss journey.  Lab Results  Component Value Date   CHOL 188 01/30/2021   HDL 50 01/30/2021   LDLCALC 113 (H) 01/30/2021   TRIG 142 01/30/2021   CHOLHDL 3.8 01/30/2021   Lab Results  Component Value Date   ALT 8 01/30/2021   AST 13 01/30/2021   ALKPHOS 90 01/30/2021   BILITOT <0.2 01/30/2021   The 10-year ASCVD risk score Denman George DC Jr., et al., 2013) is: 1.9%   Values used to calculate the score:     Age: 50 years     Sex: Female     Is Non-Hispanic African American: No     Diabetic: No     Tobacco smoker: No     Systolic Blood Pressure: 135 mmHg     Is BP treated: Yes     HDL Cholesterol: 50 mg/dL     Total Cholesterol: 188 mg/dL  - Comprehensive metabolic panel - Lipid panel  8. At risk for heart disease Due to Katelyn Walker's current state of health and medical condition(s), she is at a higher risk for heart disease.  This puts the patient at much greater risk to subsequently develop cardiopulmonary conditions that can significantly affect patient's quality of life in a negative manner.    At least 8 minutes were spent on counseling Katelyn Walker about these concerns today, and I stressed the importance of  reversing risks factors of obesity, especially truncal and visceral fat, hypertension, hyperlipidemia, and pre-diabetes.  The initial goal is to lose at least 5-10% of starting weight to help reduce these risk factors.  Counseling:  Intensive lifestyle modifications were discussed with Katelyn Walker as the most appropriate first line of treatment.  she will continue to work on diet, exercise, and weight loss efforts.  We will continue to reassess these conditions on a fairly regular basis in an attempt to decrease the patient's overall morbidity and mortality.  Evidence-based interventions for health behavior change were utilized today including the discussion of self monitoring techniques, problem-solving barriers, and SMART goal setting techniques.  Specifically, regarding patient's less desirable eating habits and patterns, we employed the technique of small changes when Katelyn Walker has not been able to fully commit to her prudent nutritional plan.  9. Class 2 severe obesity with serious comorbidity and body mass index (BMI) of 37.0 to 37.9 in adult, unspecified obesity type (HCC)  Course: Katelyn Walker is currently in the action stage of change. As such, her goal is to continue with weight  loss efforts.   Nutrition goals: She has agreed to keeping a food journal and adhering to recommended goals of 1200 calories and 90 grams of protein.   Exercise goals: For substantial health benefits, adults should do at least 150 minutes (2 hours and 30 minutes) a week of moderate-intensity, or 75 minutes (1 hour and 15 minutes) a week of vigorous-intensity aerobic physical activity, or an equivalent combination of moderate- and vigorous-intensity aerobic activity. Aerobic activity should be performed in episodes of at least 10 minutes, and preferably, it should be spread throughout the week.  Behavioral modification strategies: increasing lean protein intake, decreasing simple carbohydrates, increasing vegetables and increasing  water intake.  Katelyn Walker has agreed to follow-up with our clinic in 3 weeks. She was informed of the importance of frequent follow-up visits to maximize her success with intensive lifestyle modifications for her multiple health conditions.   Katelyn Walker was informed we would discuss her lab results at her next visit unless there is a critical issue that needs to be addressed sooner. Katelyn Walker agreed to keep her next visit at the agreed upon time to discuss these results.  Objective:   Blood pressure 135/82, pulse 73, temperature 98.1 F (36.7 C), temperature source Oral, height 5\' 7"  (1.702 m), weight 238 lb (108 kg), SpO2 98 %. Body mass index is 37.28 kg/m.  General: Cooperative, alert, well developed, in no acute distress. HEENT: Conjunctivae and lids unremarkable. Cardiovascular: Regular rhythm.  Lungs: Normal work of breathing. Neurologic: No focal deficits.   Lab Results  Component Value Date   CREATININE 0.98 08/31/2020   BUN 10 08/31/2020   NA 141 08/31/2020   K 4.0 08/31/2020   CL 101 08/31/2020   CO2 24 08/31/2020   Lab Results  Component Value Date   ALT 6 08/31/2020   AST 12 08/31/2020   ALKPHOS 97 08/31/2020   BILITOT <0.2 08/31/2020   Lab Results  Component Value Date   HGBA1C 5.7 (H) 06/29/2020   HGBA1C 5.6 01/04/2020   HGBA1C 5.6 09/14/2019   HGBA1C 5.8 (H) 03/19/2019   HGBA1C 5.8 (H) 10/13/2018   Lab Results  Component Value Date   INSULIN 21.6 01/04/2020   INSULIN 14.9 09/14/2019   INSULIN 17.7 03/19/2019   INSULIN 17.4 10/13/2018   INSULIN 18.3 05/14/2018   Lab Results  Component Value Date   TSH 4.080 01/04/2020   Lab Results  Component Value Date   CHOL 180 06/29/2020   HDL 39 (L) 06/29/2020   LDLCALC 102 (H) 06/29/2020   TRIG 228 (H) 06/29/2020   CHOLHDL 4.6 (H) 06/29/2020   Lab Results  Component Value Date   WBC 11.5 (H) 08/31/2020   HGB 12.0 08/31/2020   HCT 37.5 08/31/2020   MCV 77 (L) 08/31/2020   PLT 385 08/31/2020   Lab  Results  Component Value Date   IRON 21 (L) 08/31/2020   TIBC 369 08/31/2020   FERRITIN 15 08/31/2020   Attestation Statements:   Reviewed by clinician on day of visit: allergies, medications, problem list, medical history, surgical history, family history, social history, and previous encounter notes.  I, 09/02/2020, CMA, am acting as transcriptionist for Insurance claims handler, DO  I have reviewed the above documentation for accuracy and completeness, and I agree with the above. Helane Rima, DO

## 2021-01-31 LAB — CBC WITH DIFFERENTIAL/PLATELET
Basophils Absolute: 0.1 10*3/uL (ref 0.0–0.2)
Basos: 1 %
EOS (ABSOLUTE): 0.2 10*3/uL (ref 0.0–0.4)
Eos: 2 %
Hemoglobin: 12 g/dL (ref 11.1–15.9)
Immature Grans (Abs): 0 10*3/uL (ref 0.0–0.1)
Immature Granulocytes: 0 %
Lymphocytes Absolute: 1.9 10*3/uL (ref 0.7–3.1)
Lymphs: 21 %
MCH: 24.8 pg — ABNORMAL LOW (ref 26.6–33.0)
MCHC: 31.3 g/dL — ABNORMAL LOW (ref 31.5–35.7)
MCV: 79 fL (ref 79–97)
Monocytes Absolute: 0.4 10*3/uL (ref 0.1–0.9)
Monocytes: 5 %
Neutrophils Absolute: 6.6 10*3/uL (ref 1.4–7.0)
Neutrophils: 71 %
Platelets: 352 10*3/uL (ref 150–450)
RBC: 4.84 x10E6/uL (ref 3.77–5.28)
RDW: 15.5 % — ABNORMAL HIGH (ref 11.7–15.4)
WBC: 9.2 10*3/uL (ref 3.4–10.8)

## 2021-01-31 LAB — LIPID PANEL
Chol/HDL Ratio: 3.8 ratio (ref 0.0–4.4)
Cholesterol, Total: 188 mg/dL (ref 100–199)
HDL: 50 mg/dL (ref >39)
LDL Chol Calc (NIH): 113 mg/dL — ABNORMAL HIGH (ref 0–99)
Triglycerides: 142 mg/dL (ref 0–149)
VLDL Cholesterol Cal: 25 mg/dL (ref 5–40)

## 2021-01-31 LAB — HEMOGLOBIN A1C
Est. average glucose Bld gHb Est-mCnc: 114 mg/dL
Hgb A1c MFr Bld: 5.6 % (ref 4.8–5.6)

## 2021-01-31 LAB — COMPREHENSIVE METABOLIC PANEL
ALT: 8 IU/L (ref 0–32)
AST: 13 IU/L (ref 0–40)
Albumin/Globulin Ratio: 1.7 (ref 1.2–2.2)
Albumin: 4 g/dL (ref 3.8–4.8)
Alkaline Phosphatase: 90 IU/L (ref 44–121)
BUN/Creatinine Ratio: 14 (ref 9–23)
BUN: 12 mg/dL (ref 6–24)
Bilirubin Total: <0.2 mg/dL (ref 0.0–1.2)
CO2: 23 mmol/L (ref 20–29)
Calcium: 8.9 mg/dL (ref 8.7–10.2)
Chloride: 100 mmol/L (ref 96–106)
Creatinine, Ser: 0.85 mg/dL (ref 0.57–1.00)
GFR calc Af Amer: 92 mL/min/{1.73_m2} (ref >59)
GFR calc non Af Amer: 80 mL/min/{1.73_m2} (ref >59)
Globulin, Total: 2.4 g/dL (ref 1.5–4.5)
Glucose: 87 mg/dL (ref 65–99)
Potassium: 4.5 mmol/L (ref 3.5–5.2)
Sodium: 138 mmol/L (ref 134–144)
Total Protein: 6.4 g/dL (ref 6.0–8.5)

## 2021-01-31 LAB — ANEMIA PANEL
Ferritin: 11 ng/mL — ABNORMAL LOW (ref 15–150)
Folate, Hemolysate: 429 ng/mL
Folate, RBC: 1120 ng/mL (ref >498)
Hematocrit: 38.3 % (ref 34.0–46.6)
Iron Saturation: 9 % — CL (ref 15–55)
Iron: 35 ug/dL (ref 27–159)
Retic Ct Pct: 1.7 % (ref 0.6–2.6)
Total Iron Binding Capacity: 387 ug/dL (ref 250–450)
UIBC: 352 ug/dL (ref 131–425)
Vitamin B-12: 384 pg/mL (ref 232–1245)

## 2021-01-31 LAB — INSULIN, RANDOM: INSULIN: 23.7 u[IU]/mL (ref 2.6–24.9)

## 2021-01-31 LAB — VITAMIN D 25 HYDROXY (VIT D DEFICIENCY, FRACTURES): Vit D, 25-Hydroxy: 47 ng/mL (ref 30.0–100.0)

## 2021-02-01 ENCOUNTER — Encounter (INDEPENDENT_AMBULATORY_CARE_PROVIDER_SITE_OTHER): Payer: Self-pay | Admitting: Family Medicine

## 2021-02-03 ENCOUNTER — Encounter (INDEPENDENT_AMBULATORY_CARE_PROVIDER_SITE_OTHER): Payer: Self-pay | Admitting: Family Medicine

## 2021-02-06 NOTE — Telephone Encounter (Signed)
Dr.Wallace °

## 2021-02-07 LAB — SPECIMEN STATUS REPORT

## 2021-02-07 LAB — SAR COV2 SEROLOGY (COVID19)AB(IGG),IA
SARS-CoV-2 Semi-Quant IgG Ab: 265 AU/mL (ref <13.0)
SARS-CoV-2 Spike Ab Interp: POSITIVE

## 2021-03-06 ENCOUNTER — Encounter (INDEPENDENT_AMBULATORY_CARE_PROVIDER_SITE_OTHER): Payer: Self-pay | Admitting: Family Medicine

## 2021-03-06 ENCOUNTER — Other Ambulatory Visit: Payer: Self-pay

## 2021-03-06 ENCOUNTER — Telehealth (INDEPENDENT_AMBULATORY_CARE_PROVIDER_SITE_OTHER): Payer: BC Managed Care – PPO | Admitting: Family Medicine

## 2021-03-06 DIAGNOSIS — E559 Vitamin D deficiency, unspecified: Secondary | ICD-10-CM

## 2021-03-06 DIAGNOSIS — R790 Abnormal level of blood mineral: Secondary | ICD-10-CM

## 2021-03-06 DIAGNOSIS — Z6837 Body mass index (BMI) 37.0-37.9, adult: Secondary | ICD-10-CM

## 2021-03-06 DIAGNOSIS — R7303 Prediabetes: Secondary | ICD-10-CM

## 2021-03-06 DIAGNOSIS — R632 Polyphagia: Secondary | ICD-10-CM

## 2021-03-06 MED ORDER — VITAMIN D (ERGOCALCIFEROL) 1.25 MG (50000 UNIT) PO CAPS: 50000.0000 [IU] | ORAL_CAPSULE | ORAL | 0 refills | Status: DC

## 2021-03-06 MED ORDER — PHENTERMINE HCL 15 MG PO CAPS: ORAL_CAPSULE | ORAL | 0 refills | Status: DC

## 2021-03-06 NOTE — Progress Notes (Signed)
TeleHealth Visit:  Due to the COVID-19 pandemic, this visit was completed with telemedicine (audio/video) technology to reduce patient and provider exposure as well as to preserve personal protective equipment.   [name] has verbally consented to this TeleHealth visit. The patient is located at home, the provider is located at the [facility] office. The participants in this visit include the listed provider and patient and. The visit was conducted today via MyChart video.  OBESITY Katelyn Walker is here to discuss her progress with her obesity treatment plan along with follow-up of her obesity related diagnoses.   Today's visit was #: 48 Starting weight: 242 lbs Starting date: 05/14/2018 Today's date: 03/06/2021  Interim History: [name] says she is doing well with her protein.  She says she has a URI this week.  Needs to increase her water intake.  She reports doing less swimming due to toe pain.  Nutrition Plan: keeping a food journal and adhering to recommended goals of 1200 calories and 90 grams of protein for 80% of the time.  Anti-obesity medications: phentermine 15 mg daily. Reported side effects: None. Activity: Swimming 40 minutes once per week.  Assessment/Plan:   1. Low ferritin Cambrea is taking ferrous sulfate 325 mg daily.  Plan:  Continue iron supplement.  Will place referral to Hematology today.  CBC Latest Ref Rng & Units 01/30/2021 08/31/2020 06/29/2020  WBC 3.4 - 10.8 x10E3/uL 9.2 11.5(H) 9.4  Hemoglobin 11.1 - 15.9 g/dL 14.4 31.5 40.0  Hematocrit 34.0 - 46.6 % 38.3 37.5 35.6  Platelets 150 - 450 x10E3/uL 352 385 334   Lab Results  Component Value Date   IRON 35 01/30/2021   TIBC 387 01/30/2021   FERRITIN 11 (L) 01/30/2021   Lab Results  Component Value Date   VITAMINB12 384 01/30/2021   - Ambulatory referral to Hematology  2. Vitamin D deficiency At goal. Current vitamin D is 47.0, tested on 01/30/2021. Optimal goal > 50 ng/dL.  She is taking  vitamin D 50,000 IU weekly.  Plan: Continue to take prescription Vitamin D @50 ,000 IU every week as prescribed.  Follow-up for routine testing of Vitamin D, at least 2-3 times per year to avoid over-replacement.  - Refill Vitamin D, Ergocalciferol, (DRISDOL) 1.25 MG (50000 UNIT) CAPS capsule; Take 1 capsule (50,000 Units total) by mouth every 7 (seven) days.  Dispense: 4 capsule; Refill: 0  3. Polyphagia Controlled. Current treatment: phentermine 15 mg daily. Polyphagia refers to excessive feelings of hunger. She will continue to focus on protein-rich, low simple carbohydrate foods. We reviewed the importance of hydration, regular exercise for stress reduction, and restorative sleep.  - Refill phentermine 15 MG capsule; 1 in am and 1 at noon.  Dispense: 60 capsule; Refill: 0  Having again reminded the patient of the "off label" use of Phentermine beyond three consecutive months, and again discussing the risks, benefits, contraindications, and limitations of it's use; given it's role in the successful treatment of obesity thus far and lack of adverse effect, patient has expressed desire and given informed verbal consent to continue use.   I have consulted the [place] Controlled Substances Registry for this patient, and feel the risk/benefit ratio today is favorable for proceeding with this prescription for a controlled substance. The patient understands monitoring parameters and red flags.   4. Prediabetes A1c improved to goal.  Insulin level still elevated.  Goal is HgbA1c < 5.7.  Medication: metformin 500 mg twice daily.    Plan:  She will continue  to focus on protein-rich, low simple carbohydrate foods. We reviewed the importance of hydration, regular exercise for stress reduction, and restorative sleep.   Lab Results  Component Value Date   HGBA1C 5.6 01/30/2021   Lab Results  Component Value Date   INSULIN 23.7 01/30/2021   INSULIN 21.6 01/04/2020   INSULIN 14.9 09/14/2019   INSULIN 17.7  03/19/2019   INSULIN 17.4 10/13/2018   5. Class 2 severe obesity with serious comorbidity and body mass index (BMI) of 37.0 to 37.9 in adult, unspecified obesity type (HCC)  [name] is currently in the action stage of change. As such, her goal is to continue with weight loss efforts. She has agreed to keeping a food journal and adhering to recommended goals of 1200 calories and 90 grams of protein.   Exercise goals: As is.  Behavioral modification strategies: increasing lean protein intake, decreasing simple carbohydrates, increasing vegetables and increasing water intake.  [name] has agreed to follow-up with our clinic in 4 weeks. She was informed of the importance of frequent follow-up visits to maximize her success with intensive lifestyle modifications for her multiple health conditions.  Objective:   VITALS: Per patient if applicable, see vitals. GENERAL: Alert and in no acute distress. CARDIOPULMONARY: No increased WOB. Speaking in clear sentences.  PSYCH: Pleasant and cooperative. Speech normal rate and rhythm. Affect is appropriate. Insight and judgement are appropriate. Attention is focused, linear, and appropriate.  NEURO: Oriented as arrived to appointment on time with no prompting.   Lab Results  Component Value Date   CREATININE 0.85 01/30/2021   BUN 12 01/30/2021   NA 138 01/30/2021   K 4.5 01/30/2021   CL 100 01/30/2021   CO2 23 01/30/2021   Lab Results  Component Value Date   ALT 8 01/30/2021   AST 13 01/30/2021   ALKPHOS 90 01/30/2021   BILITOT <0.2 01/30/2021   Lab Results  Component Value Date   HGBA1C 5.6 01/30/2021   HGBA1C 5.7 (H) 06/29/2020   HGBA1C 5.6 01/04/2020   HGBA1C 5.6 09/14/2019   HGBA1C 5.8 (H) 03/19/2019   Lab Results  Component Value Date   INSULIN 23.7 01/30/2021   INSULIN 21.6 01/04/2020   INSULIN 14.9 09/14/2019   INSULIN 17.7 03/19/2019   INSULIN 17.4 10/13/2018   Lab Results  Component Value Date   TSH 4.080 01/04/2020    Lab Results  Component Value Date   CHOL 188 01/30/2021   HDL 50 01/30/2021   LDLCALC 113 (H) 01/30/2021   TRIG 142 01/30/2021   CHOLHDL 3.8 01/30/2021   Lab Results  Component Value Date   WBC 9.2 01/30/2021   HGB 12.0 01/30/2021   HCT 38.3 01/30/2021   MCV 79 01/30/2021   PLT 352 01/30/2021   Lab Results  Component Value Date   IRON 35 01/30/2021   TIBC 387 01/30/2021   FERRITIN 11 (L) 01/30/2021   Attestation Statements:   Reviewed by clinician on day of visit: allergies, medications, problem list, medical history, surgical history, family history, social history, and previous encounter notes.  I, Insurance claims handler, CMA, am acting as transcriptionist for Helane Rima, DO  I have reviewed the above documentation for accuracy and completeness, and I agree with the above. Helane Rima, DO

## 2021-03-07 ENCOUNTER — Telehealth: Payer: Self-pay | Admitting: Hematology and Oncology

## 2021-03-07 NOTE — Telephone Encounter (Signed)
Received anew hem referral from Dr. Earlene Plater for low ferritin. Katelyn Walker has been cld and scheduled to see Dr. Al Pimple on 3/29 at 1040am. Pt aware to arrive 20 minutes early.

## 2021-03-13 DIAGNOSIS — G43B0 Ophthalmoplegic migraine, not intractable: Secondary | ICD-10-CM | POA: Diagnosis not present

## 2021-03-13 DIAGNOSIS — H5213 Myopia, bilateral: Secondary | ICD-10-CM | POA: Diagnosis not present

## 2021-03-14 ENCOUNTER — Other Ambulatory Visit: Payer: Self-pay

## 2021-03-14 ENCOUNTER — Encounter: Payer: Self-pay | Admitting: Hematology and Oncology

## 2021-03-14 ENCOUNTER — Other Ambulatory Visit: Payer: BC Managed Care – PPO

## 2021-03-14 ENCOUNTER — Inpatient Hospital Stay: Payer: BC Managed Care – PPO | Attending: Hematology and Oncology | Admitting: Hematology and Oncology

## 2021-03-14 VITALS — BP 143/84 | HR 84 | Temp 98.0°F | Resp 17 | Ht 67.0 in | Wt 242.0 lb

## 2021-03-14 DIAGNOSIS — Z79899 Other long term (current) drug therapy: Secondary | ICD-10-CM | POA: Insufficient documentation

## 2021-03-14 DIAGNOSIS — Z833 Family history of diabetes mellitus: Secondary | ICD-10-CM

## 2021-03-14 DIAGNOSIS — Z7984 Long term (current) use of oral hypoglycemic drugs: Secondary | ICD-10-CM | POA: Insufficient documentation

## 2021-03-14 DIAGNOSIS — Z8249 Family history of ischemic heart disease and other diseases of the circulatory system: Secondary | ICD-10-CM

## 2021-03-14 DIAGNOSIS — E039 Hypothyroidism, unspecified: Secondary | ICD-10-CM | POA: Diagnosis not present

## 2021-03-14 DIAGNOSIS — E669 Obesity, unspecified: Secondary | ICD-10-CM

## 2021-03-14 DIAGNOSIS — E611 Iron deficiency: Secondary | ICD-10-CM | POA: Insufficient documentation

## 2021-03-14 DIAGNOSIS — E538 Deficiency of other specified B group vitamins: Secondary | ICD-10-CM | POA: Diagnosis not present

## 2021-03-14 NOTE — Progress Notes (Signed)
[facility] CONSULT NOTE  Patient Care Team: Laurann Montana, MD as PCP - General (Family Medicine)  CHIEF COMPLAINTS/PURPOSE OF CONSULTATION:  Iron deficiency  ASSESSMENT & PLAN:  No problem-specific Assessment & Plan notes found for this encounter.  No orders of the defined types were placed in this encounter.  This is a very pleasant 50 year old female patient with past medical history significant for small esophageal ulcer, esophageal stricture, iron deficiency referred to hematology for evaluation and recommendations for consideration of intravenous iron. She has been feeling fatigued, describes lightheadedness and pica.  She has been trying to take oral iron but there appears to be no significant improvement and hence she was referred to hematology for IV iron.  No current ongoing bleeding.  She does not describe any change in her bowel habits, menstrual cycles are regular and not heavy. Physical examination today quite unremarkable, obesity but otherwise no concerns. I reviewed her labs which demonstrate iron deficiency given her history of oral iron intake and no significant improvement, we discussed about IV iron.  We have discussed about intravenous Venofer after about 5 infusions.  We have discussed common side effects which include flulike symptoms and very rarely life-threatening anaphylaxis.  She is agreeable to these recommendations.  She will proceed with IV iron infusion and will return to clinic in 3 months with repeat labs.  She will report any change in her bowel habits to gastroenterology.  She mentioned a history of Barrett's esophagus hence I recommended that she talk to her gastroenterologist about the severity of Barrett's and the need for repeat endoscopies.  She expressed understanding.  She is otherwise up-to-date with age-appropriate cancer screening.  #2.  Vitamin B12 deficiency improved on oral vitamin B12 supplementation.  Thank you for consulting [name]  in the care of this patient.  Please not hesitate to contact us with any additional questions.  HISTORY OF PRESENTING ILLNESS:  Katelyn Walker 50 y.o. female is here because of low ferritin  This is a very pleasant 50 year old female patient with past medical history significant for esophageal stricture, esophageal ulcer, iron deficiency referred to hematology for further recommendations.  Patient arrived to the appointment today by herself. She has been feeling tired, has episodes of lightheadedness has been chewing ice and now her skin has been pale, she has been taking oral iron without much improvement.   She is also deficient in B12 and vitamin D and she wonders if there is a component of malabsorption. She denies any changes in her bowel movements.  She previously had bright red bleeding from her bowels.  She occasionally notices scant amount of blood when she wipes her bottom but no overt change in bowel habits.  She had her colonoscopy back in 2019 which did not show any pathology except for benign polyps. She does have regular menstrual cycles, uses birth control and does not describe them as heavy. She continues to swim 3 times a week and she swims a mile every time she swims. Rest of the pertinent 10 point ROS reviewed and negative.   REVIEW OF SYSTEMS:   Constitutional: Denies fevers, chills or abnormal night sweats Eyes: Denies blurriness of vision, double vision or watery eyes Ears, nose, mouth, throat, and face: Denies mucositis or sore throat Respiratory: Denies cough, dyspnea or wheezes Cardiovascular: Denies palpitation, chest discomfort or lower extremity swelling Gastrointestinal:  Denies nausea, heartburn or change in bowel habits Skin: Denies abnormal skin rashes Lymphatics: Denies new lymphadenopathy or easy bruising Neurological:Denies  numbness, tingling or new weaknesses Behavioral/Psych: Mood is stable, no new changes  All other systems were reviewed with the  patient and are negative.  MEDICAL HISTORY:  Past Medical History:  Diagnosis Date  . Back pain   . Dyspnea   . Elevated blood pressure reading without diagnosis of hypertension   . Esophageal ulcer   . GERD (gastroesophageal reflux disease)   . Hot flashes   . Hypothyroid   . Joint pain   . Sciatica, left side   . Vitamin B 12 deficiency   . Vitamin D deficiency     SURGICAL HISTORY: Past Surgical History:  Procedure Laterality Date  . DILATION AND CURETTAGE OF UTERUS     2011   . OVARIAN CYST SURGERY     2009, 2013    SOCIAL HISTORY: Social History   Socioeconomic History  . Marital status: Married    Spouse name: Sheliah Hatch  . Number of children: 1  . Years of education: Not on file  . Highest education level: Not on file  Occupational History  . 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 Smoking status: Never Smoker  . Smokeless tobacco: Never Used  Substance and Sexual Activity  . Alcohol use: Not on file  . Drug use: Not on file  . Sexual activity: Not on file  Other Topics Concern  . Not on file  Social History Narrative  . Not on file   Social Determinants of Health   Financial Resource Strain: Not on file  Food Insecurity: Not on file  Transportation Needs: Not on file  Physical Activity: Not on file  Stress: Not on file  Social Connections: Not on file  Intimate Partner Violence: Not on file    FAMILY HISTORY: Family History  Problem Relation Age of Onset  . Alcoholism Mother   . Eating disorder Mother   . Diabetes Father   . Hypertension Father   . Sleep apnea Father     ALLERGIES:  has No Known Allergies.  MEDICATIONS:  Current Outpatient Medications  Medication Sig Dispense Refill  . buPROPion (WELLBUTRIN XL) 150 MG 24 hr tablet Take 1 tablet (150 mg total) by mouth daily. 30 tablet 0  . ferrous sulfate 325 (65 FE) MG tablet Take 325 mg by mouth daily with breakfast.    . hydrochlorothiazide  (MICROZIDE) 12.5 MG capsule Take 1 capsule (12.5 mg total) by mouth daily. 30 capsule 0  . metFORMIN (GLUCOPHAGE) 500 MG tablet Take 1 tablet (500 mg total) by mouth 2 (two) times daily with a meal. 60 tablet 0  . naltrexone (DEPADE) 50 MG tablet Take 0.5 tablets (25 mg total) by mouth daily. 30 tablet 0  . Norgestimate-Eth Estradiol (ESTARYLLA PO) Take 28 mg by mouth.    Marland Kitchen omeprazole (PRILOSEC) 40 MG capsule Take 40 mg by mouth daily.    . phentermine 15 MG capsule 1 in am and 1 at noon. 60 capsule 0  . traZODone (DESYREL) 50 MG tablet Take 0.5-1 tablets (25-50 mg total) by mouth at bedtime as needed for sleep. 90 tablet 0  . Vitamin D, Ergocalciferol, (DRISDOL) 1.25 MG (50000 UNIT) CAPS capsule Take 1 capsule (50,000 Units total) by mouth every 7 (seven) days. 4 capsule 0   No current facility-administered medications for this visit.   PHYSICAL EXAMINATION:  ECOG PERFORMANCE STATUS: 0 - Asymptomatic  Vitals:   03/14/21 1030  BP: (!) 143/84  Pulse: 84  Resp: 17  Temp:  98 F (36.7 C)  SpO2: 100%   Filed Weights   03/14/21 1030  Weight: 242 lb (109.8 kg)    GENERAL:alert, no distress and comfortable SKIN: skin color, texture, turgor are normal, no rashes or significant lesions EYES: normal, conjunctiva are pink and non-injected, sclera clear OROPHARYNX:no exudate, no erythema and lips, buccal mucosa, and tongue normal  NECK: supple, thyroid normal size, non-tender, without nodularity LYMPH:  no palpable lymphadenopathy in the cervical, axillary or inguinal LUNGS: clear to auscultation and percussion with normal breathing effort HEART: regular rate & rhythm and no murmurs and no lower extremity edema ABDOMEN:abdomen soft, non-tender and normal bowel sounds Musculoskeletal:no cyanosis of digits and no clubbing  PSYCH: alert & oriented x 3 with fluent speech NEURO: no focal motor/sensory deficits  LABORATORY DATA:  I have reviewed the data as listed Lab Results  Component  Value Date   WBC 9.2 01/30/2021   HGB 12.0 01/30/2021   HCT 38.3 01/30/2021   MCV 79 01/30/2021   PLT 352 01/30/2021     Chemistry      Component Value Date/Time   NA 138 01/30/2021 0856   K 4.5 01/30/2021 0856   CL 100 01/30/2021 0856   CO2 23 01/30/2021 0856   BUN 12 01/30/2021 0856   CREATININE 0.85 01/30/2021 0856      Component Value Date/Time   CALCIUM 8.9 01/30/2021 0856   ALKPHOS 90 01/30/2021 0856   AST 13 01/30/2021 0856   ALT 8 01/30/2021 0856   BILITOT <0.2 01/30/2021 0856     I reviewed her labs, labs consistent with iron deficiency without any significant anemia.  RADIOGRAPHIC STUDIES: I have personally reviewed the radiological images as listed and agreed with the findings in the report. No results found.  All questions were answered. The patient knows to call the clinic with any problems, questions or concerns. I spent 45 minutes in the care of this patient including H and P, review of records, counseling and coordination of care.     Rachel Moulds, MD 03/14/2021 11:02 AM

## 2021-03-15 ENCOUNTER — Encounter: Payer: Self-pay | Admitting: Hematology and Oncology

## 2021-03-16 ENCOUNTER — Other Ambulatory Visit: Payer: Self-pay | Admitting: Hematology and Oncology

## 2021-03-21 ENCOUNTER — Inpatient Hospital Stay: Payer: BC Managed Care – PPO | Attending: Hematology and Oncology

## 2021-03-21 ENCOUNTER — Other Ambulatory Visit: Payer: Self-pay

## 2021-03-21 VITALS — BP 140/79 | HR 80 | Temp 98.3°F | Resp 18

## 2021-03-21 DIAGNOSIS — E538 Deficiency of other specified B group vitamins: Secondary | ICD-10-CM | POA: Insufficient documentation

## 2021-03-21 DIAGNOSIS — E611 Iron deficiency: Secondary | ICD-10-CM | POA: Diagnosis not present

## 2021-03-21 MED ORDER — SODIUM CHLORIDE 0.9 % IV SOLN
200.0000 mg | Freq: Once | INTRAVENOUS | Status: AC
  Administered 2021-03-21: 200 mg via INTRAVENOUS
  Filled 2021-03-21: qty 200

## 2021-03-21 MED ORDER — SODIUM CHLORIDE 0.9 % IV SOLN
Freq: Once | INTRAVENOUS | Status: AC
  Filled 2021-03-21: qty 250

## 2021-03-21 NOTE — Patient Instructions (Signed)

## 2021-03-23 ENCOUNTER — Inpatient Hospital Stay: Payer: BC Managed Care – PPO

## 2021-03-23 ENCOUNTER — Other Ambulatory Visit: Payer: Self-pay

## 2021-03-23 VITALS — BP 134/73 | HR 78 | Temp 98.3°F | Resp 19

## 2021-03-23 DIAGNOSIS — E538 Deficiency of other specified B group vitamins: Secondary | ICD-10-CM | POA: Diagnosis not present

## 2021-03-23 DIAGNOSIS — E611 Iron deficiency: Secondary | ICD-10-CM

## 2021-03-23 MED ORDER — SODIUM CHLORIDE 0.9 % IV SOLN
200.0000 mg | Freq: Once | INTRAVENOUS | Status: AC
  Administered 2021-03-23: 200 mg via INTRAVENOUS
  Filled 2021-03-23: qty 200

## 2021-03-23 MED ORDER — SODIUM CHLORIDE 0.9 % IV SOLN
Freq: Once | INTRAVENOUS | Status: AC
  Filled 2021-03-23: qty 250

## 2021-03-23 NOTE — Patient Instructions (Signed)

## 2021-03-27 ENCOUNTER — Inpatient Hospital Stay: Payer: BC Managed Care – PPO

## 2021-03-27 ENCOUNTER — Encounter: Payer: Self-pay | Admitting: Hematology and Oncology

## 2021-03-27 ENCOUNTER — Other Ambulatory Visit: Payer: Self-pay

## 2021-03-27 VITALS — BP 131/68 | HR 76 | Temp 98.1°F | Resp 17

## 2021-03-27 DIAGNOSIS — E611 Iron deficiency: Secondary | ICD-10-CM | POA: Diagnosis not present

## 2021-03-27 DIAGNOSIS — E538 Deficiency of other specified B group vitamins: Secondary | ICD-10-CM | POA: Diagnosis not present

## 2021-03-27 MED ORDER — SODIUM CHLORIDE 0.9 % IV SOLN
200.0000 mg | Freq: Once | INTRAVENOUS | Status: AC
  Administered 2021-03-27: 200 mg via INTRAVENOUS
  Filled 2021-03-27: qty 200

## 2021-03-27 MED ORDER — SODIUM CHLORIDE 0.9 % IV SOLN
Freq: Once | INTRAVENOUS | Status: AC
  Filled 2021-03-27: qty 250

## 2021-03-27 NOTE — Patient Instructions (Addendum)

## 2021-03-29 ENCOUNTER — Inpatient Hospital Stay: Payer: BC Managed Care – PPO

## 2021-03-29 ENCOUNTER — Other Ambulatory Visit: Payer: Self-pay

## 2021-03-29 VITALS — BP 135/70 | HR 76 | Resp 16

## 2021-03-29 DIAGNOSIS — E611 Iron deficiency: Secondary | ICD-10-CM

## 2021-03-29 DIAGNOSIS — E538 Deficiency of other specified B group vitamins: Secondary | ICD-10-CM | POA: Diagnosis not present

## 2021-03-29 MED ORDER — SODIUM CHLORIDE 0.9 % IV SOLN
200.0000 mg | Freq: Once | INTRAVENOUS | Status: AC
  Administered 2021-03-29: 200 mg via INTRAVENOUS
  Filled 2021-03-29: qty 10

## 2021-03-29 MED ORDER — SODIUM CHLORIDE 0.9 % IV SOLN
Freq: Once | INTRAVENOUS | Status: AC
  Filled 2021-03-29: qty 250

## 2021-03-29 NOTE — Patient Instructions (Signed)
[facility] Discharge Instructions for Patients Receiving Chemotherapy  Today you received the following chemotherapy agents venofer  To help prevent nausea and vomiting after your treatment, we encourage you to take your nausea medication as directed   If you develop nausea and vomiting that is not controlled by your nausea medication, call the clinic.   BELOW ARE SYMPTOMS THAT SHOULD BE REPORTED IMMEDIATELY:  *FEVER GREATER THAN 100.5 F  *CHILLS WITH OR WITHOUT FEVER  NAUSEA AND VOMITING THAT IS NOT CONTROLLED WITH YOUR NAUSEA MEDICATION  *UNUSUAL SHORTNESS OF BREATH  *UNUSUAL BRUISING OR BLEEDING  TENDERNESS IN MOUTH AND THROAT WITH OR WITHOUT PRESENCE OF ULCERS  *URINARY PROBLEMS  *BOWEL PROBLEMS  UNUSUAL RASH Items with * indicate a potential emergency and should be followed up as soon as possible.  Feel free to call the clinic should you have any questions or concerns. The clinic phone number is [phone].  Please show the CHEMO ALERT CARD at check-in to the Emergency Department and triage nurse.

## 2021-03-31 ENCOUNTER — Ambulatory Visit: Payer: BC Managed Care – PPO

## 2021-03-31 ENCOUNTER — Inpatient Hospital Stay: Payer: BC Managed Care – PPO

## 2021-03-31 ENCOUNTER — Other Ambulatory Visit: Payer: Self-pay

## 2021-03-31 VITALS — BP 130/71 | HR 73 | Temp 98.3°F | Resp 16

## 2021-03-31 DIAGNOSIS — E611 Iron deficiency: Secondary | ICD-10-CM | POA: Diagnosis not present

## 2021-03-31 DIAGNOSIS — E538 Deficiency of other specified B group vitamins: Secondary | ICD-10-CM | POA: Diagnosis not present

## 2021-03-31 MED ORDER — SODIUM CHLORIDE 0.9 % IV SOLN
Freq: Once | INTRAVENOUS | Status: AC
  Filled 2021-03-31: qty 250

## 2021-03-31 MED ORDER — SODIUM CHLORIDE 0.9 % IV SOLN
200.0000 mg | Freq: Once | INTRAVENOUS | Status: AC
  Administered 2021-03-31: 200 mg via INTRAVENOUS
  Filled 2021-03-31: qty 200

## 2021-03-31 NOTE — Patient Instructions (Signed)

## 2021-04-03 ENCOUNTER — Inpatient Hospital Stay: Payer: BC Managed Care – PPO

## 2021-04-05 ENCOUNTER — Ambulatory Visit: Payer: BC Managed Care – PPO

## 2021-04-07 ENCOUNTER — Ambulatory Visit: Payer: BC Managed Care – PPO

## 2021-04-11 ENCOUNTER — Ambulatory Visit (INDEPENDENT_AMBULATORY_CARE_PROVIDER_SITE_OTHER): Payer: BC Managed Care – PPO | Admitting: Family Medicine

## 2021-04-11 ENCOUNTER — Encounter (INDEPENDENT_AMBULATORY_CARE_PROVIDER_SITE_OTHER): Payer: Self-pay | Admitting: Family Medicine

## 2021-04-11 ENCOUNTER — Other Ambulatory Visit: Payer: Self-pay

## 2021-04-11 VITALS — BP 130/84 | HR 76 | Temp 97.8°F | Ht 67.0 in | Wt 239.0 lb

## 2021-04-11 DIAGNOSIS — Z9189 Other specified personal risk factors, not elsewhere classified: Secondary | ICD-10-CM | POA: Diagnosis not present

## 2021-04-11 DIAGNOSIS — E559 Vitamin D deficiency, unspecified: Secondary | ICD-10-CM | POA: Diagnosis not present

## 2021-04-11 DIAGNOSIS — E611 Iron deficiency: Secondary | ICD-10-CM

## 2021-04-11 DIAGNOSIS — G4709 Other insomnia: Secondary | ICD-10-CM | POA: Diagnosis not present

## 2021-04-11 DIAGNOSIS — R632 Polyphagia: Secondary | ICD-10-CM

## 2021-04-11 DIAGNOSIS — K2100 Gastro-esophageal reflux disease with esophagitis, without bleeding: Secondary | ICD-10-CM | POA: Diagnosis not present

## 2021-04-11 DIAGNOSIS — E66812 Obesity, class 2: Secondary | ICD-10-CM

## 2021-04-11 DIAGNOSIS — Z6837 Body mass index (BMI) 37.0-37.9, adult: Secondary | ICD-10-CM

## 2021-04-11 MED ORDER — VITAMIN D (ERGOCALCIFEROL) 1.25 MG (50000 UNIT) PO CAPS: 50000.0000 [IU] | ORAL_CAPSULE | ORAL | 0 refills | Status: DC

## 2021-04-11 MED ORDER — TRAZODONE HCL 50 MG PO TABS: 25.0000 mg | ORAL_TABLET | Freq: Every evening | ORAL | 0 refills | Status: AC | PRN

## 2021-04-11 MED ORDER — BUPROPION HCL ER (XL) 150 MG PO TB24: 150.0000 mg | ORAL_TABLET | Freq: Every day | ORAL | 0 refills | Status: DC

## 2021-04-11 MED ORDER — DEXLANSOPRAZOLE 60 MG PO CPDR: 60.0000 mg | DELAYED_RELEASE_CAPSULE | Freq: Every day | ORAL | 0 refills | Status: DC

## 2021-04-11 MED ORDER — NALTREXONE HCL 50 MG PO TABS
25.0000 mg | ORAL_TABLET | Freq: Every day | ORAL | 0 refills | Status: DC
Start: 2021-04-11 — End: 2021-09-04

## 2021-04-11 MED ORDER — PHENTERMINE HCL 15 MG PO CAPS
ORAL_CAPSULE | ORAL | 0 refills | Status: DC
Start: 2021-04-11 — End: 2021-05-11

## 2021-04-11 NOTE — Progress Notes (Signed)
Chief Complaint:   OBESITY Katelyn Walker is here to discuss her progress with her obesity treatment plan along with follow-up of her obesity related diagnoses.   Today's visit was #: 49 Starting weight: 242 lbs Starting date: 05/14/2018 Today's weight: 239 lbs Today's date: 04/11/2021 Total lbs lost to date: 3 lbs Body mass index is 37.43 kg/m.  Total weight loss percentage to date: -1.24%  Interim History:  Katelyn Walker is getting iron infusions and says she is feeling better already (5 infusions in 2 weeks).  She has not been swimming due to a broken pool heater.  EGD x2 showed Barrett's esophagus.  She will be on a PPI for life.    Current Meal Plan: keeping a food journal and adhering to recommended goals of 1200 calories and 90 grams of protein for 75% of the time.  Current Exercise Plan: swimming for 60 minutes 1 time per week. Current Anti-Obesity Medications: phentermine 15 mg twice daily. Side effects: None.  Assessment/Plan:   1. Polyphagia At goal. Current treatment: phentermine 15 mg twice daily, Wellbutrin XL 150 mg daily, and naltrexone 25 mg daily.Katelyn Walker refers to excessive feelings of hunger.  Plan:  Will refill medications, as per below.  She will continue to focus on protein-rich, low simple carbohydrate foods. We reviewed the importance of hydration, regular exercise for stress reduction, and restorative sleep.  - Refill buPROPion (WELLBUTRIN XL) 150 MG 24 hr tablet; Take 1 tablet (150 mg total) by mouth daily.  Dispense: 30 tablet; Refill: 0 - Refill naltrexone (DEPADE) 50 MG tablet; Take 0.5 tablets (25 mg total) by mouth daily.  Dispense: 30 tablet; Refill: 0 - Refill phentermine 15 MG capsule; 1 in am and 1 at noon.  Dispense: 60 capsule; Refill: 0  2. Vitamin D deficiency Improving, but not optimized. Current vitamin D is 47.0, tested on 01/30/2021. Optimal goal > 50 ng/dL.   Plan: Continue to take prescription Vitamin D @50 ,000 IU every week as prescribed.   Follow-up for routine testing of Vitamin D, at least 2-3 times per year to avoid over-replacement.  - Refill Vitamin D, Ergocalciferol, (DRISDOL) 1.25 MG (50000 UNIT) CAPS capsule; Take 1 capsule (50,000 Units total) by mouth every 7 (seven) days.  Dispense: 4 capsule; Refill: 0  3. Other insomnia This is moderately controlled. Current treatment: trazodone 25-50 mg at bedtime as needed for sleep.  Plan: Recommend sleep hygiene measures including regular sleep schedule, optimal sleep environment, and relaxing presleep rituals.  Will refill trazodone today.  - Refill traZODone (DESYREL) 50 MG tablet; Take 0.5-1 tablets (25-50 mg total) by mouth at bedtime as needed for sleep.  Dispense: 90 tablet; Refill: 0  4. Gastroesophageal reflux disease with esophagitis without hemorrhage EGD showed Barrett's esophagus, stricture, hiatal hernia, and ulcer.  She still has a cough.  Plan:  Will place referral to GI.  Also, she will start Dexilant 60 mg daily.  We reviewed the diagnosis of GERD and importance of treatment. We discussed "red flag" symptoms and the importance of follow up if symptoms persisted despite treatment. We reviewed non-pharmacologic management of GERD symptoms: including: caffeine reduction, dietary changes, elevate HOB, NPO after supper, reduction of alcohol intake, tobacco cessation, and weight loss.  - Start dexlansoprazole (DEXILANT) 60 MG capsule; Take 1 capsule (60 mg total) by mouth daily.  Dispense: 30 capsule; Refill: 0 - Ambulatory referral to Gastroenterology  5. Iron deficiency Katelyn Walker is status post iron infusions.  Nutrition: Iron-rich foods include dark leafy greens, red and  white meats, eggs, seafood, and beans.  Certain foods and drinks prevent your body from absorbing iron properly. Avoid eating these foods in the same meal as iron-rich foods or with iron supplements. These foods include: coffee, black tea, and red wine; milk, dairy products, and foods that are high  in calcium; beans and soybeans; whole grains. Constipation can be a side effect of iron supplementation. Increased water and fiber intake are helpful. Water goal: > 2 liters/day. Fiber goal: > 25 grams/day.  6. At risk for heart disease Due to Katelyn Walker's current state of health and medical condition(s), she is at a higher risk for heart disease.  This puts the patient at much greater risk to subsequently develop cardiopulmonary conditions that can significantly affect patient's quality of life in a negative manner.    At least 9 minutes were spent on counseling Katelyn Walker about these concerns today. Evidence-based interventions for health behavior change were utilized today including the discussion of self monitoring techniques, problem-solving barriers, and SMART goal setting techniques.  Specifically, regarding patient's less desirable eating habits and patterns, we employed the technique of small changes when Linder has not been able to fully commit to her prudent nutritional plan.  7. Obesity, current BMI 37.5  Course: Katelyn Walker is currently in the action stage of change. As such, her goal is to continue with weight loss efforts.   Nutrition goals: She has agreed to keeping a food journal and adhering to recommended goals of 1200 calories and 90 grams of protein.   Exercise goals: Increase swimming.  Behavioral modification strategies: increasing lean protein intake, decreasing simple carbohydrates, increasing vegetables and increasing water intake.  Katelyn Walker has agreed to follow-up with our clinic in 3-4 weeks. She was informed of the importance of frequent follow-up visits to maximize her success with intensive lifestyle modifications for her multiple health conditions.   Objective:   Blood pressure 130/84, pulse 76, temperature 97.8 F (36.6 C), temperature source Oral, height 5\' 7"  (1.702 m), weight 239 lb (108.4 kg), SpO2 98 %. Body mass index is 37.43 kg/m.  General: Cooperative, alert,  well developed, in no acute distress. HEENT: Conjunctivae and lids unremarkable. Cardiovascular: Regular rhythm.  Lungs: Normal work of breathing. Neurologic: No focal deficits.   Lab Results  Component Value Date   CREATININE 0.85 01/30/2021   BUN 12 01/30/2021   NA 138 01/30/2021   K 4.5 01/30/2021   CL 100 01/30/2021   CO2 23 01/30/2021   Lab Results  Component Value Date   ALT 8 01/30/2021   AST 13 01/30/2021   ALKPHOS 90 01/30/2021   BILITOT <0.2 01/30/2021   Lab Results  Component Value Date   HGBA1C 5.6 01/30/2021   HGBA1C 5.7 (H) 06/29/2020   HGBA1C 5.6 01/04/2020   HGBA1C 5.6 09/14/2019   HGBA1C 5.8 (H) 03/19/2019   Lab Results  Component Value Date   INSULIN 23.7 01/30/2021   INSULIN 21.6 01/04/2020   INSULIN 14.9 09/14/2019   INSULIN 17.7 03/19/2019   INSULIN 17.4 10/13/2018   Lab Results  Component Value Date   TSH 4.080 01/04/2020   Lab Results  Component Value Date   CHOL 188 01/30/2021   HDL 50 01/30/2021   LDLCALC 113 (H) 01/30/2021   TRIG 142 01/30/2021   CHOLHDL 3.8 01/30/2021   Lab Results  Component Value Date   WBC 9.2 01/30/2021   HGB 12.0 01/30/2021   HCT 38.3 01/30/2021   MCV 79 01/30/2021   PLT 352 01/30/2021   Lab  Results  Component Value Date   IRON 35 01/30/2021   TIBC 387 01/30/2021   FERRITIN 11 (L) 01/30/2021   Attestation Statements:   Reviewed by clinician on day of visit: allergies, medications, problem list, medical history, surgical history, family history, social history, and previous encounter notes.  I, Insurance claims handler, CMA, am acting as transcriptionist for Helane Rima, DO  I have reviewed the above documentation for accuracy and completeness, and I agree with the above. Helane Rima, DO

## 2021-04-13 ENCOUNTER — Encounter (INDEPENDENT_AMBULATORY_CARE_PROVIDER_SITE_OTHER): Payer: Self-pay | Admitting: Family Medicine

## 2021-04-13 DIAGNOSIS — K2100 Gastro-esophageal reflux disease with esophagitis, without bleeding: Secondary | ICD-10-CM

## 2021-04-17 ENCOUNTER — Encounter (INDEPENDENT_AMBULATORY_CARE_PROVIDER_SITE_OTHER): Payer: Self-pay | Admitting: Family Medicine

## 2021-04-17 MED ORDER — DEXLANSOPRAZOLE 60 MG PO CPDR: 60.0000 mg | DELAYED_RELEASE_CAPSULE | Freq: Every day | ORAL | 0 refills | Status: DC

## 2021-04-17 NOTE — Telephone Encounter (Signed)
Pt last seen by Dr. Wallace.  

## 2021-04-17 NOTE — Telephone Encounter (Signed)
For you -

## 2021-04-18 ENCOUNTER — Encounter: Payer: Self-pay | Admitting: Gastroenterology

## 2021-04-28 DIAGNOSIS — Z1231 Encounter for screening mammogram for malignant neoplasm of breast: Secondary | ICD-10-CM | POA: Diagnosis not present

## 2021-04-28 DIAGNOSIS — Z01419 Encounter for gynecological examination (general) (routine) without abnormal findings: Secondary | ICD-10-CM | POA: Diagnosis not present

## 2021-04-28 DIAGNOSIS — Z6838 Body mass index (BMI) 38.0-38.9, adult: Secondary | ICD-10-CM | POA: Diagnosis not present

## 2021-05-01 ENCOUNTER — Other Ambulatory Visit: Payer: Self-pay | Admitting: Obstetrics and Gynecology

## 2021-05-01 DIAGNOSIS — R928 Other abnormal and inconclusive findings on diagnostic imaging of breast: Secondary | ICD-10-CM

## 2021-05-03 ENCOUNTER — Telehealth: Payer: Self-pay | Admitting: Hematology and Oncology

## 2021-05-03 NOTE — Telephone Encounter (Signed)
R/s per provider request, left messge

## 2021-05-04 ENCOUNTER — Encounter (INDEPENDENT_AMBULATORY_CARE_PROVIDER_SITE_OTHER): Payer: Self-pay | Admitting: Family Medicine

## 2021-05-04 NOTE — Telephone Encounter (Signed)
Pt last seen by Dr. Wallace.  

## 2021-05-11 ENCOUNTER — Other Ambulatory Visit: Payer: Self-pay

## 2021-05-11 ENCOUNTER — Encounter (INDEPENDENT_AMBULATORY_CARE_PROVIDER_SITE_OTHER): Payer: Self-pay | Admitting: Family Medicine

## 2021-05-11 ENCOUNTER — Ambulatory Visit (INDEPENDENT_AMBULATORY_CARE_PROVIDER_SITE_OTHER): Payer: BC Managed Care – PPO | Admitting: Family Medicine

## 2021-05-11 VITALS — BP 131/84 | HR 78 | Temp 98.0°F | Ht 67.0 in | Wt 240.0 lb

## 2021-05-11 DIAGNOSIS — E611 Iron deficiency: Secondary | ICD-10-CM

## 2021-05-11 DIAGNOSIS — E559 Vitamin D deficiency, unspecified: Secondary | ICD-10-CM

## 2021-05-11 DIAGNOSIS — Z6837 Body mass index (BMI) 37.0-37.9, adult: Secondary | ICD-10-CM

## 2021-05-11 DIAGNOSIS — Z9189 Other specified personal risk factors, not elsewhere classified: Secondary | ICD-10-CM | POA: Diagnosis not present

## 2021-05-11 DIAGNOSIS — K2100 Gastro-esophageal reflux disease with esophagitis, without bleeding: Secondary | ICD-10-CM | POA: Diagnosis not present

## 2021-05-11 DIAGNOSIS — R632 Polyphagia: Secondary | ICD-10-CM

## 2021-05-11 MED ORDER — PHENTERMINE HCL 15 MG PO CAPS: ORAL_CAPSULE | ORAL | 0 refills | Status: DC

## 2021-05-11 MED ORDER — VITAMIN D (ERGOCALCIFEROL) 1.25 MG (50000 UNIT) PO CAPS: 50000.0000 [IU] | ORAL_CAPSULE | ORAL | 0 refills | Status: DC

## 2021-05-11 MED ORDER — ESOMEPRAZOLE MAGNESIUM 40 MG PO CPDR: 40.0000 mg | DELAYED_RELEASE_CAPSULE | Freq: Two times a day (BID) | ORAL | 3 refills | Status: DC

## 2021-05-11 MED ORDER — BUPROPION HCL ER (XL) 150 MG PO TB24: 150.0000 mg | ORAL_TABLET | Freq: Every day | ORAL | 0 refills | Status: DC

## 2021-05-12 LAB — ANEMIA PANEL
Ferritin: 234 ng/mL — ABNORMAL HIGH (ref 15–150)
Folate, Hemolysate: 491 ng/mL
Folate, RBC: 1175 ng/mL (ref >498)
Hematocrit: 41.8 % (ref 34.0–46.6)
Iron Saturation: 17 % (ref 15–55)
Iron: 44 ug/dL (ref 27–159)
Retic Ct Pct: 1.7 % (ref 0.6–2.6)
Total Iron Binding Capacity: 255 ug/dL (ref 250–450)
UIBC: 211 ug/dL (ref 131–425)
Vitamin B-12: 309 pg/mL (ref 232–1245)

## 2021-05-12 LAB — CBC WITH DIFFERENTIAL/PLATELET
Basophils Absolute: 0 10*3/uL (ref 0.0–0.2)
Basos: 0 %
EOS (ABSOLUTE): 0.2 10*3/uL (ref 0.0–0.4)
Eos: 2 %
Hemoglobin: 13.5 g/dL (ref 11.1–15.9)
Immature Grans (Abs): 0 10*3/uL (ref 0.0–0.1)
Immature Granulocytes: 0 %
Lymphocytes Absolute: 2.4 10*3/uL (ref 0.7–3.1)
Lymphs: 24 %
MCH: 26 pg — ABNORMAL LOW (ref 26.6–33.0)
MCHC: 32.3 g/dL (ref 31.5–35.7)
MCV: 81 fL (ref 79–97)
Monocytes Absolute: 0.5 10*3/uL (ref 0.1–0.9)
Monocytes: 6 %
Neutrophils Absolute: 6.6 10*3/uL (ref 1.4–7.0)
Neutrophils: 68 %
Platelets: 309 10*3/uL (ref 150–450)
RBC: 5.19 x10E6/uL (ref 3.77–5.28)
RDW: 15.8 % — ABNORMAL HIGH (ref 11.7–15.4)
WBC: 9.7 10*3/uL (ref 3.4–10.8)

## 2021-05-16 NOTE — Progress Notes (Signed)
Chief Complaint:   OBESITY Katelyn Walker is here to discuss her progress with her obesity treatment plan along with follow-up of her obesity related diagnoses.   Today's visit was #: 50  Starting weight: 242 lbs Starting date: 05/14/2018 Today's weight: 240 lbs Today's date: 05/11/2021 Weight change since last visit: +1 lb Total lbs lost to date: 2 lbs Body mass index is 37.59 kg/m.  Total weight loss percentage to date: -0.83%  Interim History: Katelyn Walker has a history of an abnormal mammogram, will be getting a diagnositic mammogram and ultrasound of her left breast done. Nutrition Plan: keeping a food journal and adhering to recommended goals of 1200 calories and 90 protein for 75% of the time. Activity: Swimming for 45 minutes 2 times per week. Anti-obesity medications: Wellbutrin XL 150 daily, Metformin 500 BID, Phentermine 15 BID, Naltrexone 25 daily. Reported side effects: None.  Assessment/Plan:   1. Polyphagia At goal. Current treatment: phentermine 15 mg twice daily, Wellbutrin XL 150 mg daily, and naltrexone 25 mg daily. Polyphagia refers to excessive feelings of hunger. She will continue to focus on protein-rich, low simple carbohydrate foods. We reviewed the importance of hydration, regular exercise for stress reduction, and restorative sleep.  - Refill buPROPion (WELLBUTRIN XL) 150 MG 24 hr tablet; Take 1 tablet (150 mg total) by mouth daily.  Dispense: 30 tablet; Refill: 0 - Refill phentermine 15 MG capsule; 1 in am and 1 at noon.  Dispense: 60 capsule; Refill: 0  2. Gastroesophageal reflux disease with esophagitis without hemorrhage We reviewed the diagnosis of GERD and importance of treatment. We discussed "red flag" symptoms and the importance of follow up if symptoms persisted despite treatment. We reviewed non-pharmacologic management of GERD symptoms: including: caffeine reduction, dietary changes, elevate HOB, NPO after supper, reduction of alcohol intake, tobacco  cessation, and weight loss.  3. Vitamin D deficiency Improving, but not optimized. Current vitamin D is 47, tested on 01/30/21. Optimal goal > 50 ng/dL. Plan: Continue to take prescription Vitamin D @50 ,000 IU every week as prescribed.  Follow-up for routine testing of Vitamin D, at least 2-3 times per year to avoid over-replacement.   - Refill Vitamin D, Ergocalciferol, (DRISDOL) 1.25 MG (50000 UNIT) CAPS capsule; Take 1 capsule (50,000 Units total) by mouth every 7 (seven) days.  Dispense: 4 capsule; Refill: 0  4. Iron deficiency Katelyn Walker is status post iron infusions.  Nutrition: Iron-rich foods include dark leafy greens, red and white meats, eggs, seafood, and beans.  Certain foods and drinks prevent your body from absorbing iron properly. Avoid eating these foods in the same meal as iron-rich foods or with iron supplements. These foods include: coffee, black tea, and red wine; milk, dairy products, and foods that are high in calcium; beans and soybeans; whole grains. Constipation can be a side effect of iron supplementation. Increased water and fiber intake are helpful. Water goal: > 2 liters/day. Fiber goal: > 25 grams/day.  Plan: We will check labs today.  - Anemia panel - CBC with Differential/Platelet  At risk for heart disease Due to Merdith's current state of health and medical condition(s), she is at a higher risk for heart disease.  This puts the patient at much greater risk to subsequently develop cardiopulmonary conditions that can significantly affect patient's quality of life in a negative manner.    At least 8 minutes were spent on counseling Katelyn Walker about these concerns today. Evidence-based interventions for health behavior change were utilized today including the discussion of self monitoring  techniques, problem-solving barriers, and SMART goal setting techniques.  Specifically, regarding patient's less desirable eating habits and patterns, we employed the technique of small  changes when Katelyn Walker has not been able to fully commit to her prudent nutritional plan.  Obesity, current BMI 37.6   Course: Katelyn Walker is currently in the action stage of change. As such, her goal is to continue with weight loss efforts.   Nutrition goals: She has agreed to keeping a food journal and adhering to recommended goals of 1200 calories and 90 protein.   Exercise goals: For substantial health benefits, adults should do at least 150 minutes (2 hours and 30 minutes) a week of moderate-intensity, or 75 minutes (1 hour and 15 minutes) a week of vigorous-intensity aerobic physical activity, or an equivalent combination of moderate- and vigorous-intensity aerobic activity. Aerobic activity should be performed in episodes of at least 10 minutes, and preferably, it should be spread throughout the week. Adults should also include muscle-strengthening activities that involve all major muscle groups on 2 or more days a week.  Behavioral modification strategies: increasing lean protein intake, decreasing simple carbohydrates, increasing vegetables and increasing water intake.  Dynesha has agreed to follow-up with our clinic in 2 weeks. She was informed of the importance of frequent follow-up visits to maximize her success with intensive lifestyle modifications for her multiple health conditions.   Objective:   Blood pressure 131/84, pulse 78, temperature 98 F (36.7 C), temperature source Oral, height 5\' 7"  (1.702 m), weight 240 lb (108.9 kg), SpO2 98 %. Body mass index is 37.59 kg/m.  General: Cooperative, alert, well developed, in no acute distress. HEENT: Conjunctivae and lids unremarkable. Cardiovascular: Regular rhythm.  Lungs: Normal work of breathing. Neurologic: No focal deficits.   Lab Results  Component Value Date   CREATININE 0.85 01/30/2021   BUN 12 01/30/2021   NA 138 01/30/2021   K 4.5 01/30/2021   CL 100 01/30/2021   CO2 23 01/30/2021   Lab Results  Component Value  Date   ALT 8 01/30/2021   AST 13 01/30/2021   ALKPHOS 90 01/30/2021   BILITOT <0.2 01/30/2021   Lab Results  Component Value Date   HGBA1C 5.6 01/30/2021   HGBA1C 5.7 (H) 06/29/2020   HGBA1C 5.6 01/04/2020   HGBA1C 5.6 09/14/2019   HGBA1C 5.8 (H) 03/19/2019   Lab Results  Component Value Date   INSULIN 23.7 01/30/2021   INSULIN 21.6 01/04/2020   INSULIN 14.9 09/14/2019   INSULIN 17.7 03/19/2019   INSULIN 17.4 10/13/2018   Lab Results  Component Value Date   TSH 4.080 01/04/2020   Lab Results  Component Value Date   CHOL 188 01/30/2021   HDL 50 01/30/2021   LDLCALC 113 (H) 01/30/2021   TRIG 142 01/30/2021   CHOLHDL 3.8 01/30/2021    Attestation Statements:   Reviewed by clinician on day of visit: allergies, medications, problem list, medical history, surgical history, family history, social history, and previous encounter notes.

## 2021-05-22 ENCOUNTER — Ambulatory Visit
Admission: RE | Admit: 2021-05-22 | Discharge: 2021-05-22 | Disposition: A | Discharge status: Home / Self Care | Payer: BC Managed Care – PPO | Source: Ambulatory Visit | Attending: Obstetrics and Gynecology | Admitting: Obstetrics and Gynecology

## 2021-05-22 ENCOUNTER — Other Ambulatory Visit: Payer: Self-pay

## 2021-05-22 ENCOUNTER — Other Ambulatory Visit: Payer: Self-pay | Admitting: Obstetrics and Gynecology

## 2021-05-22 DIAGNOSIS — R928 Other abnormal and inconclusive findings on diagnostic imaging of breast: Secondary | ICD-10-CM | POA: Diagnosis not present

## 2021-05-22 DIAGNOSIS — R922 Inconclusive mammogram: Secondary | ICD-10-CM | POA: Diagnosis not present

## 2021-05-22 DIAGNOSIS — N6320 Unspecified lump in the left breast, unspecified quadrant: Secondary | ICD-10-CM

## 2021-05-23 ENCOUNTER — Ambulatory Visit (INDEPENDENT_AMBULATORY_CARE_PROVIDER_SITE_OTHER): Payer: BC Managed Care – PPO | Admitting: Gastroenterology

## 2021-05-23 ENCOUNTER — Other Ambulatory Visit (INDEPENDENT_AMBULATORY_CARE_PROVIDER_SITE_OTHER): Payer: BC Managed Care – PPO

## 2021-05-23 ENCOUNTER — Encounter: Payer: Self-pay | Admitting: Gastroenterology

## 2021-05-23 VITALS — BP 136/80 | HR 95 | Ht 67.0 in | Wt 245.5 lb

## 2021-05-23 DIAGNOSIS — R1310 Dysphagia, unspecified: Secondary | ICD-10-CM

## 2021-05-23 DIAGNOSIS — K219 Gastro-esophageal reflux disease without esophagitis: Secondary | ICD-10-CM | POA: Diagnosis not present

## 2021-05-23 DIAGNOSIS — D509 Iron deficiency anemia, unspecified: Secondary | ICD-10-CM | POA: Diagnosis not present

## 2021-05-23 DIAGNOSIS — K449 Diaphragmatic hernia without obstruction or gangrene: Secondary | ICD-10-CM | POA: Diagnosis not present

## 2021-05-23 DIAGNOSIS — K2100 Gastro-esophageal reflux disease with esophagitis, without bleeding: Secondary | ICD-10-CM

## 2021-05-23 NOTE — Progress Notes (Signed)
Chief Complaint: GERD, iron deficiency anemia, dysphagia   Referring Provider:     Laurann Montana, MD   HPI:     Katelyn Walker is a 50 y.o. female with a history of obesity (BMI 37.6), hypothyroid, vitamin D deficiency, iron deficiency s/p IV iron infusions, referred to the Gastroenterology Clinic for evaluation of GERD.  Previously followed with Dr. Kinnie Scales and prior to that with Dr. Randa Evens at [facility].  Over 50 pages of prior documents reviewed today: - Reflux started in 2008 during pregnancy.  Index symptoms of heartburn, regurgitation - 2010: Developed intermittent solid food dysphagia.  Was seen by Dr. Randa Evens at [facility].  Started omeprazole 20 mg/day with overall improvement - Esophagram 09/2009: Small HH, mild ring at GE junction - 2015: Omeprazole changed to Zantac due to PPI concerns, with immediate breakthrough and eventually changed back to PPI. - EGD (12/2015, Dr. Kinnie Scales): 6 cm hiatal hernia, esophageal stricture dilated with 17 mm Savary.  Inflammatory changes on esophageal stricture biopsies.  LA Grade D esophagitis.  Started omeprazole 40 mg/day - EGD 02/2016 (Dr. Kinnie Scales): Resolved erosive esophagitis, large 5 cm hiatal hernia, nonobstructing distal esophageal stricture.  Continued omeprazole 40 mg/day with relief of reflux sxs then tapered to 20 mg/day in 2018, but breakthrough at 20 mg/day -Colonoscopy 08/2018.  Internal hemorrhoids, diverticulosis, sigmoid polyp (benign path), rectal polyp x2 (tubular adenomas).  5-year repeat per patient - Hemorrhoid banding recommended in 2019 by Dr. Kinnie Scales, but postponed d/t Covid concerns    Today, main issue is return of intermittent solid food dysphagia, now occurring 3-4 times/week.  Points to her suprasternal notch/upper sternum.  No history of food impactions requiring ER evaluation or emergent endoscopy.  Intentional weight loss but no fever, chills, night sweats.  Additionally, reflux symptoms have been  increasing, mainly increased throat clearing, a.m. nausea, chronic dry cough.  She continues to take omeprazole 40 mg/day and will take Tums  Prn breakthrough.  She has breakthrough of index reflux symptoms with any missed dose of omeprazole.  Currently taking after breakfast.  Was prescribed Nexium 40 mg/day, but not yet cleared by her insurance.  Sleeps with HOB elevated.  Additionally, history of iron deficiency anemia, treated with IV iron (Venofer) in April.  No BRBPR since 2019 following L-spine surgery/post op pain medications.   Labs from 05/11/2021 reviewed: - Hgb 13.5, MCV/RDW 81/15.8 - Ferritin 234, iron 44, TIBC 255, sat 17% - B12/folate 309/491  Labs from 01/30/2021 reviewed: - Hgb 12, MCV/RDW 79/15.5 - Ferritin 11, iron 35, TIBC 387, iron sat 9% - Normal B12/folate  Hgb/MCV nadir was 12/2019 at 10.9/74.  IDA dates back to at least 07/2018 on review of outside records.  She follows with Dr. Earlene Plater at [facility], currently treated with phentermine, Wellbutrin, and naltrexone.  Maternal uncle with Esophageal CA. Father with colon polyps. No known family history of CRC, liver disease, pancreatic disease, or IBD.     Past Medical History:  Diagnosis Date  . Back pain   . Dyspnea   . Elevated blood pressure reading without diagnosis of hypertension   . Esophageal ulcer   . GERD (gastroesophageal reflux disease)   . Hot flashes   . Hypothyroid   . Joint pain   . Sciatica, left side   . Vitamin B 12 deficiency   . Vitamin D deficiency      Past Surgical History:  Procedure Laterality Date  .  DILATION AND CURETTAGE OF UTERUS     2011   . OVARIAN CYST SURGERY     2009, 2013   Family History  Problem Relation Age of Onset  . Alcoholism Mother   . Eating disorder Mother   . Diabetes Father   . Hypertension Father   . Sleep apnea Father   . Esophageal cancer Maternal Uncle   . Colon cancer Neg Hx   . Pancreatic cancer Neg Hx   . Liver disease  Neg Hx   . Stomach cancer Neg Hx    Social History   Tobacco Use  . Smoking status: Never Smoker  . Smokeless tobacco: Never Used  Vaping Use  . Vaping Use: Never used  Substance Use Topics  . Alcohol use: Never  . Drug use: Never   Current Outpatient Medications  Medication Sig Dispense Refill  . buPROPion (WELLBUTRIN XL) 150 MG 24 hr tablet Take 1 tablet (150 mg total) by mouth daily. 30 tablet 0  . esomeprazole (NEXIUM) 40 MG capsule Take 1 capsule (40 mg total) by mouth 2 (two) times daily before a meal. 60 capsule 3  . hydrochlorothiazide (MICROZIDE) 12.5 MG capsule Take 1 capsule (12.5 mg total) by mouth daily. 30 capsule 0  . metFORMIN (GLUCOPHAGE) 500 MG tablet Take 1 tablet (500 mg total) by mouth 2 (two) times daily with a meal. 60 tablet 0  . naltrexone (DEPADE) 50 MG tablet Take 0.5 tablets (25 mg total) by mouth daily. 30 tablet 0  . Norgestimate-Eth Estradiol (ESTARYLLA PO) Take 28 mg by mouth.    . phentermine 15 MG capsule 1 in am and 1 at noon. 60 capsule 0  . traZODone (DESYREL) 50 MG tablet Take 0.5-1 tablets (25-50 mg total) by mouth at bedtime as needed for sleep. 90 tablet 0  . Vitamin D, Ergocalciferol, (DRISDOL) 1.25 MG (50000 UNIT) CAPS capsule Take 1 capsule (50,000 Units total) by mouth every 7 (seven) days. 4 capsule 0   No current facility-administered medications for this visit.   No Known Allergies   Review of Systems: All systems reviewed and negative except where noted in HPI.     Physical Exam:    Wt Readings from Last 3 Encounters:  05/23/21 245 lb 8 oz (111.4 kg)  05/11/21 240 lb (108.9 kg)  04/11/21 239 lb (108.4 kg)    BP 136/80   Pulse 95   Ht 5\' 7"  (1.702 m)   Wt 245 lb 8 oz (111.4 kg)   SpO2 98%   BMI 38.45 kg/m  Constitutional:  Pleasant, in no acute distress. Psychiatric: Normal mood and affect. Behavior is normal. EENT: Pupils normal.  Conjunctivae are normal. No scleral icterus. Neck supple. No cervical  LAD. Cardiovascular: Normal rate, regular rhythm. No edema Pulmonary/chest: Effort normal and breath sounds normal. No wheezing, rales or rhonchi. Abdominal: Soft, nondistended, nontender. Bowel sounds active throughout. There are no masses palpable. No hepatomegaly. Neurological: Alert and oriented to person place and time. Skin: Skin is warm and dry. No rashes noted.   ASSESSMENT AND PLAN;   1) Dysphagia - EGD with esophageal dilation - If EGD unrevealing, plan for Esophageal Manometry and possibly pH/impedance testing - Continue cutting food into small pieces, chew thoroughly, and drink plenty of fluids with meals  2) GERD with history of erosive esophagitis 3) Hiatal hernia - EGD to evaluate for size of hernia along with presence of erosive esophagitis, Barrett's Esophagus screening - Continue omeprazole for now, with potential change in medical  management pending endoscopic findings - As above, could consider pH/impedance testing pending EGD findings - Continue Tums for breakthrough  4) History of iron deficiency anemia - Evaluate for source of bleeding at time of upper endoscopy with duodenal biopsies - Check celiac serologies - Discussed possibility of medication ADR - If EGD and serologies unrevealing, could consider VCE - Colonoscopy was completed in 2019.  Holding off on repeat at this time -Follow-up with Hematology in July as scheduled  5) Internal hemorrhoids - Now rarely symptomatic - Continue conservative management for now  The indications, risks, and benefits of EGD were explained to the patient in detail. Risks include but are not limited to bleeding, perforation, adverse reaction to medications, and cardiopulmonary compromise. Sequelae include but are not limited to the possibility of surgery, hospitalization, and mortality. The patient verbalized understanding and wished to proceed. All questions answered, referred to scheduler. Further recommendations pending  results of the exam.      Shellia Cleverly, DO, FACG  05/23/2021, 8:26 AM   Laurann Montana, MD

## 2021-05-23 NOTE — Patient Instructions (Addendum)
If you are age 51 or older, your body mass index should be between 23-30. Your Body mass index is 38.45 kg/m. If this is out of the aforementioned range listed, please consider follow up with your Primary Care Provider.  If you are age 24 or younger, your body mass index should be between 19-25. Your Body mass index is 38.45 kg/m. If this is out of the aformentioned range listed, please consider follow up with your Primary Care Provider.   __________________________________________________________  The [facility] providers would like to encourage you to use [facility] to communicate with providers for non-urgent requests or questions.  Due to long hold times on the telephone, sending your provider a message by [facility] may be a faster and more efficient way to get a response.  Please allow 48 business hours for a response.  Please remember that this is for non-urgent requests.  ___________________________________________________________  Please go to the 2nd floor of this building today and schedule your labwork, [facility], [address]. _________________________________________________________ Due to recent changes in healthcare laws, you may see the results of your imaging and laboratory studies on MyChart before your provider has had a chance to review them.  We understand that in some cases there may be results that are confusing or concerning to you. Not all laboratory results come back in the same time frame and the provider may be waiting for multiple results in order to interpret others.  Please give [name] 48 hours in order for your provider to thoroughly review all the results before contacting the office for clarification of your results.    Thank you for choosing me and [facility].  Vito Cirigliano, D.O.

## 2021-05-24 ENCOUNTER — Other Ambulatory Visit: Payer: Self-pay

## 2021-05-24 ENCOUNTER — Encounter: Payer: Self-pay | Admitting: Hematology and Oncology

## 2021-05-24 ENCOUNTER — Ambulatory Visit
Admission: RE | Admit: 2021-05-24 | Discharge: 2021-05-24 | Disposition: A | Discharge status: Home / Self Care | Payer: BC Managed Care – PPO | Source: Ambulatory Visit | Attending: Obstetrics and Gynecology | Admitting: Obstetrics and Gynecology

## 2021-05-24 ENCOUNTER — Telehealth: Payer: Self-pay | Admitting: Hematology and Oncology

## 2021-05-24 DIAGNOSIS — N6012 Diffuse cystic mastopathy of left breast: Secondary | ICD-10-CM | POA: Diagnosis not present

## 2021-05-24 DIAGNOSIS — N6322 Unspecified lump in the left breast, upper inner quadrant: Secondary | ICD-10-CM | POA: Diagnosis not present

## 2021-05-24 DIAGNOSIS — N6320 Unspecified lump in the left breast, unspecified quadrant: Secondary | ICD-10-CM

## 2021-05-24 LAB — IGA: Immunoglobulin A: 171 mg/dL (ref 47–310)

## 2021-05-24 LAB — TISSUE TRANSGLUTAMINASE, IGA: (tTG) Ab, IgA: <1 U/mL

## 2021-05-24 NOTE — Telephone Encounter (Signed)
Scheduled appointment per 06/08 sch msg. Patient is aware. 

## 2021-05-26 DIAGNOSIS — U071 COVID-19: Secondary | ICD-10-CM | POA: Diagnosis not present

## 2021-06-05 ENCOUNTER — Encounter: Payer: Self-pay | Admitting: Hematology and Oncology

## 2021-06-08 ENCOUNTER — Encounter: Payer: Self-pay | Admitting: Gastroenterology

## 2021-06-08 ENCOUNTER — Encounter: Payer: Self-pay | Admitting: Hematology and Oncology

## 2021-06-09 ENCOUNTER — Encounter (INDEPENDENT_AMBULATORY_CARE_PROVIDER_SITE_OTHER): Payer: Self-pay | Admitting: Family Medicine

## 2021-06-12 ENCOUNTER — Ambulatory Visit (INDEPENDENT_AMBULATORY_CARE_PROVIDER_SITE_OTHER): Payer: BC Managed Care – PPO | Admitting: Family Medicine

## 2021-06-12 ENCOUNTER — Other Ambulatory Visit: Payer: Self-pay

## 2021-06-12 ENCOUNTER — Encounter: Payer: Self-pay | Admitting: Hematology and Oncology

## 2021-06-12 ENCOUNTER — Other Ambulatory Visit: Payer: BC Managed Care – PPO

## 2021-06-12 ENCOUNTER — Encounter (INDEPENDENT_AMBULATORY_CARE_PROVIDER_SITE_OTHER): Payer: Self-pay | Admitting: Family Medicine

## 2021-06-12 VITALS — BP 135/84 | HR 92 | Temp 98.1°F | Ht 67.0 in | Wt 242.0 lb

## 2021-06-12 DIAGNOSIS — E559 Vitamin D deficiency, unspecified: Secondary | ICD-10-CM | POA: Diagnosis not present

## 2021-06-12 DIAGNOSIS — R058 Other specified cough: Secondary | ICD-10-CM | POA: Diagnosis not present

## 2021-06-12 DIAGNOSIS — Z6837 Body mass index (BMI) 37.0-37.9, adult: Secondary | ICD-10-CM

## 2021-06-12 DIAGNOSIS — R7303 Prediabetes: Secondary | ICD-10-CM

## 2021-06-12 DIAGNOSIS — R632 Polyphagia: Secondary | ICD-10-CM

## 2021-06-12 DIAGNOSIS — E611 Iron deficiency: Secondary | ICD-10-CM

## 2021-06-12 DIAGNOSIS — R600 Localized edema: Secondary | ICD-10-CM | POA: Diagnosis not present

## 2021-06-12 DIAGNOSIS — Z9189 Other specified personal risk factors, not elsewhere classified: Secondary | ICD-10-CM

## 2021-06-12 MED ORDER — VITAMIN D (ERGOCALCIFEROL) 1.25 MG (50000 UNIT) PO CAPS: 50000.0000 [IU] | ORAL_CAPSULE | ORAL | 0 refills | Status: DC

## 2021-06-12 MED ORDER — PHENTERMINE HCL 15 MG PO CAPS: ORAL_CAPSULE | ORAL | 0 refills | Status: DC

## 2021-06-12 MED ORDER — HYDROCHLOROTHIAZIDE 12.5 MG PO CAPS: 12.5000 mg | ORAL_CAPSULE | Freq: Every day | ORAL | 0 refills | Status: DC

## 2021-06-12 MED ORDER — METFORMIN HCL 500 MG PO TABS: 500.0000 mg | ORAL_TABLET | Freq: Two times a day (BID) | ORAL | 0 refills | Status: DC

## 2021-06-13 ENCOUNTER — Inpatient Hospital Stay: Payer: BC Managed Care – PPO | Attending: Hematology and Oncology

## 2021-06-13 ENCOUNTER — Ambulatory Visit: Payer: BC Managed Care – PPO | Admitting: Hematology and Oncology

## 2021-06-13 ENCOUNTER — Encounter (INDEPENDENT_AMBULATORY_CARE_PROVIDER_SITE_OTHER): Payer: Self-pay | Admitting: Family Medicine

## 2021-06-13 ENCOUNTER — Encounter: Payer: Self-pay | Admitting: Hematology and Oncology

## 2021-06-13 DIAGNOSIS — E611 Iron deficiency: Secondary | ICD-10-CM | POA: Insufficient documentation

## 2021-06-13 DIAGNOSIS — E538 Deficiency of other specified B group vitamins: Secondary | ICD-10-CM | POA: Diagnosis not present

## 2021-06-13 LAB — CBC WITH DIFFERENTIAL (CANCER CENTER ONLY)
Abs Immature Granulocytes: 0.02 10*3/uL (ref 0.00–0.07)
Basophils Absolute: 0.1 10*3/uL (ref 0.0–0.1)
Basophils Relative: 1 %
Eosinophils Absolute: 0.3 10*3/uL (ref 0.0–0.5)
Eosinophils Relative: 3 %
HCT: 42.2 % (ref 36.0–46.0)
Hemoglobin: 13.9 g/dL (ref 12.0–15.0)
Immature Granulocytes: 0 %
Lymphocytes Relative: 23 %
Lymphs Abs: 2.3 10*3/uL (ref 0.7–4.0)
MCH: 26.9 pg (ref 26.0–34.0)
MCHC: 32.9 g/dL (ref 30.0–36.0)
MCV: 81.6 fL (ref 80.0–100.0)
Monocytes Absolute: 0.6 10*3/uL (ref 0.1–1.0)
Monocytes Relative: 6 %
Neutro Abs: 6.6 10*3/uL (ref 1.7–7.7)
Neutrophils Relative %: 67 %
Platelet Count: 291 10*3/uL (ref 150–400)
RBC: 5.17 MIL/uL — ABNORMAL HIGH (ref 3.87–5.11)
RDW: 14.4 % (ref 11.5–15.5)
WBC Count: 9.8 10*3/uL (ref 4.0–10.5)
nRBC: 0 % (ref 0.0–0.2)

## 2021-06-13 LAB — IRON AND TIBC
Iron: 72 ug/dL (ref 41–142)
Saturation Ratios: 25 % (ref 21–57)
TIBC: 286 ug/dL (ref 236–444)
UIBC: 214 ug/dL (ref 120–384)

## 2021-06-13 LAB — FERRITIN: Ferritin: 178 ng/mL (ref 11–307)

## 2021-06-14 MED ORDER — ALBUTEROL SULFATE HFA 108 (90 BASE) MCG/ACT IN AERS: 2.0000 | INHALATION_SPRAY | Freq: Four times a day (QID) | RESPIRATORY_TRACT | 2 refills | Status: DC | PRN

## 2021-06-14 NOTE — Telephone Encounter (Signed)
Last OV with Dr Wallace 

## 2021-06-15 NOTE — Progress Notes (Signed)
Chief Complaint:   OBESITY Katelyn Walker is here to discuss her progress with her obesity treatment plan along with follow-up of her obesity related diagnoses.   Today's visit was #: 51 Starting weight: 242 lbs Starting date: 05/14/2018 Today's weight: 242 lbs Today's date: 06/12/2021 Weight change since last visit: +2 lbs Total lbs lost to date: 0 Body mass index is 37.9 kg/m.   Interim History: Katelyn Walker had COVID 2 weeks ago. She finished the course of Paxlovid. She is scheduled for an EGD on 07/11/21. She reports drinking sweet tea and eating comfort foods. We will discuss medications at next visit if she feels stuck.  Current Meal Plan: keeping a food journal and adhering to recommended goals of 1200 calories and 90 grams protein for 75% of the time.  Current Exercise Plan: Swimming for 40 minutes 1 times per week. Current Anti-Obesity Medications: Wellbutrin XL 150 daily, Metformin 500 BID, Phentermine 15 BID, Naltrexone 25 daily. Side effects: None.  Assessment/Plan:   1. Post-viral cough syndrome Start Albuterol Sulfate 108 (90 Base) MCG/ACT 2 puffs Inhalation Every 6 hours PRN. Dispense Quantity: 8 g; Refills: 2  2. Vitamin D deficiency Not at goal. Plan: Continue to take prescription Vitamin D @50 ,000 IU every week as prescribed.  Follow-up for routine testing of Vitamin D, at least 2-3 times per year to avoid over-replacement.  Lab Results  Component Value Date   VD25OH 47.0 01/30/2021   VD25OH 45.5 06/29/2020   VD25OH 44.5 01/04/2020   - Refill Vitamin D, Ergocalciferol, (DRISDOL) 1.25 MG (50000 UNIT) CAPS capsule; Take 1 capsule (50,000 Units total) by mouth every 7 (seven) days.  Dispense: 4 capsule; Refill: 0  3. Bilateral lower extremity edema Will refill HCTZ, as per below.  - Refill hydrochlorothiazide (MICROZIDE) 12.5 MG capsule; Take 1 capsule (12.5 mg total) by mouth daily.  Dispense: 30 capsule; Refill: 0  4. Prediabetes At goal. Goal is HgbA1c < 5.7.   Medication: Metformin 500 mg BID.    Plan:  She will continue to focus on protein-rich, low simple carbohydrate foods. We reviewed the importance of hydration, regular exercise for stress reduction, and restorative sleep.   Lab Results  Component Value Date   HGBA1C 5.6 01/30/2021   Lab Results  Component Value Date   INSULIN 23.7 01/30/2021   INSULIN 21.6 01/04/2020   INSULIN 14.9 09/14/2019   INSULIN 17.7 03/19/2019   INSULIN 17.4 10/13/2018   - Refill metFORMIN (GLUCOPHAGE) 500 MG tablet; Take 1 tablet (500 mg total) by mouth 2 (two) times daily with a meal.  Dispense: 60 tablet; Refill: 0  5. Polyphagia At goal. Current treatment: phentermine 15 mg twice daily, Wellbutrin XL 150 mg daily, and naltrexone 25 mg daily. Polyphagia refers to excessive feelings of hunger. She will continue to focus on protein-rich, low simple carbohydrate foods. We reviewed the importance of hydration, regular exercise for stress reduction, and restorative sleep.  - Refill phentermine 15 MG capsule; 1 in am and 1 at noon.  Dispense: 60 capsule; Refill: 0  Having again reminded the patient of the "off label" use of Phentermine beyond three consecutive months, and again discussing the risks, benefits, contraindications, and limitations of it's use; given it's role in the successful treatment of obesity thus far and lack of adverse effect, patient has expressed desire and given informed verbal consent to continue use.   I have consulted the [place] Controlled Substances Registry for this patient, and feel the risk/benefit ratio today is favorable for proceeding  with this prescription for a controlled substance. The patient understands monitoring parameters and red flags.   6. At risk for heart disease Due to AmeLie's current state of health and medical condition(s), she is at a higher risk for heart disease.   This puts the patient at much greater risk to subsequently develop cardiopulmonary conditions that can  significantly affect patient's quality of life in a negative manner as well.    At least 9 minutes was spent on counseling Teneka about these concerns today. Initial goal is to lose at least 5-10% of starting weight to help reduce these risk factors.  We will continue to reassess these conditions on a fairly regular basis in an attempt to decrease patient's overall morbidity and mortality.  Evidence-based interventions for health behavior change were utilized today including the discussion of self monitoring techniques, problem-solving barriers and SMART goal setting techniques.  Specifically regarding patient's less desirable eating habits and patterns, we employed the technique of small changes when [place] has not been able to fully commit to her prudent nutritional plan.  7. Obesity, current BMI 38 Course: Tajuanna is currently in the action stage of change. As such, her goal is to continue with weight loss efforts.   Nutrition goals: She has agreed to keeping a food journal and adhering to recommended goals of 1200 calories and 90 grams of protein.   Exercise goals: For substantial health benefits, adults should do at least 150 minutes (2 hours and 30 minutes) a week of moderate-intensity, or 75 minutes (1 hour and 15 minutes) a week of vigorous-intensity aerobic physical activity, or an equivalent combination of moderate- and vigorous-intensity aerobic activity. Aerobic activity should be performed in episodes of at least 10 minutes, and preferably, it should be spread throughout the week.  Behavioral modification strategies: increasing lean protein intake, decreasing simple carbohydrates, increasing vegetables, and increasing water intake.  [name] has agreed to follow-up with our clinic in 4 weeks. She was informed of the importance of frequent follow-up visits to maximize her success with intensive lifestyle modifications for her multiple health conditions.   Objective:   Blood pressure  135/84, pulse 92, temperature 98.1 F (36.7 C), temperature source Oral, height 5\' 7"  (1.702 m), weight 242 lb (109.8 kg), SpO2 96 %. Body mass index is 37.9 kg/m.  General: Cooperative, alert, well developed, in no acute distress. HEENT: Conjunctivae and lids unremarkable. Cardiovascular: Regular rhythm.  Lungs: Normal work of breathing. Neurologic: No focal deficits.   Lab Results  Component Value Date   CREATININE 0.85 01/30/2021   BUN 12 01/30/2021   NA 138 01/30/2021   K 4.5 01/30/2021   CL 100 01/30/2021   CO2 23 01/30/2021   Lab Results  Component Value Date   ALT 8 01/30/2021   AST 13 01/30/2021   ALKPHOS 90 01/30/2021   BILITOT <0.2 01/30/2021   Lab Results  Component Value Date   HGBA1C 5.6 01/30/2021   HGBA1C 5.7 (H) 06/29/2020   HGBA1C 5.6 01/04/2020   HGBA1C 5.6 09/14/2019   HGBA1C 5.8 (H) 03/19/2019   Lab Results  Component Value Date   INSULIN 23.7 01/30/2021   INSULIN 21.6 01/04/2020   INSULIN 14.9 09/14/2019   INSULIN 17.7 03/19/2019   INSULIN 17.4 10/13/2018   Lab Results  Component Value Date   TSH 4.080 01/04/2020   Lab Results  Component Value Date   CHOL 188 01/30/2021   HDL 50 01/30/2021   LDLCALC 113 (H) 01/30/2021   TRIG 142 01/30/2021  CHOLHDL 3.8 01/30/2021   Lab Results  Component Value Date   VD25OH 47.0 01/30/2021   VD25OH 45.5 06/29/2020   VD25OH 44.5 01/04/2020   Lab Results  Component Value Date   WBC 9.8 06/13/2021   HGB 13.9 06/13/2021   HCT 42.2 06/13/2021   MCV 81.6 06/13/2021   PLT 291 06/13/2021   Lab Results  Component Value Date   IRON 72 06/13/2021   TIBC 286 06/13/2021   FERRITIN 178 06/13/2021   Attestation Statements:   Reviewed by clinician on day of visit: allergies, medications, problem list, medical history, surgical history, family history, social history, and previous encounter notes.  Carlos Levering Friedenbach, CMA, am acting as Energy manager for [employer], DO.  I have reviewed the  above documentation for accuracy and completeness, and I agree with the above. Helane Rima, DO

## 2021-06-16 ENCOUNTER — Encounter: Payer: Self-pay | Admitting: Hematology and Oncology

## 2021-06-20 ENCOUNTER — Encounter: Payer: Self-pay | Admitting: Hematology and Oncology

## 2021-06-20 ENCOUNTER — Inpatient Hospital Stay: Payer: BC Managed Care – PPO | Attending: Hematology and Oncology | Admitting: Hematology and Oncology

## 2021-06-20 ENCOUNTER — Other Ambulatory Visit: Payer: Self-pay

## 2021-06-20 VITALS — BP 132/82 | HR 85 | Temp 97.9°F | Resp 18 | Ht 67.0 in | Wt 246.3 lb

## 2021-06-20 DIAGNOSIS — E538 Deficiency of other specified B group vitamins: Secondary | ICD-10-CM | POA: Diagnosis not present

## 2021-06-20 DIAGNOSIS — E611 Iron deficiency: Secondary | ICD-10-CM | POA: Insufficient documentation

## 2021-06-20 DIAGNOSIS — Z79899 Other long term (current) drug therapy: Secondary | ICD-10-CM | POA: Insufficient documentation

## 2021-06-20 DIAGNOSIS — E559 Vitamin D deficiency, unspecified: Secondary | ICD-10-CM | POA: Diagnosis not present

## 2021-06-20 NOTE — Assessment & Plan Note (Signed)
She is currently on sublingual vitamin B12 supplementation.  We will repeat labs in December.  She can continue supplementation in the interim.

## 2021-06-20 NOTE — Assessment & Plan Note (Signed)
This is a very pleasant 50 year old female patient with past medical history significant for small esophageal ulcer, esophageal stricture, iron deficiency referred to hematology for evaluation and recommendations for consideration of intravenous iron. She received intravenous Venofer in April 2022 and tolerated it well.  She is here for follow-up.  Review of systems, noted resolution of pica, better energy levels, she is able to do more activities. Recent labs showed resolution of iron deficiency and anemia.  We will repeat labs in about 5 months and she will return to clinic for follow-up for any additional recommendations.

## 2021-06-20 NOTE — Assessment & Plan Note (Signed)
She is currently on vitamin D supplementation She would like to follow up on this. Repeat lab in Dec

## 2021-06-20 NOTE — Progress Notes (Signed)
[facility] CONSULT NOTE  Patient Care Team: Laurann Montana, MD as PCP - General (Family Medicine)  CHIEF COMPLAINTS/PURPOSE OF CONSULTATION:  Iron deficiency  ASSESSMENT & PLAN:   Iron deficiency This is a very pleasant 50 year old female patient with past medical history significant for small esophageal ulcer, esophageal stricture, iron deficiency referred to hematology for evaluation and recommendations for consideration of intravenous iron. She received intravenous Venofer in April 2022 and tolerated it well.  She is here for follow-up.  Review of systems, noted resolution of pica, better energy levels, she is able to do more activities. Recent labs showed resolution of iron deficiency and anemia.  We will repeat labs in about 5 months and she will return to clinic for follow-up for any additional recommendations.   B12 deficiency She is currently on sublingual vitamin B12 supplementation.  We will repeat labs in December.  She can continue supplementation in the interim.  Vitamin D deficiency She is currently on vitamin D supplementation She would like to follow up on this. Repeat lab in Dec  Orders Placed This Encounter  Procedures   CBC with Differential/Platelet    Standing Status:   Standing    Number of Occurrences:   22    Standing Expiration Date:   06/20/2022   Iron and TIBC    Standing Status:   Future    Standing Expiration Date:   06/20/2022   Ferritin    Standing Status:   Future    Standing Expiration Date:   06/20/2022   Vitamin B12    Standing Status:   Future    Standing Expiration Date:   06/20/2022   VITAMIN D 25 Hydroxy (Vit-D Deficiency, Fractures)    Standing Status:   Future    Standing Expiration Date:   06/20/2022      HISTORY OF PRESENTING ILLNESS:  Katelyn Walker 50 y.o. female is here because of low ferritin  This is a very pleasant 50 year old female patient with past medical history significant for esophageal stricture,  esophageal ulcer, iron deficiency referred to hematology for further recommendations. She received iron infusion in April 2022    REVIEW OF SYSTEMS:   Constitutional: Denies fevers, chills or abnormal night sweats Eyes: Denies blurriness of vision, double vision or watery eyes Ears, nose, mouth, throat, and face: Denies mucositis or sore throat Respiratory: Denies cough, dyspnea or wheezes Cardiovascular: Denies palpitation, chest discomfort or lower extremity swelling Gastrointestinal:  Denies nausea, heartburn or change in bowel habits Skin: Denies abnormal skin rashes Lymphatics: Denies new lymphadenopathy or easy bruising Neurological:Denies numbness, tingling or new weaknesses Behavioral/Psych: Mood is stable, no new changes  All other systems were reviewed with the patient and are negative.  MEDICAL HISTORY:  Past Medical History:  Diagnosis Date   Back pain    Dyspnea    Elevated blood pressure reading without diagnosis of hypertension    Esophageal ulcer    GERD (gastroesophageal reflux disease)    Hot flashes    Hypothyroid    Joint pain    Sciatica, left side    Vitamin B 12 deficiency    Vitamin D deficiency     SURGICAL HISTORY: Past Surgical History:  Procedure Laterality Date   DILATION AND CURETTAGE OF UTERUS     2011    OVARIAN CYST SURGERY     2009, 2013    SOCIAL HISTORY: Social History   Socioeconomic History   Marital status: Married    Spouse name: Sheliah Hatch  Number of children: 1   Years of education: Not on file   Highest education level: Not on file  Occupational History   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  Tobacco Use   Smoking status: Never   Smokeless tobacco: Never  Vaping Use   Vaping Use: Never used  Substance and Sexual Activity   Alcohol use: Never   Drug use: Never   Sexual activity: Yes  Other Topics Concern   Not on file  Social History Narrative   Not on file   Social Determinants of  Health   Financial Resource Strain: Not on file  Food Insecurity: Not on file  Transportation Needs: Not on file  Physical Activity: Not on file  Stress: Not on file  Social Connections: Not on file  Intimate Partner Violence: Not on file    FAMILY HISTORY: Family History  Problem Relation Age of Onset   Alcoholism Mother    Eating disorder Mother    Diabetes Father    Hypertension Father    Sleep apnea Father    Esophageal cancer Maternal Uncle    Colon cancer Neg Hx    Pancreatic cancer Neg Hx    Liver disease Neg Hx    Stomach cancer Neg Hx     ALLERGIES:  has No Known Allergies.  MEDICATIONS:  Current Outpatient Medications  Medication Sig Dispense Refill   albuterol (VENTOLIN HFA) 108 (90 Base) MCG/ACT inhaler Inhale 2 puffs into the lungs every 6 (six) hours as needed for wheezing or shortness of breath. 8 g 2   buPROPion (WELLBUTRIN XL) 150 MG 24 hr tablet Take 1 tablet (150 mg total) by mouth daily. 30 tablet 0   esomeprazole (NEXIUM) 40 MG capsule Take 1 capsule (40 mg total) by mouth 2 (two) times daily before a meal. 60 capsule 3   hydrochlorothiazide (MICROZIDE) 12.5 MG capsule Take 1 capsule (12.5 mg total) by mouth daily. 30 capsule 0   metFORMIN (GLUCOPHAGE) 500 MG tablet Take 1 tablet (500 mg total) by mouth 2 (two) times daily with a meal. 60 tablet 0   naltrexone (DEPADE) 50 MG tablet Take 0.5 tablets (25 mg total) by mouth daily. 30 tablet 0   Norgestimate-Eth Estradiol (ESTARYLLA PO) Take 28 mg by mouth.     phentermine 15 MG capsule 1 in am and 1 at noon. 60 capsule 0   traZODone (DESYREL) 50 MG tablet Take 0.5-1 tablets (25-50 mg total) by mouth at bedtime as needed for sleep. 90 tablet 0   Vitamin D, Ergocalciferol, (DRISDOL) 1.25 MG (50000 UNIT) CAPS capsule Take 1 capsule (50,000 Units total) by mouth every 7 (seven) days. 4 capsule 0   No current facility-administered medications for this visit.   PHYSICAL EXAMINATION:  ECOG PERFORMANCE STATUS:  0 - Asymptomatic  Vitals:   06/20/21 0925  BP: 132/82  Pulse: 85  Resp: 18  Temp: 97.9 F (36.6 C)  SpO2: 98%    Filed Weights   06/20/21 0925  Weight: 246 lb 4.8 oz (111.7 kg)     GENERAL:alert, no distress and comfortable SKIN: skin color, texture, turgor are normal, no rashes or significant lesions EYES: normal, conjunctiva are pink and non-injected, sclera clear OROPHARYNX:no exudate, no erythema and lips, buccal mucosa, and tongue normal  NECK: supple, thyroid normal size, non-tender, without nodularity LYMPH:  no palpable lymphadenopathy in the cervical, axillary or inguinal LUNGS: clear to auscultation and percussion with normal breathing effort HEART: regular rate & rhythm and no murmurs  and no lower extremity edema ABDOMEN:abdomen soft, non-tender and normal bowel sounds Musculoskeletal:no cyanosis of digits and no clubbing  PSYCH: alert & oriented x 3 with fluent speech NEURO: no focal motor/sensory deficits  LABORATORY DATA:  I have reviewed the data as listed Lab Results  Component Value Date   WBC 9.8 06/13/2021   HGB 13.9 06/13/2021   HCT 42.2 06/13/2021   MCV 81.6 06/13/2021   PLT 291 06/13/2021     Chemistry      Component Value Date/Time   NA 138 01/30/2021 0856   K 4.5 01/30/2021 0856   CL 100 01/30/2021 0856   CO2 23 01/30/2021 0856   BUN 12 01/30/2021 0856   CREATININE 0.85 01/30/2021 0856      Component Value Date/Time   CALCIUM 8.9 01/30/2021 0856   ALKPHOS 90 01/30/2021 0856   AST 13 01/30/2021 0856   ALT 8 01/30/2021 0856   BILITOT <0.2 01/30/2021 0856     I reviewed her labs, labs consistent with iron deficiency without any significant anemia.  RADIOGRAPHIC STUDIES: I have personally reviewed the radiological images as listed and agreed with the findings in the report. US BREAST LTD UNI LEFT INC AXILLA  Result Date: 05/22/2021 CLINICAL DATA:  50 year old female recalled from screening mammogram dated 04/28/2021 for a possible  left breast mass. EXAM: DIGITAL DIAGNOSTIC UNILATERAL LEFT MAMMOGRAM WITH TOMOSYNTHESIS AND CAD; ULTRASOUND LEFT BREAST LIMITED TECHNIQUE: Left digital diagnostic mammography and breast tomosynthesis was performed. The images were evaluated with computer-aided detection.; Targeted ultrasound examination of the left breast was performed COMPARISON:  Previous exam(s). ACR Breast Density Category b: There are scattered areas of fibroglandular density. FINDINGS: There is a persistent round, partially circumscribed partially obscured mass in the central left breast at mid depth. Further evaluation with ultrasound was performed. Targeted ultrasound is performed, showing no focal or suspicious sonographic abnormality corresponding with the mammographic finding. Note is made of mildly dilated ducts in the retroareolar left breast. However, no focal correlate the mammographically identified mass is demonstrated. Evaluation of the left axilla demonstrates no suspicious lymphadenopathy. IMPRESSION: 1. Indeterminate left breast mass without ultrasound correlate. Recommendation is for stereotactic biopsy. 2. No suspicious left axillary lymphadenopathy. RECOMMENDATION: Stereotactic biopsy of the left breast. I have discussed the findings and recommendations with the patient. If applicable, a reminder letter will be sent to the patient regarding the next appointment. BI-RADS CATEGORY  4: Suspicious. Electronically Signed   By: Sande BrothersSerena  Chacko M.D.   On: 05/22/2021 08:57   MM DIAG BREAST TOMO UNI LEFT  Result Date: 05/22/2021 CLINICAL DATA:  50 year old female recalled from screening mammogram dated 04/28/2021 for a possible left breast mass. EXAM: DIGITAL DIAGNOSTIC UNILATERAL LEFT MAMMOGRAM WITH TOMOSYNTHESIS AND CAD; ULTRASOUND LEFT BREAST LIMITED TECHNIQUE: Left digital diagnostic mammography and breast tomosynthesis was performed. The images were evaluated with computer-aided detection.; Targeted ultrasound examination of  the left breast was performed COMPARISON:  Previous exam(s). ACR Breast Density Category b: There are scattered areas of fibroglandular density. FINDINGS: There is a persistent round, partially circumscribed partially obscured mass in the central left breast at mid depth. Further evaluation with ultrasound was performed. Targeted ultrasound is performed, showing no focal or suspicious sonographic abnormality corresponding with the mammographic finding. Note is made of mildly dilated ducts in the retroareolar left breast. However, no focal correlate the mammographically identified mass is demonstrated. Evaluation of the left axilla demonstrates no suspicious lymphadenopathy. IMPRESSION: 1. Indeterminate left breast mass without ultrasound correlate. Recommendation is for stereotactic biopsy.  2. No suspicious left axillary lymphadenopathy. RECOMMENDATION: Stereotactic biopsy of the left breast. I have discussed the findings and recommendations with the patient. If applicable, a reminder letter will be sent to the patient regarding the next appointment. BI-RADS CATEGORY  4: Suspicious. Electronically Signed   By: Sande Brothers M.D.   On: 05/22/2021 08:57   MM CLIP PLACEMENT LEFT  Result Date: 05/24/2021 CLINICAL DATA:  Status post stereotactic biopsy for a 3 mm mass in the LEFT breast. EXAM: DIAGNOSTIC LEFT MAMMOGRAM POST STEREOTACTIC BIOPSY COMPARISON:  Previous exam(s). FINDINGS: Mammographic images were obtained following stereotactic guided biopsy of a 3 mm mass within the central LEFT breast, slightly upper inner quadrant. The biopsy marking clip is in expected position at the site of biopsy. IMPRESSION: Appropriate positioning of the X shaped biopsy marking clip at the site of biopsy in the slightly upper inner quadrant of the LEFT breast. Final Assessment: Post Procedure Mammograms for Marker Placement Electronically Signed   By: Bary Richard M.D.   On: 05/24/2021 12:14   MM LT BREAST BX W LOC DEV 1ST  LESION IMAGE BX SPEC STEREO GUIDE  Addendum Date: 05/25/2021   ADDENDUM REPORT: 05/25/2021 14:37 ADDENDUM: Pathology revealed FIBROCYSTIC CHANGE WITH APOCRINE METAPLASIA- NO MALIGNANCY IDENTIFIED of the LEFT breast, upper inner quadrant. This was found to be concordant by Dr. Bary Richard. Pathology results were discussed with the patient by telephone. The patient reported doing well after the biopsy with tenderness at the site. Post biopsy instructions and care were reviewed and questions were answered. The patient was encouraged to call [facility] for any additional concerns. The patient was instructed to return for annual screening mammography and informed a reminder notice would be sent regarding this appointment. Pathology results reported by Collene Mares RN on 05/25/2021. Electronically Signed   By: Bary Richard M.D.   On: 05/25/2021 14:37   Result Date: 05/25/2021 CLINICAL DATA:  Patient with a LEFT breast mass presents today for stereotactic biopsy using tomosynthesis guidance. EXAM: LEFT BREAST STEREOTACTIC CORE NEEDLE BIOPSY COMPARISON:  Previous exams. FINDINGS: The patient and I discussed the procedure of stereotactic-guided biopsy including benefits and alternatives. We discussed the high likelihood of a successful procedure. We discussed the risks of the procedure including infection, bleeding, tissue injury, clip migration, and inadequate sampling. Informed written consent was given. The usual time out protocol was performed immediately prior to the procedure. Using sterile technique and 1% Lidocaine as local anesthetic, under stereotactic guidance, a 9 gauge vacuum assisted device was used to perform core needle biopsy of an indeterminate mass in the central left breast, slightly upper inner quadrant, using a superior approach. Lesion quadrant: Upper inner quadrant At the conclusion of the procedure, X shaped tissue marker clip was deployed into the biopsy cavity.  Follow-up 2-view mammogram was performed and dictated separately. IMPRESSION: Stereotactic-guided biopsy of a small indeterminate mass within the central LEFT breast, slightly upper inner quadrant. Small post biopsy hematoma. No other complications. Electronically Signed: By: Bary Richard M.D. On: 05/24/2021 12:06   All questions were answered. The patient knows to call the clinic with any problems, questions or concerns.     Rachel Moulds, MD 06/20/2021 9:56 AM

## 2021-06-27 ENCOUNTER — Encounter: Payer: Self-pay | Admitting: Hematology and Oncology

## 2021-07-03 ENCOUNTER — Encounter: Payer: Self-pay | Admitting: Hematology and Oncology

## 2021-07-04 ENCOUNTER — Encounter: Payer: Self-pay | Admitting: Hematology and Oncology

## 2021-07-04 ENCOUNTER — Ambulatory Visit (INDEPENDENT_AMBULATORY_CARE_PROVIDER_SITE_OTHER): Payer: BC Managed Care – PPO | Admitting: Family Medicine

## 2021-07-04 ENCOUNTER — Telehealth: Payer: Self-pay | Admitting: Hematology and Oncology

## 2021-07-04 ENCOUNTER — Other Ambulatory Visit: Payer: Self-pay

## 2021-07-04 ENCOUNTER — Encounter (INDEPENDENT_AMBULATORY_CARE_PROVIDER_SITE_OTHER): Payer: Self-pay | Admitting: Family Medicine

## 2021-07-04 VITALS — BP 126/88 | HR 75 | Temp 98.0°F | Ht 67.0 in | Wt 242.0 lb

## 2021-07-04 DIAGNOSIS — F3289 Other specified depressive episodes: Secondary | ICD-10-CM | POA: Diagnosis not present

## 2021-07-04 DIAGNOSIS — R7301 Impaired fasting glucose: Secondary | ICD-10-CM | POA: Diagnosis not present

## 2021-07-04 DIAGNOSIS — Z6837 Body mass index (BMI) 37.0-37.9, adult: Secondary | ICD-10-CM | POA: Diagnosis not present

## 2021-07-04 DIAGNOSIS — D508 Other iron deficiency anemias: Secondary | ICD-10-CM | POA: Diagnosis not present

## 2021-07-04 DIAGNOSIS — Z9189 Other specified personal risk factors, not elsewhere classified: Secondary | ICD-10-CM

## 2021-07-04 MED ORDER — TIRZEPATIDE 2.5 MG/0.5ML ~~LOC~~ SOAJ: 2.5000 mg | SUBCUTANEOUS | 0 refills | Status: DC

## 2021-07-04 MED ORDER — BUPROPION HCL ER (XL) 150 MG PO TB24: 150.0000 mg | ORAL_TABLET | Freq: Every day | ORAL | 0 refills | Status: DC

## 2021-07-04 NOTE — Telephone Encounter (Signed)
Scheduled appts per 7/19 sch msg. Called pt, no answer. Left msg with updated appts dates and times.

## 2021-07-06 ENCOUNTER — Other Ambulatory Visit: Payer: Self-pay

## 2021-07-06 MED ORDER — OMEPRAZOLE 40 MG PO CPDR: 40.0000 mg | DELAYED_RELEASE_CAPSULE | Freq: Every day | ORAL | 6 refills | Status: DC

## 2021-07-11 ENCOUNTER — Encounter: Payer: BC Managed Care – PPO | Admitting: Gastroenterology

## 2021-07-11 DIAGNOSIS — S82832A Other fracture of upper and lower end of left fibula, initial encounter for closed fracture: Secondary | ICD-10-CM | POA: Diagnosis not present

## 2021-07-11 DIAGNOSIS — S9305XA Dislocation of left ankle joint, initial encounter: Secondary | ICD-10-CM | POA: Diagnosis not present

## 2021-07-11 DIAGNOSIS — M25562 Pain in left knee: Secondary | ICD-10-CM | POA: Diagnosis not present

## 2021-07-11 DIAGNOSIS — M25572 Pain in left ankle and joints of left foot: Secondary | ICD-10-CM | POA: Diagnosis not present

## 2021-07-12 DIAGNOSIS — M25572 Pain in left ankle and joints of left foot: Secondary | ICD-10-CM | POA: Diagnosis not present

## 2021-07-12 NOTE — Progress Notes (Signed)
Chief Complaint:   OBESITY Katelyn Walker is here to discuss her progress with her obesity treatment plan along with follow-up of her obesity related diagnoses.   Today's visit was #: 52 Starting weight: 242 lbs Starting date: 05/14/2018 Today's weight: 242 lbs Today's date: 07/04/2021 Weight change since last visit: 0 Total lbs lost to date: 0 Body mass index is 37.9 kg/m.   Interim History:  Katelyn Walker says she enjoyed the trip with her daughter for the skating competition.  She is happy that she maintained.  She endorses polyphagia.  Current Meal Plan: keeping a food journal and adhering to recommended goals of 1200 calories and 90 grams of protein for 75% of the time.  Current Exercise Plan: Swimming for 45 minutes 2 times per week. Current Anti-Obesity Medications: phentermine 15 mg twice daily. Side effects: None.  Assessment/Plan:   Meds ordered this encounter  Medications   buPROPion (WELLBUTRIN XL) 150 MG 24 hr tablet    Sig: Take 1 tablet (150 mg total) by mouth daily.    Dispense:  30 tablet    Refill:  0   tirzepatide (MOUNJARO) 2.5 MG/0.5ML Pen    Sig: Inject 2.5 mg into the skin once a week.    Dispense:  2 mL    Refill:  0    1. Impaired fasting glucose Will start Mounjaro 2.5 mg subcutaneously weekly.  - Start tirzepatide [facility] 2.5 MG/0.5ML Pen; Inject 2.5 mg into the skin once a week.  Dispense: 2 mL; Refill: 0  2. Other iron deficiency anemia We reviewed Katelyn Walker's recent visit with Hematology.  Nutrition: Iron-rich foods include dark leafy greens, red and white meats, eggs, seafood, and beans.  Certain foods and drinks prevent your body from absorbing iron properly. Avoid eating these foods in the same meal as iron-rich foods or with iron supplements. These foods include: coffee, black tea, and red wine; milk, dairy products, and foods that are high in calcium; beans and soybeans; whole grains. Constipation can be a side effect of iron supplementation.  Increased water and fiber intake are helpful. Water goal: > 2 liters/day. Fiber goal: > 25 grams/day.  CBC Latest Ref Rng & Units 06/13/2021 05/11/2021 01/30/2021  WBC 4.0 - 10.5 K/uL 9.8 9.7 9.2  Hemoglobin 12.0 - 15.0 g/dL 47.8 29.5 62.1  Hematocrit 36.0 - 46.0 % 42.2 41.8 38.3  Platelets 150 - 400 K/uL 291 309 352   Lab Results  Component Value Date   IRON 72 06/13/2021   TIBC 286 06/13/2021   FERRITIN 178 06/13/2021   Lab Results  Component Value Date   VITAMINB12 309 05/11/2021   3. Other depression, with emotional eating Controlled. Medication: Wellbutrin XL 150 mg daily.  Plan:  Refill Wellbutrin.  Discussed cues and consequences, how thoughts affect eating, model of thoughts, feelings, and behaviors, and strategies for change by focusing on the cue. Discussed cognitive distortions, coping thoughts, and how to change your thoughts.  - Refill buPROPion (WELLBUTRIN XL) 150 MG 24 hr tablet; Take 1 tablet (150 mg total) by mouth daily.  Dispense: 30 tablet; Refill: 0  4. At risk for nausea Katelyn Walker is at risk for nausea due to starting [facility]. We reviewed ways to decrease side effect of nausea through eating small meals, avoiding high sugar or fat foods, and ultimately going back on the dose if not tolerating. Time > 9 minutes.  5. Obesity, current BMI 37.9  Course: Katelyn Walker is currently in the action stage of change. As such, her  goal is to continue with weight loss efforts.   Nutrition goals: She has agreed to keeping a food journal and adhering to recommended goals of 1200 calories and 90 grams of protein.   Exercise goals:  As is.  Behavioral modification strategies: increasing lean protein intake, decreasing simple carbohydrates, increasing vegetables, and increasing water intake.  Katelyn Walker has agreed to follow-up with our clinic in 4 weeks. She was informed of the importance of frequent follow-up visits to maximize her success with intensive lifestyle modifications for her  multiple health conditions.   Objective:   Blood pressure 126/88, pulse 75, temperature 98 F (36.7 C), temperature source Oral, height 5\' 7"  (1.702 m), weight 242 lb (109.8 kg), SpO2 96 %. Body mass index is 37.9 kg/m.  General: Cooperative, alert, well developed, in no acute distress. HEENT: Conjunctivae and lids unremarkable. Cardiovascular: Regular rhythm.  Lungs: Normal work of breathing. Neurologic: No focal deficits.   Lab Results  Component Value Date   CREATININE 0.85 01/30/2021   BUN 12 01/30/2021   NA 138 01/30/2021   K 4.5 01/30/2021   CL 100 01/30/2021   CO2 23 01/30/2021   Lab Results  Component Value Date   ALT 8 01/30/2021   AST 13 01/30/2021   ALKPHOS 90 01/30/2021   BILITOT <0.2 01/30/2021   Lab Results  Component Value Date   HGBA1C 5.6 01/30/2021   HGBA1C 5.7 (H) 06/29/2020   HGBA1C 5.6 01/04/2020   HGBA1C 5.6 09/14/2019   HGBA1C 5.8 (H) 03/19/2019   Lab Results  Component Value Date   INSULIN 23.7 01/30/2021   INSULIN 21.6 01/04/2020   INSULIN 14.9 09/14/2019   INSULIN 17.7 03/19/2019   INSULIN 17.4 10/13/2018   Lab Results  Component Value Date   TSH 4.080 01/04/2020   Lab Results  Component Value Date   CHOL 188 01/30/2021   HDL 50 01/30/2021   LDLCALC 113 (H) 01/30/2021   TRIG 142 01/30/2021   CHOLHDL 3.8 01/30/2021   Lab Results  Component Value Date   VD25OH 47.0 01/30/2021   VD25OH 45.5 06/29/2020   VD25OH 44.5 01/04/2020   Lab Results  Component Value Date   WBC 9.8 06/13/2021   HGB 13.9 06/13/2021   HCT 42.2 06/13/2021   MCV 81.6 06/13/2021   PLT 291 06/13/2021   Lab Results  Component Value Date   IRON 72 06/13/2021   TIBC 286 06/13/2021   FERRITIN 178 06/13/2021   Attestation Statements:   Reviewed by clinician on day of visit: allergies, medications, problem list, medical history, surgical history, family history, social history, and previous encounter notes.  I, 06/15/2021, CMA, am acting as  transcriptionist for Insurance claims handler, DO  I have reviewed the above documentation for accuracy and completeness, and I agree with the above. Helane Rima, DO

## 2021-07-18 DIAGNOSIS — S82842A Displaced bimalleolar fracture of left lower leg, initial encounter for closed fracture: Secondary | ICD-10-CM | POA: Diagnosis not present

## 2021-07-18 DIAGNOSIS — G8918 Other acute postprocedural pain: Secondary | ICD-10-CM | POA: Diagnosis not present

## 2021-07-18 DIAGNOSIS — W19XXXA Unspecified fall, initial encounter: Secondary | ICD-10-CM | POA: Diagnosis not present

## 2021-07-18 DIAGNOSIS — S93422A Sprain of deltoid ligament of left ankle, initial encounter: Secondary | ICD-10-CM | POA: Diagnosis not present

## 2021-07-18 DIAGNOSIS — X58XXXA Exposure to other specified factors, initial encounter: Secondary | ICD-10-CM | POA: Diagnosis not present

## 2021-07-18 DIAGNOSIS — S93432A Sprain of tibiofibular ligament of left ankle, initial encounter: Secondary | ICD-10-CM | POA: Diagnosis not present

## 2021-07-18 HISTORY — PX: ANKLE FRACTURE SURGERY: SHX122

## 2021-07-31 ENCOUNTER — Encounter: Payer: Self-pay | Admitting: Hematology and Oncology

## 2021-07-31 DIAGNOSIS — S82842A Displaced bimalleolar fracture of left lower leg, initial encounter for closed fracture: Secondary | ICD-10-CM | POA: Diagnosis not present

## 2021-08-01 ENCOUNTER — Encounter (INDEPENDENT_AMBULATORY_CARE_PROVIDER_SITE_OTHER): Payer: Self-pay | Admitting: Family Medicine

## 2021-08-07 ENCOUNTER — Encounter (INDEPENDENT_AMBULATORY_CARE_PROVIDER_SITE_OTHER): Payer: Self-pay | Admitting: Family Medicine

## 2021-08-07 ENCOUNTER — Telehealth (INDEPENDENT_AMBULATORY_CARE_PROVIDER_SITE_OTHER): Payer: BC Managed Care – PPO | Admitting: Family Medicine

## 2021-08-07 DIAGNOSIS — M79605 Pain in left leg: Secondary | ICD-10-CM | POA: Diagnosis not present

## 2021-08-07 DIAGNOSIS — R7301 Impaired fasting glucose: Secondary | ICD-10-CM

## 2021-08-07 DIAGNOSIS — Z6837 Body mass index (BMI) 37.0-37.9, adult: Secondary | ICD-10-CM

## 2021-08-07 MED ORDER — TIRZEPATIDE 5 MG/0.5ML ~~LOC~~ SOAJ: 5.0000 mg | SUBCUTANEOUS | 0 refills | Status: DC

## 2021-08-08 ENCOUNTER — Encounter (INDEPENDENT_AMBULATORY_CARE_PROVIDER_SITE_OTHER): Payer: Self-pay

## 2021-08-09 ENCOUNTER — Telehealth (INDEPENDENT_AMBULATORY_CARE_PROVIDER_SITE_OTHER): Payer: Self-pay

## 2021-08-09 NOTE — Progress Notes (Signed)
TeleHealth Visit:  Due to the COVID-19 pandemic, this visit was completed with telemedicine (audio/video) technology to reduce patient and provider exposure as well as to preserve personal protective equipment.   Katelyn Walker has verbally consented to this TeleHealth visit. The patient is located at home, the provider is located at the [facility] office. The participants in this visit include the listed provider and patient. The visit was conducted today via MyChart video.  Chief Complaint: OBESITY Katelyn Walker is here to discuss her progress with her obesity treatment plan along with follow-up of her obesity related diagnoses. Katelyn Walker is on keeping a food journal and adhering to recommended goals of 1200 calories and 90 grams of protein and states she is following her eating plan approximately 95% of the time. Katelyn Walker states she is not exercising at this time.  Today's visit was #: 53 Starting weight: 242 lbs Starting date: 05/14/2018  Interim History: Dillyn fractured her left fibula last month and is status post surgery.  She is now in a cast.  She is healing as expected.  She says that Katelyn Walker helps with her polyphagia.  Subjective:   1. Impaired fasting glucose, with polyphagia Katelyn Walker is currently taking Mounjaro 2.5 mg subcutaneously weekly and says that it is helping with her polyphagia.  Plan:  Increase Mounjaro to 5 mg subcutaneously weekly.  2. Left leg pain Followed by [facility].  Assessment/Plan:   1. Impaired fasting glucose, with polyphagia Katelyn Walker is currently taking Mounjaro 2.5 mg subcutaneously weekly and says that it is helping with her polyphagia.  Plan:  Increase Mounjaro to 5 mg subcutaneously weekly.  - Increase and refill tirzepatide (MOUNJARO) 5 MG/0.5ML Pen; Inject 5 mg into the skin once a week.  Dispense: 6 mL; Refill: 0  2. Left leg pain Followed by [facility].  3. Obesity, current BMI 37.9  Katelyn Walker is currently in  the action stage of change. As such, her goal is to continue with weight loss efforts. She has agreed to keeping a food journal and adhering to recommended goals of 1200 calories and 90 grams of protein.   Exercise goals:  As is.  Behavioral modification strategies: increasing lean protein intake, decreasing simple carbohydrates, increasing vegetables, and increasing water intake.  Katelyn Walker has agreed to follow-up with our clinic in 4 weeks. She was informed of the importance of frequent follow-up visits to maximize her success with intensive lifestyle modifications for her multiple health conditions.  Objective:   VITALS: Per patient if applicable, see vitals. GENERAL: Alert and in no acute distress. CARDIOPULMONARY: No increased WOB. Speaking in clear sentences.  PSYCH: Pleasant and cooperative. Speech normal rate and rhythm. Affect is appropriate. Insight and judgement are appropriate. Attention is focused, linear, and appropriate.  NEURO: Oriented as arrived to appointment on time with no prompting.   Lab Results  Component Value Date   CREATININE 0.85 01/30/2021   BUN 12 01/30/2021   NA 138 01/30/2021   K 4.5 01/30/2021   CL 100 01/30/2021   CO2 23 01/30/2021   Lab Results  Component Value Date   ALT 8 01/30/2021   AST 13 01/30/2021   ALKPHOS 90 01/30/2021   BILITOT <0.2 01/30/2021   Lab Results  Component Value Date   HGBA1C 5.6 01/30/2021   HGBA1C 5.7 (H) 06/29/2020   HGBA1C 5.6 01/04/2020   HGBA1C 5.6 09/14/2019   HGBA1C 5.8 (H) 03/19/2019   Lab Results  Component Value Date   INSULIN 23.7 01/30/2021   INSULIN  21.6 01/04/2020   INSULIN 14.9 09/14/2019   INSULIN 17.7 03/19/2019   INSULIN 17.4 10/13/2018   Lab Results  Component Value Date   TSH 4.080 01/04/2020   Lab Results  Component Value Date   CHOL 188 01/30/2021   HDL 50 01/30/2021   LDLCALC 113 (H) 01/30/2021   TRIG 142 01/30/2021   CHOLHDL 3.8 01/30/2021   Lab Results  Component Value Date    VD25OH 47.0 01/30/2021   VD25OH 45.5 06/29/2020   VD25OH 44.5 01/04/2020   Lab Results  Component Value Date   WBC 9.8 06/13/2021   HGB 13.9 06/13/2021   HCT 42.2 06/13/2021   MCV 81.6 06/13/2021   PLT 291 06/13/2021   Lab Results  Component Value Date   IRON 72 06/13/2021   TIBC 286 06/13/2021   FERRITIN 178 06/13/2021   Attestation Statements:   Reviewed by clinician on day of visit: allergies, medications, problem list, medical history, surgical history, family history, social history, and previous encounter notes.  I, Insurance claims handler, CMA, am acting as transcriptionist for Helane Rima, DO  I have reviewed the above documentation for accuracy and completeness, and I agree with the above. Helane Rima, DO

## 2021-08-09 NOTE — Telephone Encounter (Signed)
[employer] is calling in the check the status of a PA for the medicine Mounjaro. [phone]

## 2021-08-28 ENCOUNTER — Encounter: Payer: Self-pay | Admitting: Hematology and Oncology

## 2021-08-28 DIAGNOSIS — S82842A Displaced bimalleolar fracture of left lower leg, initial encounter for closed fracture: Secondary | ICD-10-CM | POA: Diagnosis not present

## 2021-08-29 ENCOUNTER — Encounter (INDEPENDENT_AMBULATORY_CARE_PROVIDER_SITE_OTHER): Payer: Self-pay | Admitting: Family Medicine

## 2021-08-29 DIAGNOSIS — M25672 Stiffness of left ankle, not elsewhere classified: Secondary | ICD-10-CM | POA: Diagnosis not present

## 2021-08-29 DIAGNOSIS — M6281 Muscle weakness (generalized): Secondary | ICD-10-CM | POA: Diagnosis not present

## 2021-08-29 DIAGNOSIS — R262 Difficulty in walking, not elsewhere classified: Secondary | ICD-10-CM | POA: Diagnosis not present

## 2021-09-01 DIAGNOSIS — M6281 Muscle weakness (generalized): Secondary | ICD-10-CM | POA: Diagnosis not present

## 2021-09-01 DIAGNOSIS — R262 Difficulty in walking, not elsewhere classified: Secondary | ICD-10-CM | POA: Diagnosis not present

## 2021-09-01 DIAGNOSIS — M25672 Stiffness of left ankle, not elsewhere classified: Secondary | ICD-10-CM | POA: Diagnosis not present

## 2021-09-04 ENCOUNTER — Encounter (INDEPENDENT_AMBULATORY_CARE_PROVIDER_SITE_OTHER): Payer: Self-pay | Admitting: Family Medicine

## 2021-09-04 ENCOUNTER — Telehealth (INDEPENDENT_AMBULATORY_CARE_PROVIDER_SITE_OTHER): Payer: BC Managed Care – PPO | Admitting: Family Medicine

## 2021-09-04 DIAGNOSIS — Z8781 Personal history of (healed) traumatic fracture: Secondary | ICD-10-CM | POA: Diagnosis not present

## 2021-09-04 DIAGNOSIS — R7301 Impaired fasting glucose: Secondary | ICD-10-CM

## 2021-09-04 DIAGNOSIS — F3289 Other specified depressive episodes: Secondary | ICD-10-CM | POA: Diagnosis not present

## 2021-09-04 DIAGNOSIS — Z6837 Body mass index (BMI) 37.0-37.9, adult: Secondary | ICD-10-CM

## 2021-09-04 MED ORDER — TIRZEPATIDE 5 MG/0.5ML ~~LOC~~ SOAJ
5.0000 mg | SUBCUTANEOUS | 0 refills | Status: DC
Start: 2021-09-04 — End: 2021-10-02

## 2021-09-04 MED ORDER — BUPROPION HCL ER (XL) 150 MG PO TB24: 150.0000 mg | ORAL_TABLET | Freq: Every day | ORAL | 0 refills | Status: DC

## 2021-09-04 NOTE — Progress Notes (Signed)
TeleHealth Visit:  Due to the COVID-19 pandemic, this visit was completed with telemedicine (audio/video) technology to reduce patient and provider exposure as well as to preserve personal protective equipment.   Katelyn Walker has verbally consented to this TeleHealth visit. The patient is located at home, the provider is located at the [facility] [facility] office. The participants in this visit include the listed provider and patient. The visit was conducted today via MyChart video.  Chief Complaint: OBESITY Katelyn Walker is here to discuss her progress with her obesity treatment plan along with follow-up of her obesity related diagnoses. Katelyn Walker is on keeping a food journal and adhering to recommended goals of 1200 calories and 90 grams of protein and states she is following her eating plan approximately 90% of the time. Katelyn Walker states she is doing PT for 60 minutes 2 times per week.  Today's visit was #: 54 Starting weight: 242 lbs Starting date: 05/14/2018  Interim History: Katelyn Walker is doing well.  She is weight bearing 25%.  Assessment/Plan:   1. Impaired fasting glucose, with polyphagia Controlled. Current treatment: Mounjaro 5 mg subcutaneously weekly. She will continue to focus on protein-rich, low simple carbohydrate foods. We reviewed the importance of hydration, regular exercise for stress reduction, and restorative sleep.  Plan:  Refill Mounjaro 5 mg subcutaneously weekly, as per below.  - Refill tirzepatide (MOUNJARO) 5 MG/0.5ML Pen; Inject 5 mg into the skin once a week.  Dispense: 6 mL; Refill: 0  2. History of fracture of ankle, healing Continue PT twice weekly for 60 minutes.  3. Other depression, with emotional eating At goal. Medication: Wellbutrin XL 150 mg daily.  Plan:  Refill Wellbutrin.  Discussed cues and consequences, how thoughts affect eating, model of thoughts, feelings, and behaviors, and strategies for change by focusing on the cue. Discussed cognitive  distortions, coping thoughts, and how to change your thoughts.  - Refill buPROPion (WELLBUTRIN XL) 150 MG 24 hr tablet; Take 1 tablet (150 mg total) by mouth daily.  Dispense: 90 tablet; Refill: 0  4. Obesity, current BMI 37.9  Katelyn Walker is currently in the action stage of change. As such, her goal is to continue with weight loss efforts. She has agreed to keeping a food journal and adhering to recommended goals of 1200 calories and 90 grams of protein.   Exercise goals:  As is.  Behavioral modification strategies: increasing lean protein intake, decreasing simple carbohydrates, increasing vegetables, and increasing water intake.  Katelyn Walker has agreed to follow-up with our clinic in 4 weeks. She was informed of the importance of frequent follow-up visits to maximize her success with intensive lifestyle modifications for her multiple health conditions.  Objective:   VITALS: Per patient if applicable, see vitals. GENERAL: Alert and in no acute distress. CARDIOPULMONARY: No increased WOB. Speaking in clear sentences.  PSYCH: Pleasant and cooperative. Speech normal rate and rhythm. Affect is appropriate. Insight and judgement are appropriate. Attention is focused, linear, and appropriate.  NEURO: Oriented as arrived to appointment on time with no prompting.   Lab Results  Component Value Date   CREATININE 0.85 01/30/2021   BUN 12 01/30/2021   NA 138 01/30/2021   K 4.5 01/30/2021   CL 100 01/30/2021   CO2 23 01/30/2021   Lab Results  Component Value Date   ALT 8 01/30/2021   AST 13 01/30/2021   ALKPHOS 90 01/30/2021   BILITOT <0.2 01/30/2021   Lab Results  Component Value Date   HGBA1C 5.6 01/30/2021   HGBA1C  5.7 (H) 06/29/2020   HGBA1C 5.6 01/04/2020   HGBA1C 5.6 09/14/2019   HGBA1C 5.8 (H) 03/19/2019   Lab Results  Component Value Date   INSULIN 23.7 01/30/2021   INSULIN 21.6 01/04/2020   INSULIN 14.9 09/14/2019   INSULIN 17.7 03/19/2019   INSULIN 17.4 10/13/2018   Lab  Results  Component Value Date   TSH 4.080 01/04/2020   Lab Results  Component Value Date   CHOL 188 01/30/2021   HDL 50 01/30/2021   LDLCALC 113 (H) 01/30/2021   TRIG 142 01/30/2021   CHOLHDL 3.8 01/30/2021   Lab Results  Component Value Date   VD25OH 47.0 01/30/2021   VD25OH 45.5 06/29/2020   VD25OH 44.5 01/04/2020   Lab Results  Component Value Date   WBC 9.8 06/13/2021   HGB 13.9 06/13/2021   HCT 42.2 06/13/2021   MCV 81.6 06/13/2021   PLT 291 06/13/2021   Lab Results  Component Value Date   IRON 72 06/13/2021   TIBC 286 06/13/2021   FERRITIN 178 06/13/2021   Attestation Statements:   Reviewed by clinician on day of visit: allergies, medications, problem list, medical history, surgical history, family history, social history, and previous encounter notes.  I, Insurance claims handler, CMA, am acting as transcriptionist for Helane Rima, DO  I have reviewed the above documentation for accuracy and completeness, and I agree with the above. Helane Rima, DO

## 2021-09-05 DIAGNOSIS — M6281 Muscle weakness (generalized): Secondary | ICD-10-CM | POA: Diagnosis not present

## 2021-09-05 DIAGNOSIS — M25672 Stiffness of left ankle, not elsewhere classified: Secondary | ICD-10-CM | POA: Diagnosis not present

## 2021-09-05 DIAGNOSIS — R262 Difficulty in walking, not elsewhere classified: Secondary | ICD-10-CM | POA: Diagnosis not present

## 2021-09-07 DIAGNOSIS — M6281 Muscle weakness (generalized): Secondary | ICD-10-CM | POA: Diagnosis not present

## 2021-09-07 DIAGNOSIS — M25672 Stiffness of left ankle, not elsewhere classified: Secondary | ICD-10-CM | POA: Diagnosis not present

## 2021-09-07 DIAGNOSIS — R262 Difficulty in walking, not elsewhere classified: Secondary | ICD-10-CM | POA: Diagnosis not present

## 2021-09-12 DIAGNOSIS — R262 Difficulty in walking, not elsewhere classified: Secondary | ICD-10-CM | POA: Diagnosis not present

## 2021-09-12 DIAGNOSIS — M6281 Muscle weakness (generalized): Secondary | ICD-10-CM | POA: Diagnosis not present

## 2021-09-12 DIAGNOSIS — M25672 Stiffness of left ankle, not elsewhere classified: Secondary | ICD-10-CM | POA: Diagnosis not present

## 2021-09-13 ENCOUNTER — Encounter: Payer: Self-pay | Admitting: Hematology and Oncology

## 2021-09-14 ENCOUNTER — Encounter: Payer: Self-pay | Admitting: Hematology and Oncology

## 2021-09-14 DIAGNOSIS — M6281 Muscle weakness (generalized): Secondary | ICD-10-CM | POA: Diagnosis not present

## 2021-09-14 DIAGNOSIS — R262 Difficulty in walking, not elsewhere classified: Secondary | ICD-10-CM | POA: Diagnosis not present

## 2021-09-14 DIAGNOSIS — M25672 Stiffness of left ankle, not elsewhere classified: Secondary | ICD-10-CM | POA: Diagnosis not present

## 2021-09-20 DIAGNOSIS — R262 Difficulty in walking, not elsewhere classified: Secondary | ICD-10-CM | POA: Diagnosis not present

## 2021-09-20 DIAGNOSIS — M25672 Stiffness of left ankle, not elsewhere classified: Secondary | ICD-10-CM | POA: Diagnosis not present

## 2021-09-20 DIAGNOSIS — M6281 Muscle weakness (generalized): Secondary | ICD-10-CM | POA: Diagnosis not present

## 2021-09-21 ENCOUNTER — Ambulatory Visit (AMBULATORY_SURGERY_CENTER): Payer: BC Managed Care – PPO | Admitting: Gastroenterology

## 2021-09-21 ENCOUNTER — Encounter: Payer: BC Managed Care – PPO | Admitting: Gastroenterology

## 2021-09-21 ENCOUNTER — Encounter: Payer: Self-pay | Admitting: Gastroenterology

## 2021-09-21 ENCOUNTER — Other Ambulatory Visit: Payer: Self-pay

## 2021-09-21 VITALS — BP 148/87 | HR 72 | Temp 97.1°F | Resp 21 | Ht 67.0 in | Wt 245.0 lb

## 2021-09-21 DIAGNOSIS — R12 Heartburn: Secondary | ICD-10-CM | POA: Diagnosis not present

## 2021-09-21 DIAGNOSIS — K449 Diaphragmatic hernia without obstruction or gangrene: Secondary | ICD-10-CM

## 2021-09-21 DIAGNOSIS — K2970 Gastritis, unspecified, without bleeding: Secondary | ICD-10-CM | POA: Diagnosis not present

## 2021-09-21 DIAGNOSIS — D509 Iron deficiency anemia, unspecified: Secondary | ICD-10-CM

## 2021-09-21 DIAGNOSIS — R1310 Dysphagia, unspecified: Secondary | ICD-10-CM

## 2021-09-21 DIAGNOSIS — K222 Esophageal obstruction: Secondary | ICD-10-CM

## 2021-09-21 DIAGNOSIS — K2950 Unspecified chronic gastritis without bleeding: Secondary | ICD-10-CM | POA: Diagnosis not present

## 2021-09-21 DIAGNOSIS — K317 Polyp of stomach and duodenum: Secondary | ICD-10-CM

## 2021-09-21 DIAGNOSIS — K219 Gastro-esophageal reflux disease without esophagitis: Secondary | ICD-10-CM

## 2021-09-21 MED ORDER — SODIUM CHLORIDE 0.9 % IV SOLN: 500.0000 mL | Freq: Once | INTRAVENOUS | Status: DC

## 2021-09-21 NOTE — Op Note (Signed)
[facility] Patient Name: Katelyn Walker Procedure Date: 09/21/2021 11:00 AM MRN: [id number] Endoscopist: Doristine Locks , MD Age: 50 Referring MD:  Date of Birth: May 15, 1971 Gender: Female Account #: [id number] Procedure:                Upper GI endoscopy Indications:              Dysphagia, Heartburn, Suspected esophageal reflux Medicines:                Monitored Anesthesia Care Procedure:                Pre-Anesthesia Assessment:                           - Prior to the procedure, a History and Physical                            was performed, and patient medications and                            allergies were reviewed. The patient's tolerance of                            previous anesthesia was also reviewed. The risks                            and benefits of the procedure and the sedation                            options and risks were discussed with the patient.                            All questions were answered, and informed consent                            was obtained. Prior Anticoagulants: The patient has                            taken no previous anticoagulant or antiplatelet                            agents. ASA Grade Assessment: II - A patient with                            mild systemic disease. After reviewing the risks                            and benefits, the patient was deemed in                            satisfactory condition to undergo the procedure.                           After obtaining informed consent, the endoscope was  passed under direct vision. Throughout the                            procedure, the patient's blood pressure, pulse, and                            oxygen saturations were monitored continuously. The                            Endoscope was introduced through the mouth, and                            advanced to the second part of duodenum. The upper                             GI endoscopy was accomplished without difficulty.                            The patient tolerated the procedure well. Scope In: Scope Out: Findings:                 A 5 cm hiatal hernia was present.                           The Z-line was regular and was found 34 cm from the                            incisors.                           A mild Schatzki ring was found in the lower third                            of the esophagus. A TTS dilator was passed through                            the scope. Dilation with an 18-19-20 mm balloon                            dilator was performed to 20 mm. The dilation site                            was examined and showed no bleeding, mucosal tear                            or perforation. This was biopsied with a cold                            forceps for fracturing of the ring. Estimated blood                            loss was minimal.  A few small sessile polyps with no bleeding and no                            stigmata of recent bleeding were found in the                            gastric fundus and in the gastric body. These                            polyps were removed with a cold biopsy forceps.                            Resection and retrieval were complete. Estimated                            blood loss was minimal.                           Normal mucosa was found in the stomach. Biopsies                            were taken with a cold forceps for Helicobacter                            pylori testing. Estimated blood loss was minimal.                           The examined duodenum was normal. Biopsies were                            taken with a cold forceps for histology. Estimated                            blood loss was minimal. Complications:            No immediate complications. Estimated Blood Loss:     Estimated blood loss was minimal. Impression:               - 5 cm hiatal hernia.                            - Z-line regular, 34 cm from the incisors.                           - Mild Schatzki ring. Dilated. Biopsied.                           - A few gastric polyps. Resected and retrieved.                           - Normal mucosa was found in the stomach. Biopsied.                           - Normal examined duodenum. Biopsied. Recommendation:           -  Patient has a contact number available for                            emergencies. The signs and symptoms of potential                            delayed complications were discussed with the                            patient. Return to normal activities tomorrow.                            Written discharge instructions were provided to the                            patient.                           - Resume previous diet.                           - Continue present medications.                           - Await pathology results.                           - Repeat upper endoscopy PRN for retreatment.                           - Return to GI clinic PRN. Doristine Locks, MD 09/21/2021 11:35:11 AM

## 2021-09-21 NOTE — Patient Instructions (Signed)
Await pathology results from the biopsies done today.  Resume previous diet and medications.  Information on hiatal hernia given to you today.   YOU HAD AN ENDOSCOPIC PROCEDURE TODAY AT THE [facility]:   Refer to the procedure report that was given to you for any specific questions about what was found during the examination.  If the procedure report does not answer your questions, please call your gastroenterologist to clarify.  If you requested that your care partner not be given the details of your procedure findings, then the procedure report has been included in a sealed envelope for you to review at your convenience later.  YOU SHOULD EXPECT: Some feelings of bloating in the abdomen. Passage of more gas than usual.  Walking can help get rid of the air that was put into your GI tract during the procedure and reduce the bloating. If you had a lower endoscopy (such as a colonoscopy or flexible sigmoidoscopy) you may notice spotting of blood in your stool or on the toilet paper. If you underwent a bowel prep for your procedure, you may not have a normal bowel movement for a few days.  Please Note:  You might notice some irritation and congestion in your nose or some drainage.  This is from the oxygen used during your procedure.  There is no need for concern and it should clear up in a day or so.  SYMPTOMS TO REPORT IMMEDIATELY:  Following upper endoscopy (EGD)  Vomiting of blood or coffee ground material  New chest pain or pain under the shoulder blades  Painful or persistently difficult swallowing  New shortness of breath  Fever of 100F or higher  Black, tarry-looking stools  For urgent or emergent issues, a gastroenterologist can be reached at any hour by calling [phone]. Do not use MyChart messaging for urgent concerns.    DIET:  We do recommend a small meal at first, but then you may proceed to your regular diet.  Drink plenty of fluids but you should avoid  alcoholic beverages for 24 hours.  ACTIVITY:  You should plan to take it easy for the rest of today and you should NOT DRIVE or use heavy machinery until tomorrow (because of the sedation medicines used during the test).    FOLLOW UP: Our staff will call the number listed on your records 48-72 hours following your procedure to check on you and address any questions or concerns that you may have regarding the information given to you following your procedure. If we do not reach you, we will leave a message.  We will attempt to reach you two times.  During this call, we will ask if you have developed any symptoms of COVID 19. If you develop any symptoms (ie: fever, flu-like symptoms, shortness of breath, cough etc.) before then, please call [phone].  If you test positive for Covid 19 in the 2 weeks post procedure, please call and report this information to [name].    If any biopsies were taken you will be contacted by phone or by letter within the next 1-3 weeks.  Please call us at [phone] if you have not heard about the biopsies in 3 weeks.    SIGNATURES/CONFIDENTIALITY: You and/or your care partner have signed paperwork which will be entered into your electronic medical record.  These signatures attest to the fact that that the information above on your After Visit Summary has been reviewed and is understood.  Full responsibility of the confidentiality  of this discharge information lies with you and/or your care-partner.  

## 2021-09-21 NOTE — Progress Notes (Signed)
GASTROENTEROLOGY PROCEDURE H&P NOTE   Primary Care Physician: Laurann Montana, MD    Reason for Procedure:   GERD, Hiatal hernia, dysphagia, IDA  Plan:    EGD  Patient is appropriate for endoscopic procedure(s) in the ambulatory ([facility]) setting.  The nature of the procedure, as well as the risks, benefits, and alternatives were carefully and thoroughly reviewed with the patient. Ample time for discussion and questions allowed. The patient understood, was satisfied, and agreed to proceed.     HPI: Katelyn Walker is a 50 y.o. female who presents for EGD for evaluation of GERD, dysphagia, and history of IDA.  - Reflux started in 2008 during pregnancy.  Index symptoms of heartburn, regurgitation - 2010: Developed intermittent solid food dysphagia.  Was seen by Dr. Randa Evens at [facility].  Started omeprazole 20 mg/day with overall improvement - Esophagram 09/2009: Small HH, mild ring at GE junction - 2015: Omeprazole changed to Zantac due to PPI concerns, with immediate breakthrough and eventually changed back to PPI. - EGD (12/2015, Dr. Kinnie Scales): 6 cm hiatal hernia, esophageal stricture dilated with 17 mm Savary.  Inflammatory changes on esophageal stricture biopsies.  LA Grade D esophagitis.  Started omeprazole 40 mg/day - EGD 02/2016 (Dr. Kinnie Scales): Resolved erosive esophagitis, large 5 cm hiatal hernia, nonobstructing distal esophageal stricture.  Continued omeprazole 40 mg/day with relief of reflux sxs then tapered to 20 mg/day in 2018, but breakthrough at 20 mg/day -Colonoscopy 08/2018.  Internal hemorrhoids, diverticulosis, sigmoid polyp (benign path), rectal polyp x2 (tubular adenomas).  5-year repeat per patient - Hemorrhoid banding recommended in 2019 by Dr. Kinnie Scales, but postponed d/t Covid concerns -05/23/2021: Initial appt in [facility] for evaluation of solid food dysphagia, increasing reflux sxs (throat clearing, dry cough, nausea).   For her reflux, takes omeprazole 40 mg/day with Tums  prn breakthrough.   Maternal uncle with Esophageal CA. Father with colon polyps. No known family history of CRC, liver disease, pancreatic disease, or IBD.   Past Medical History:  Diagnosis Date   Back pain    Dyspnea    Elevated blood pressure reading without diagnosis of hypertension    Esophageal ulcer    GERD (gastroesophageal reflux disease)    Hot flashes    Hypothyroid    Joint pain    Sciatica, left side    Vitamin B 12 deficiency    Vitamin D deficiency     Past Surgical History:  Procedure Laterality Date   ANKLE FRACTURE SURGERY Left 07/18/2021   DILATION AND CURETTAGE OF UTERUS     2011    OVARIAN CYST SURGERY     2009, 2013    Prior to Admission medications   Medication Sig Start Date End Date Taking? Authorizing Provider  buPROPion (WELLBUTRIN XL) 150 MG 24 hr tablet Take 1 tablet (150 mg total) by mouth daily. 09/04/21  Yes Helane Rima, DO  Norgestimate-Eth Estradiol (ESTARYLLA PO) Take 28 mg by mouth.   Yes [provider]  omeprazole (PRILOSEC) 40 MG capsule Take 1 capsule (40 mg total) by mouth daily. 07/06/21 09/21/21 Yes Lavone Barrientes V, DO  tirzepatide [facility]) 5 MG/0.5ML Pen Inject 5 mg into the skin once a week. 09/04/21  Yes Helane Rima, DO  Vitamin D, Ergocalciferol, (DRISDOL) 1.25 MG (50000 UNIT) CAPS capsule Take 1 capsule (50,000 Units total) by mouth every 7 (seven) days. 06/12/21  Yes Helane Rima, DO  albuterol (VENTOLIN HFA) 108 (90 Base) MCG/ACT inhaler Inhale 2 puffs into the lungs every 6 (six) hours  as needed for wheezing or shortness of breath. 06/14/21   Helane Rima, DO  hydrochlorothiazide (MICROZIDE) 12.5 MG capsule Take 1 capsule (12.5 mg total) by mouth daily. Patient not taking: Reported on 09/21/2021 06/12/21   Helane Rima, DO  phentermine 15 MG capsule 1 in am and 1 at noon. 06/12/21   Helane Rima, DO  traZODone (DESYREL) 50 MG tablet Take 0.5-1 tablets (25-50 mg total) by mouth at bedtime as needed for  sleep. Patient not taking: Reported on 09/21/2021 04/11/21   Helane Rima, DO    Current Outpatient Medications  Medication Sig Dispense Refill   buPROPion (WELLBUTRIN XL) 150 MG 24 hr tablet Take 1 tablet (150 mg total) by mouth daily. 90 tablet 0   Norgestimate-Eth Estradiol (ESTARYLLA PO) Take 28 mg by mouth.     omeprazole (PRILOSEC) 40 MG capsule Take 1 capsule (40 mg total) by mouth daily. 30 capsule 6   tirzepatide (MOUNJARO) 5 MG/0.5ML Pen Inject 5 mg into the skin once a week. 6 mL 0   Vitamin D, Ergocalciferol, (DRISDOL) 1.25 MG (50000 UNIT) CAPS capsule Take 1 capsule (50,000 Units total) by mouth every 7 (seven) days. 4 capsule 0   albuterol (VENTOLIN HFA) 108 (90 Base) MCG/ACT inhaler Inhale 2 puffs into the lungs every 6 (six) hours as needed for wheezing or shortness of breath. 8 g 2   hydrochlorothiazide (MICROZIDE) 12.5 MG capsule Take 1 capsule (12.5 mg total) by mouth daily. (Patient not taking: Reported on 09/21/2021) 30 capsule 0   phentermine 15 MG capsule 1 in am and 1 at noon. 60 capsule 0   traZODone (DESYREL) 50 MG tablet Take 0.5-1 tablets (25-50 mg total) by mouth at bedtime as needed for sleep. (Patient not taking: Reported on 09/21/2021) 90 tablet 0   Current Facility-Administered Medications  Medication Dose Route Frequency Provider Last Rate Last Admin   0.9 %  sodium chloride infusion  500 mL Intravenous Once Jeydan Barner V, DO        Allergies as of 09/21/2021   (No Known Allergies)    Family History  Problem Relation Age of Onset   Alcoholism Mother    Eating disorder Mother    Diabetes Father    Hypertension Father    Sleep apnea Father    Esophageal cancer Maternal Uncle    Colon cancer Neg Hx    Pancreatic cancer Neg Hx    Liver disease Neg Hx    Stomach cancer Neg Hx     Social History   Socioeconomic History   Marital status: Married    Spouse name: Sherrine Maples Crinfield   Number of children: 1   Years of education: Not on file    Highest education level: Not on file  Occupational History   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  Tobacco Use   Smoking status: Never   Smokeless tobacco: Never  Vaping Use   Vaping Use: Never used  Substance and Sexual Activity   Alcohol use: Never   Drug use: Never   Sexual activity: Yes  Other Topics Concern   Not on file  Social History Narrative   Not on file   Social Determinants of Health   Financial Resource Strain: Not on file  Food Insecurity: Not on file  Transportation Needs: Not on file  Physical Activity: Not on file  Stress: Not on file  Social Connections: Not on file  Intimate Partner Violence: Not on file    Physical Exam: Vital signs in  last 24 hours: @BP  (!) 126/96   Pulse 85   Temp (!) 97.1 F (36.2 C) (Skin)   Ht 5\' 7"  (1.702 m)   Wt 245 lb (111.1 kg)   SpO2 99%   BMI 38.37 kg/m  GEN: NAD EYE: Sclerae anicteric ENT: MMM CV: Non-tachycardic Pulm: CTA b/l GI: Soft, NT/ND NEURO:  Alert & Oriented x 3   , DO [facility]   09/21/2021 10:59 AM

## 2021-09-21 NOTE — Progress Notes (Signed)
Pt's states no medical or surgical changes since previsit or office visit. VS assessed by D.T 

## 2021-09-21 NOTE — Progress Notes (Signed)
Called to room to assist during endoscopic procedure.  Patient ID and intended procedure confirmed with present staff. Received instructions for my participation in the procedure from the performing physician.  

## 2021-09-22 ENCOUNTER — Other Ambulatory Visit: Payer: Self-pay

## 2021-09-22 ENCOUNTER — Telehealth: Payer: Self-pay

## 2021-09-22 DIAGNOSIS — K449 Diaphragmatic hernia without obstruction or gangrene: Secondary | ICD-10-CM

## 2021-09-22 DIAGNOSIS — M6281 Muscle weakness (generalized): Secondary | ICD-10-CM | POA: Diagnosis not present

## 2021-09-22 DIAGNOSIS — R262 Difficulty in walking, not elsewhere classified: Secondary | ICD-10-CM | POA: Diagnosis not present

## 2021-09-22 DIAGNOSIS — K219 Gastro-esophageal reflux disease without esophagitis: Secondary | ICD-10-CM

## 2021-09-22 DIAGNOSIS — M25672 Stiffness of left ankle, not elsewhere classified: Secondary | ICD-10-CM | POA: Diagnosis not present

## 2021-09-22 DIAGNOSIS — R1310 Dysphagia, unspecified: Secondary | ICD-10-CM

## 2021-09-22 NOTE — Telephone Encounter (Signed)
-----  Message from [place], DO sent at 09/21/2021  2:29 PM EDT ----- EGD today with 5 cm hiatal hernia. Patient interested in surgical hiatal hernia repair and antireflux surgery. Please coordinate for the following:  - Esophageal Manometry and pH/Impedance testing OFF PPI x7 days - Referral to Dr. Redmond Pulling for consideration of hiatal hernia repair and with possible concomitant TIF - Schedule f/u with me after completion of studies and Wilson eval  She had asked about getting done prior to 12/31 as she has met her deductible. I told her that we would do our best, but that is a very condensed timeline when trying to coordinate a case in the OR. Just FYI, as I am sure she would ask you about that as well.  Thanks!

## 2021-09-22 NOTE — Telephone Encounter (Signed)
Attempted to reach patient, her phone goes straight to vm. Lm on vm for patient to return call.  No available appts for manometry in November or December. Next available is in January. Patient has been scheduled for esophageal manometry/24 hr pH/impedance study for Wednesday, 12/20/20 at 10:30 am. Pt will need to arrive at [facility] at 10 am.  CASE ID: [id number]  Referral, records, demographic and insurance information faxed to [facility].

## 2021-09-25 ENCOUNTER — Telehealth: Payer: Self-pay

## 2021-09-25 ENCOUNTER — Encounter: Payer: Self-pay | Admitting: Gastroenterology

## 2021-09-25 ENCOUNTER — Encounter: Payer: Self-pay | Admitting: Hematology and Oncology

## 2021-09-25 NOTE — Telephone Encounter (Signed)
  Follow up Call-  Call back number 09/21/2021  Post procedure Call Back phone  # [phone]  Permission to leave phone message Yes  Some recent data might be hidden     Patient questions:  Do you have a fever, pain , or abdominal swelling? No. Pain Score  0 *  Have you tolerated food without any problems? Yes.    Have you been able to return to your normal activities? Yes.    Do you have any questions about your discharge instructions: Diet   No. Medications  No. Follow up visit  No.  Do you have questions or concerns about your Care? No.  Actions: * If pain score is 4 or above: No action needed, pain <4.

## 2021-09-25 NOTE — Telephone Encounter (Signed)
Spoke with patient in regards to recommendations below. Pt is aware of manometry appt and referral. Pt has been advised to call us after her consultation with Dr. Andrey Campanile so we can set up her follow up with Dr. Barron Alvine. Pt is aware that it is highly unlikely that we can get everything done before the end of the year but will let her know if anything becomes available. Advised that we do not have a date for Dr. Tawana Scale OR schedule yet either. Pt verbalized understanding and had no concerns at the end of the call.  Esophageal manometry instructions mailed to patient. Ambulatory referral to GI in epic.

## 2021-09-26 DIAGNOSIS — M6281 Muscle weakness (generalized): Secondary | ICD-10-CM | POA: Diagnosis not present

## 2021-09-26 DIAGNOSIS — R262 Difficulty in walking, not elsewhere classified: Secondary | ICD-10-CM | POA: Diagnosis not present

## 2021-09-26 DIAGNOSIS — M25672 Stiffness of left ankle, not elsewhere classified: Secondary | ICD-10-CM | POA: Diagnosis not present

## 2021-09-28 DIAGNOSIS — M25672 Stiffness of left ankle, not elsewhere classified: Secondary | ICD-10-CM | POA: Diagnosis not present

## 2021-09-28 DIAGNOSIS — M6281 Muscle weakness (generalized): Secondary | ICD-10-CM | POA: Diagnosis not present

## 2021-09-28 DIAGNOSIS — R262 Difficulty in walking, not elsewhere classified: Secondary | ICD-10-CM | POA: Diagnosis not present

## 2021-09-29 ENCOUNTER — Encounter: Payer: Self-pay | Admitting: Hematology and Oncology

## 2021-10-02 ENCOUNTER — Encounter (INDEPENDENT_AMBULATORY_CARE_PROVIDER_SITE_OTHER): Payer: Self-pay | Admitting: Family Medicine

## 2021-10-02 ENCOUNTER — Other Ambulatory Visit: Payer: Self-pay

## 2021-10-02 ENCOUNTER — Ambulatory Visit (INDEPENDENT_AMBULATORY_CARE_PROVIDER_SITE_OTHER): Payer: BC Managed Care – PPO | Admitting: Family Medicine

## 2021-10-02 VITALS — BP 148/88 | HR 78 | Temp 97.8°F | Ht 67.0 in | Wt 220.0 lb

## 2021-10-02 DIAGNOSIS — E559 Vitamin D deficiency, unspecified: Secondary | ICD-10-CM

## 2021-10-02 DIAGNOSIS — E611 Iron deficiency: Secondary | ICD-10-CM

## 2021-10-02 DIAGNOSIS — E538 Deficiency of other specified B group vitamins: Secondary | ICD-10-CM

## 2021-10-02 DIAGNOSIS — K449 Diaphragmatic hernia without obstruction or gangrene: Secondary | ICD-10-CM

## 2021-10-02 DIAGNOSIS — E782 Mixed hyperlipidemia: Secondary | ICD-10-CM

## 2021-10-02 DIAGNOSIS — R7301 Impaired fasting glucose: Secondary | ICD-10-CM

## 2021-10-02 DIAGNOSIS — Z6837 Body mass index (BMI) 37.0-37.9, adult: Secondary | ICD-10-CM

## 2021-10-02 DIAGNOSIS — I10 Essential (primary) hypertension: Secondary | ICD-10-CM

## 2021-10-02 DIAGNOSIS — R6889 Other general symptoms and signs: Secondary | ICD-10-CM | POA: Diagnosis not present

## 2021-10-02 MED ORDER — VITAMIN D (ERGOCALCIFEROL) 1.25 MG (50000 UNIT) PO CAPS: 50000.0000 [IU] | ORAL_CAPSULE | ORAL | 0 refills | Status: DC

## 2021-10-02 MED ORDER — TIRZEPATIDE 7.5 MG/0.5ML ~~LOC~~ SOAJ: 7.5000 mg | SUBCUTANEOUS | 0 refills | Status: DC

## 2021-10-02 NOTE — Telephone Encounter (Signed)
Dr.Wallace °

## 2021-10-03 ENCOUNTER — Encounter: Payer: Self-pay | Admitting: Gastroenterology

## 2021-10-03 DIAGNOSIS — R262 Difficulty in walking, not elsewhere classified: Secondary | ICD-10-CM | POA: Diagnosis not present

## 2021-10-03 DIAGNOSIS — M6281 Muscle weakness (generalized): Secondary | ICD-10-CM | POA: Diagnosis not present

## 2021-10-03 DIAGNOSIS — M25672 Stiffness of left ankle, not elsewhere classified: Secondary | ICD-10-CM | POA: Diagnosis not present

## 2021-10-03 LAB — CBC WITH DIFFERENTIAL/PLATELET
Basophils Absolute: 0 10*3/uL (ref 0.0–0.2)
Basos: 0 %
EOS (ABSOLUTE): 0.1 10*3/uL (ref 0.0–0.4)
Eos: 1 %
Hemoglobin: 14.2 g/dL (ref 11.1–15.9)
Immature Grans (Abs): 0 10*3/uL (ref 0.0–0.1)
Immature Granulocytes: 0 %
Lymphocytes Absolute: 1.8 10*3/uL (ref 0.7–3.1)
Lymphs: 26 %
MCH: 27.2 pg (ref 26.6–33.0)
MCHC: 32.4 g/dL (ref 31.5–35.7)
MCV: 84 fL (ref 79–97)
Monocytes Absolute: 0.3 10*3/uL (ref 0.1–0.9)
Monocytes: 5 %
Neutrophils Absolute: 4.8 10*3/uL (ref 1.4–7.0)
Neutrophils: 68 %
Platelets: 272 10*3/uL (ref 150–450)
RBC: 5.23 x10E6/uL (ref 3.77–5.28)
RDW: 13.3 % (ref 11.7–15.4)
WBC: 7 10*3/uL (ref 3.4–10.8)

## 2021-10-03 LAB — COMPREHENSIVE METABOLIC PANEL
ALT: 12 IU/L (ref 0–32)
AST: 13 IU/L (ref 0–40)
Albumin/Globulin Ratio: 1.5 (ref 1.2–2.2)
Albumin: 4.1 g/dL (ref 3.8–4.9)
Alkaline Phosphatase: 96 IU/L (ref 44–121)
BUN/Creatinine Ratio: 8 — ABNORMAL LOW (ref 9–23)
BUN: 7 mg/dL (ref 6–24)
Bilirubin Total: 0.3 mg/dL (ref 0.0–1.2)
CO2: 24 mmol/L (ref 20–29)
Calcium: 8.7 mg/dL (ref 8.7–10.2)
Chloride: 98 mmol/L (ref 96–106)
Creatinine, Ser: 0.92 mg/dL (ref 0.57–1.00)
Globulin, Total: 2.7 g/dL (ref 1.5–4.5)
Glucose: 85 mg/dL (ref 70–99)
Potassium: 3.9 mmol/L (ref 3.5–5.2)
Sodium: 140 mmol/L (ref 134–144)
Total Protein: 6.8 g/dL (ref 6.0–8.5)
eGFR: 75 mL/min/{1.73_m2} (ref >59)

## 2021-10-03 LAB — INSULIN, RANDOM: INSULIN: 17.2 u[IU]/mL (ref 2.6–24.9)

## 2021-10-03 LAB — LIPID PANEL WITH LDL/HDL RATIO
Cholesterol, Total: 203 mg/dL — ABNORMAL HIGH (ref 100–199)
HDL: 51 mg/dL (ref >39)
LDL Chol Calc (NIH): 126 mg/dL — ABNORMAL HIGH (ref 0–99)
LDL/HDL Ratio: 2.5 ratio (ref 0.0–3.2)
Triglycerides: 144 mg/dL (ref 0–149)
VLDL Cholesterol Cal: 26 mg/dL (ref 5–40)

## 2021-10-03 LAB — ANEMIA PANEL
Ferritin: 147 ng/mL (ref 15–150)
Folate, Hemolysate: 292 ng/mL
Folate, RBC: 667 ng/mL (ref >498)
Hematocrit: 43.8 % (ref 34.0–46.6)
Iron Saturation: 20 % (ref 15–55)
Iron: 56 ug/dL (ref 27–159)
Retic Ct Pct: 1.5 % (ref 0.6–2.6)
Total Iron Binding Capacity: 276 ug/dL (ref 250–450)
UIBC: 220 ug/dL (ref 131–425)
Vitamin B-12: 214 pg/mL — ABNORMAL LOW (ref 232–1245)

## 2021-10-03 LAB — VITAMIN D 25 HYDROXY (VIT D DEFICIENCY, FRACTURES): Vit D, 25-Hydroxy: 37.4 ng/mL (ref 30.0–100.0)

## 2021-10-03 NOTE — Progress Notes (Signed)
Chief Complaint:   OBESITY Katelyn Walker is here to discuss her progress with her obesity treatment plan along with follow-up of her obesity related diagnoses. See Medical Weight Management Flowsheet for complete bioelectrical impedance results.  Today's visit was #: 55 Starting weight: 242 lbs Starting date: 05/14/2018 Weight change since last visit: 22 lbs Total lbs lost to date: 22 lbs Total weight loss percentage to date: -9.09%  Nutrition Plan: Keeping a food journal and adhering to recommended goals of 1200 calories and 90 grams of protein daily. Activity: PT for 60 minutes 2 times per week. Anti-obesity medications: Mounjaro 5 mg subcutaneously weekly. Reported side effects: None.  Interim History: Katelyn Walker has a follow-up with the surgeon on Monday.  PT ends at the end of the month.  Okay to start swimming in the spring.  Assessment/Plan:   1. Impaired fasting glucose, with polyphagia Not at goal. Current treatment: Mounjaro 5 mg subcutaneously weekly, phentermine 15 mg daily.    Plan:  Okay to discontinue phentermine.  Increase Mounjaro to 7.5 mg subcutaneously weekly, as per below.  She will continue to focus on protein-rich, low simple carbohydrate foods. We reviewed the importance of hydration, regular exercise for stress reduction, and restorative sleep.  Check CMP and insulin level today.  - Comprehensive metabolic panel - Insulin, random - Increase tirzepatide (MOUNJARO) 7.5 MG/0.5ML Pen; Inject 7.5 mg into the skin once a week.  Dispense: 2 mL; Refill: 0  2. Hiatal hernia New. She is scheduled for pH manometry on 12/20/2021.  3. Vitamin D deficiency Not at goal.   Plan: Continue to take prescription Vitamin D @50 ,000 IU every week as prescribed.  Check vitamin D level today.  Lab Results  Component Value Date   VD25OH 37.4 10/02/2021   VD25OH 47.0 01/30/2021   VD25OH 45.5 06/29/2020   - Refill Vitamin D, Ergocalciferol, (DRISDOL) 1.25 MG (50000 UNIT) CAPS  capsule; Take 1 capsule (50,000 Units total) by mouth every 7 (seven) days.  Dispense: 4 capsule; Refill: 0 - VITAMIN D 25 Hydroxy (Vit-D Deficiency, Fractures)  4. Iron deficiency Nutrition: Iron-rich foods include dark leafy greens, red and white meats, eggs, seafood, and beans.  Certain foods and drinks prevent your body from absorbing iron properly. Avoid eating these foods in the same meal as iron-rich foods or with iron supplements. These foods include: coffee, black tea, and red wine; milk, dairy products, and foods that are high in calcium; beans and soybeans; whole grains. Constipation can be a side effect of iron supplementation. Increased water and fiber intake are helpful. Water goal: > 2 liters/day. Fiber goal: > 25 grams/day.  Will check anemia panel today.  - Anemia panel  5. B12 deficiency Lab Results  Component Value Date   VITAMINB12 214 (L) 10/02/2021   Supplementation: None.   Plan:  Check vitamin B12 level today.  - Vitamin B12  6. Mixed hyperlipidemia Course: Not at goal. Lipid-lowering medications: None.   Plan: Dietary changes: Increase soluble fiber, decrease simple carbohydrates, decrease saturated fat. Exercise changes: Moderate to vigorous-intensity aerobic activity 150 minutes per week or as tolerated. We will continue to monitor along with PCP/specialists as it pertains to her weight loss journey.  Check lipid panel today.  Lab Results  Component Value Date   CHOL 203 (H) 10/02/2021   HDL 51 10/02/2021   LDLCALC 126 (H) 10/02/2021   TRIG 144 10/02/2021   CHOLHDL 3.8 01/30/2021   Lab Results  Component Value Date   ALT 12 10/02/2021  AST 13 10/02/2021   ALKPHOS 96 10/02/2021   BILITOT 0.3 10/02/2021   The 10-year ASCVD risk score (Arnett DK, et al., 2019) is: 2.7%   Values used to calculate the score:     Age: 16 years     Sex: Female     Is Non-Hispanic African American: No     Diabetic: No     Tobacco smoker: No     Systolic Blood  Pressure: 148 mmHg     Is BP treated: Yes     HDL Cholesterol: 51 mg/dL     Total Cholesterol: 203 mg/dL  - Lipid Panel With LDL/HDL Ratio  7. Essential hypertension Elevated. Medications: HCTZ 12.5 mg daily.   Plan: Avoid buying foods that are: processed, frozen, or prepackaged to avoid excess salt. We will watch for signs of hypotension as she continues lifestyle modifications. Will check CBC and CMP today.  BP Readings from Last 3 Encounters:  10/02/21 (!) 148/88  09/21/21 (!) 148/87  07/04/21 126/88   Lab Results  Component Value Date   CREATININE 0.92 10/02/2021   - CBC with Differential/Platelet - Comprehensive metabolic panel  8. Obesity, current BMI 34.5  Course: Katelyn Walker is currently in the action stage of change. As such, her goal is to continue with weight loss efforts.   Nutrition goals: She has agreed to keeping a food journal and adhering to recommended goals of 1200 calories and 90 grams of protein.   Exercise goals:  As is.  Behavioral modification strategies: increasing lean protein intake, decreasing simple carbohydrates, increasing vegetables, and increasing water intake.  Katelyn Walker has agreed to follow-up with our clinic in 4 weeks. She was informed of the importance of frequent follow-up visits to maximize her success with intensive lifestyle modifications for her multiple health conditions.   Katelyn Walker was informed we would discuss her lab results at her next visit unless there is a critical issue that needs to be addressed sooner. Katelyn Walker agreed to keep her next visit at the agreed upon time to discuss these results.  Objective:   Blood pressure (!) 148/88, pulse 78, temperature 97.8 F (36.6 C), temperature source Oral, height 5\' 7"  (1.702 m), weight 220 lb (99.8 kg), SpO2 99 %. Body mass index is 34.46 kg/m.  General: Cooperative, alert, well developed, in no acute distress. HEENT: Conjunctivae and lids unremarkable. Cardiovascular: Regular rhythm.   Lungs: Normal work of breathing. Neurologic: No focal deficits.   Lab Results  Component Value Date   CREATININE 0.92 10/02/2021   BUN 7 10/02/2021   NA 140 10/02/2021   K 3.9 10/02/2021   CL 98 10/02/2021   CO2 24 10/02/2021   Lab Results  Component Value Date   ALT 12 10/02/2021   AST 13 10/02/2021   ALKPHOS 96 10/02/2021   BILITOT 0.3 10/02/2021   Lab Results  Component Value Date   HGBA1C 5.6 01/30/2021   HGBA1C 5.7 (H) 06/29/2020   HGBA1C 5.6 01/04/2020   HGBA1C 5.6 09/14/2019   HGBA1C 5.8 (H) 03/19/2019   Lab Results  Component Value Date   INSULIN 17.2 10/02/2021   INSULIN 23.7 01/30/2021   INSULIN 21.6 01/04/2020   INSULIN 14.9 09/14/2019   INSULIN 17.7 03/19/2019   Lab Results  Component Value Date   TSH 4.080 01/04/2020   Lab Results  Component Value Date   CHOL 203 (H) 10/02/2021   HDL 51 10/02/2021   LDLCALC 126 (H) 10/02/2021   TRIG 144 10/02/2021   CHOLHDL 3.8 01/30/2021  Lab Results  Component Value Date   VD25OH 37.4 10/02/2021   VD25OH 47.0 01/30/2021   VD25OH 45.5 06/29/2020   Lab Results  Component Value Date   WBC 7.0 10/02/2021   HGB 14.2 10/02/2021   HCT 43.8 10/02/2021   MCV 84 10/02/2021   PLT 272 10/02/2021   Lab Results  Component Value Date   IRON 56 10/02/2021   TIBC 276 10/02/2021   FERRITIN 147 10/02/2021   Attestation Statements:   Reviewed by clinician on day of visit: allergies, medications, problem list, medical history, surgical history, family history, social history, and previous encounter notes.  I, Insurance claims handler, CMA, am acting as transcriptionist for Helane Rima, DO  I have reviewed the above documentation for accuracy and completeness, and I agree with the above. -  Helane Rima, DO, MS, FAAFP, DABOM - Family and Bariatric Medicine.

## 2021-10-04 MED ORDER — TIRZEPATIDE 7.5 MG/0.5ML ~~LOC~~ SOAJ
7.5000 mg | SUBCUTANEOUS | 0 refills | Status: DC
Start: 2021-10-04 — End: 2021-10-30

## 2021-10-05 DIAGNOSIS — M25672 Stiffness of left ankle, not elsewhere classified: Secondary | ICD-10-CM | POA: Diagnosis not present

## 2021-10-05 DIAGNOSIS — R262 Difficulty in walking, not elsewhere classified: Secondary | ICD-10-CM | POA: Diagnosis not present

## 2021-10-05 DIAGNOSIS — M6281 Muscle weakness (generalized): Secondary | ICD-10-CM | POA: Diagnosis not present

## 2021-10-06 ENCOUNTER — Telehealth: Payer: Self-pay | Admitting: Hematology

## 2021-10-06 NOTE — Telephone Encounter (Signed)
Rescheduled upcoming appointment per patient's request. Patient is aware of changes. 

## 2021-10-09 ENCOUNTER — Telehealth: Payer: Self-pay | Admitting: Hematology

## 2021-10-09 DIAGNOSIS — Z9889 Other specified postprocedural states: Secondary | ICD-10-CM | POA: Diagnosis not present

## 2021-10-09 NOTE — Telephone Encounter (Signed)
Rescheduled 11/07 appointment due to provider pal, patient has been called and notified.

## 2021-10-10 DIAGNOSIS — R262 Difficulty in walking, not elsewhere classified: Secondary | ICD-10-CM | POA: Diagnosis not present

## 2021-10-10 DIAGNOSIS — M6281 Muscle weakness (generalized): Secondary | ICD-10-CM | POA: Diagnosis not present

## 2021-10-10 DIAGNOSIS — M25672 Stiffness of left ankle, not elsewhere classified: Secondary | ICD-10-CM | POA: Diagnosis not present

## 2021-10-12 ENCOUNTER — Telehealth: Payer: Self-pay

## 2021-10-12 DIAGNOSIS — M6281 Muscle weakness (generalized): Secondary | ICD-10-CM | POA: Diagnosis not present

## 2021-10-12 DIAGNOSIS — M25672 Stiffness of left ankle, not elsewhere classified: Secondary | ICD-10-CM | POA: Diagnosis not present

## 2021-10-12 DIAGNOSIS — R262 Difficulty in walking, not elsewhere classified: Secondary | ICD-10-CM | POA: Diagnosis not present

## 2021-10-12 NOTE — Telephone Encounter (Signed)
Called hospital scheduling to see if there had been any cancellations for a sooner appt for esophageal manometry/24 hour pH study. I was informed by Noreene Larsson that no appts are available and they are scheduling in January. Will update patient via my chart.

## 2021-10-14 ENCOUNTER — Encounter: Payer: Self-pay | Admitting: Hematology and Oncology

## 2021-10-17 ENCOUNTER — Encounter: Payer: Self-pay | Admitting: Hematology and Oncology

## 2021-10-17 DIAGNOSIS — M6281 Muscle weakness (generalized): Secondary | ICD-10-CM | POA: Diagnosis not present

## 2021-10-17 DIAGNOSIS — R262 Difficulty in walking, not elsewhere classified: Secondary | ICD-10-CM | POA: Diagnosis not present

## 2021-10-17 DIAGNOSIS — M25672 Stiffness of left ankle, not elsewhere classified: Secondary | ICD-10-CM | POA: Diagnosis not present

## 2021-10-18 ENCOUNTER — Encounter: Payer: Self-pay | Admitting: Hematology and Oncology

## 2021-10-18 DIAGNOSIS — M6281 Muscle weakness (generalized): Secondary | ICD-10-CM | POA: Diagnosis not present

## 2021-10-18 DIAGNOSIS — R262 Difficulty in walking, not elsewhere classified: Secondary | ICD-10-CM | POA: Diagnosis not present

## 2021-10-18 DIAGNOSIS — M25672 Stiffness of left ankle, not elsewhere classified: Secondary | ICD-10-CM | POA: Diagnosis not present

## 2021-10-19 ENCOUNTER — Encounter: Payer: Self-pay | Admitting: Hematology and Oncology

## 2021-10-20 ENCOUNTER — Inpatient Hospital Stay: Payer: BC Managed Care – PPO | Attending: Hematology

## 2021-10-20 ENCOUNTER — Encounter: Payer: Self-pay | Admitting: Hematology and Oncology

## 2021-10-20 ENCOUNTER — Other Ambulatory Visit: Payer: Self-pay

## 2021-10-20 DIAGNOSIS — E611 Iron deficiency: Secondary | ICD-10-CM

## 2021-10-20 DIAGNOSIS — E538 Deficiency of other specified B group vitamins: Secondary | ICD-10-CM | POA: Diagnosis not present

## 2021-10-20 DIAGNOSIS — K449 Diaphragmatic hernia without obstruction or gangrene: Secondary | ICD-10-CM | POA: Diagnosis not present

## 2021-10-20 DIAGNOSIS — E559 Vitamin D deficiency, unspecified: Secondary | ICD-10-CM | POA: Diagnosis not present

## 2021-10-20 DIAGNOSIS — Z6834 Body mass index (BMI) 34.0-34.9, adult: Secondary | ICD-10-CM | POA: Diagnosis not present

## 2021-10-20 DIAGNOSIS — Z79899 Other long term (current) drug therapy: Secondary | ICD-10-CM | POA: Insufficient documentation

## 2021-10-20 DIAGNOSIS — K219 Gastro-esophageal reflux disease without esophagitis: Secondary | ICD-10-CM | POA: Diagnosis not present

## 2021-10-20 DIAGNOSIS — E6609 Other obesity due to excess calories: Secondary | ICD-10-CM | POA: Diagnosis not present

## 2021-10-20 DIAGNOSIS — D509 Iron deficiency anemia, unspecified: Secondary | ICD-10-CM | POA: Insufficient documentation

## 2021-10-20 LAB — IRON AND TIBC
Iron: 52 ug/dL (ref 41–142)
Saturation Ratios: 19 % — ABNORMAL LOW (ref 21–57)
TIBC: 270 ug/dL (ref 236–444)
UIBC: 218 ug/dL (ref 120–384)

## 2021-10-20 LAB — CBC WITH DIFFERENTIAL/PLATELET
Abs Immature Granulocytes: 0.02 10*3/uL (ref 0.00–0.07)
Basophils Absolute: 0 10*3/uL (ref 0.0–0.1)
Basophils Relative: 1 %
Eosinophils Absolute: 0.1 10*3/uL (ref 0.0–0.5)
Eosinophils Relative: 1 %
HCT: 39.8 % (ref 36.0–46.0)
Hemoglobin: 13.5 g/dL (ref 12.0–15.0)
Immature Granulocytes: 0 %
Lymphocytes Relative: 22 %
Lymphs Abs: 1.8 10*3/uL (ref 0.7–4.0)
MCH: 27 pg (ref 26.0–34.0)
MCHC: 33.9 g/dL (ref 30.0–36.0)
MCV: 79.6 fL — ABNORMAL LOW (ref 80.0–100.0)
Monocytes Absolute: 0.4 10*3/uL (ref 0.1–1.0)
Monocytes Relative: 6 %
Neutro Abs: 5.5 10*3/uL (ref 1.7–7.7)
Neutrophils Relative %: 70 %
Platelets: 257 10*3/uL (ref 150–400)
RBC: 5 MIL/uL (ref 3.87–5.11)
RDW: 12.7 % (ref 11.5–15.5)
WBC: 7.9 10*3/uL (ref 4.0–10.5)
nRBC: 0 % (ref 0.0–0.2)

## 2021-10-20 LAB — VITAMIN D 25 HYDROXY (VIT D DEFICIENCY, FRACTURES): Vit D, 25-Hydroxy: 76.61 ng/mL (ref 30–100)

## 2021-10-20 LAB — VITAMIN B12: Vitamin B-12: 130 pg/mL — ABNORMAL LOW (ref 180–914)

## 2021-10-20 LAB — FERRITIN: Ferritin: 103 ng/mL (ref 11–307)

## 2021-10-21 ENCOUNTER — Encounter: Payer: Self-pay | Admitting: Hematology and Oncology

## 2021-10-23 ENCOUNTER — Ambulatory Visit: Payer: Self-pay | Admitting: Hematology

## 2021-10-23 ENCOUNTER — Encounter: Payer: Self-pay | Admitting: Hematology and Oncology

## 2021-10-23 ENCOUNTER — Ambulatory Visit: Payer: Self-pay | Admitting: Hematology and Oncology

## 2021-10-24 ENCOUNTER — Other Ambulatory Visit: Payer: Self-pay

## 2021-10-24 ENCOUNTER — Telehealth: Payer: Self-pay | Admitting: Hematology

## 2021-10-24 ENCOUNTER — Encounter: Payer: Self-pay | Admitting: Hematology

## 2021-10-24 ENCOUNTER — Inpatient Hospital Stay (HOSPITAL_BASED_OUTPATIENT_CLINIC_OR_DEPARTMENT_OTHER): Payer: BC Managed Care – PPO | Admitting: Hematology

## 2021-10-24 ENCOUNTER — Inpatient Hospital Stay: Payer: BC Managed Care – PPO

## 2021-10-24 ENCOUNTER — Encounter: Payer: Self-pay | Admitting: Hematology and Oncology

## 2021-10-24 VITALS — BP 154/95 | HR 84 | Temp 97.8°F | Resp 17 | Wt 219.3 lb

## 2021-10-24 DIAGNOSIS — E538 Deficiency of other specified B group vitamins: Secondary | ICD-10-CM

## 2021-10-24 DIAGNOSIS — E611 Iron deficiency: Secondary | ICD-10-CM

## 2021-10-24 DIAGNOSIS — E559 Vitamin D deficiency, unspecified: Secondary | ICD-10-CM | POA: Diagnosis not present

## 2021-10-24 DIAGNOSIS — Z79899 Other long term (current) drug therapy: Secondary | ICD-10-CM | POA: Diagnosis not present

## 2021-10-24 DIAGNOSIS — D509 Iron deficiency anemia, unspecified: Secondary | ICD-10-CM | POA: Insufficient documentation

## 2021-10-24 DIAGNOSIS — D500 Iron deficiency anemia secondary to blood loss (chronic): Secondary | ICD-10-CM

## 2021-10-24 MED ORDER — CYANOCOBALAMIN 1000 MCG/ML IJ SOLN: 1000.0000 ug | Freq: Once | INTRAMUSCULAR | Status: DC

## 2021-10-24 MED ORDER — CYANOCOBALAMIN 1000 MCG/ML IJ SOLN: 1000.0000 ug | INTRAMUSCULAR | 0 refills | Status: DC

## 2021-10-24 MED ORDER — CYANOCOBALAMIN 1000 MCG/ML IJ SOLN
1000.0000 ug | Freq: Once | INTRAMUSCULAR | Status: AC
  Administered 2021-10-24: 1000 ug via INTRAMUSCULAR
  Filled 2021-10-24: qty 1

## 2021-10-24 NOTE — Telephone Encounter (Signed)
Left message with follow-up appointments per 1/18 los. °

## 2021-10-24 NOTE — Progress Notes (Signed)
B-12 injection released under incorrect encounter, given under correct encounter, se MAR notes. Pt given B-12 injection, instructed on how to self-administer. All questions answered/no further questions at this time. pt discharged in stable condition, ambulatory to lobby.

## 2021-10-24 NOTE — Progress Notes (Signed)
[facility]   [phone] [phone]) [phone]   Clinic Follow up Note   Patient Care Team: Harlan Stains, MD as PCP - General (Family Medicine)  Date of Service:  10/24/2021  CHIEF COMPLAINT: f/u of iron deficiency anemia  CURRENT THERAPY:  IV iron as needed, last 03/2021  ASSESSMENT & PLAN:  Katelyn Walker is a 50 y.o. female with   1. Iron Deficiency Anemia -presented with fatigue and lightheadedness. Lab work 01/2021 showed low iron and ferritin levels, hgb WNL. Initially referred to Dr. Chryl Heck 02/2021 -she tried oral iron but had no significant improvement. -last colonoscopy in 2019 was benign with some internal hemorrhoids. She is on birth control, periods are regular and not heavy. Early Chars disease work up was negative, I still suspect she has iron absorption issue likely from hiatal hernia  -she received 5 doses of IV venofer in 03/2021.  -endoscopy 09/21/21 with Dr. Bryan Lemma showed hiatal hernia, gastric biopsies showed only mild chronic inflammation. -iron level last week was normal, but trending down   2. Vitamin B12 and D deficiencies -currently on oral supplements. -B12 was 130, and D was WNL, on 10/20/21. -I discussed B12 injections to increase her level. She can get these here, or she can do them at home. We will give her one here today, then she will start home injections once a week.   PLAN: -B12 injection today -continue B12 injections at home once a weekX7 then monthly, I called in today. -f/u with Dr. Chryl Heck and B12 injection in 3 months, with lab a week before   No problem-specific Assessment & Plan notes found for this encounter.   INTERVAL HISTORY:  Katelyn Walker is here for a follow up of anemia. She was last seen by Dr. Chryl Heck on 06/20/21. I am seeing this patient while Dr. Chryl Heck is on maternity leave. She presents to the clinic alone. She reports feeling well overall. She notes she broke her leg in 07/2021.   All other  systems were reviewed with the patient and are negative.  MEDICAL HISTORY:  Past Medical History:  Diagnosis Date   Back pain    Dyspnea    Elevated blood pressure reading without diagnosis of hypertension    Esophageal ulcer    GERD (gastroesophageal reflux disease)    Hot flashes    Hypothyroid    Joint pain    Sciatica, left side    Vitamin B 12 deficiency    Vitamin D deficiency     SURGICAL HISTORY: Past Surgical History:  Procedure Laterality Date   ANKLE FRACTURE SURGERY Left 07/18/2021   DILATION AND CURETTAGE OF UTERUS     2011    OVARIAN CYST SURGERY     2009, 2013    I have reviewed the social history and family history with the patient and they are unchanged from previous note.  ALLERGIES:  has No Known Allergies.  MEDICATIONS:  Current Outpatient Medications  Medication Sig Dispense Refill   cyanocobalamin (,VITAMIN B-12,) 1000 MCG/ML injection Inject 1 mL (1,000 mcg total) into the muscle once a week. 8 mL 0   buPROPion (WELLBUTRIN XL) 150 MG 24 hr tablet Take 1 tablet (150 mg total) by mouth daily. 90 tablet 0   hydrochlorothiazide (MICROZIDE) 12.5 MG capsule Take 1 capsule (12.5 mg total) by mouth daily. 30 capsule 0   Norgestimate-Eth Estradiol (ESTARYLLA PO) Take 28 mg by mouth.     omeprazole (PRILOSEC) 40 MG capsule Take 1 capsule (40 mg  total) by mouth daily. 30 capsule 6   tirzepatide (MOUNJARO) 7.5 MG/0.5ML Pen Inject 7.5 mg into the skin once a week. 2 mL 0   traZODone (DESYREL) 50 MG tablet Take 0.5-1 tablets (25-50 mg total) by mouth at bedtime as needed for sleep. 90 tablet 0   Vitamin D, Ergocalciferol, (DRISDOL) 1.25 MG (50000 UNIT) CAPS capsule Take 1 capsule (50,000 Units total) by mouth every 7 (seven) days. 4 capsule 0   Current Facility-Administered Medications  Medication Dose Route Frequency Provider Last Rate Last Admin   cyanocobalamin ((VITAMIN B-12)) injection 1,000 mcg  1,000 mcg Intramuscular Once Truitt Merle, MD        PHYSICAL  EXAMINATION: ECOG PERFORMANCE STATUS: 1 - Symptomatic but completely ambulatory  Vitals:   10/24/21 0832  BP: (!) 154/95  Pulse: 84  Resp: 17  Temp: 97.8 F (36.6 C)  SpO2: 100%   Wt Readings from Last 3 Encounters:  10/24/21 219 lb 4.8 oz (99.5 kg)  10/02/21 220 lb (99.8 kg)  09/21/21 245 lb (111.1 kg)     GENERAL:alert, no distress and comfortable SKIN: skin color normal, no rashes or significant lesions EYES: normal, Conjunctiva are pink and non-injected, sclera clear  NEURO: alert & oriented x 3 with fluent speech  LABORATORY DATA:  I have reviewed the data as listed CBC Latest Ref Rng & Units 10/20/2021 10/02/2021 06/13/2021  WBC 4.0 - 10.5 K/uL 7.9 7.0 9.8  Hemoglobin 12.0 - 15.0 g/dL 13.5 14.2 13.9  Hematocrit 36.0 - 46.0 % 39.8 43.8 42.2  Platelets 150 - 400 K/uL 257 272 291     CMP Latest Ref Rng & Units 10/02/2021 01/30/2021 08/31/2020  Glucose 70 - 99 mg/dL 85 87 109(H)  BUN 6 - 24 mg/dL 7 12 10   Creatinine 0.57 - 1.00 mg/dL 0.92 0.85 0.98  Sodium 134 - 144 mmol/L 140 138 141  Potassium 3.5 - 5.2 mmol/L 3.9 4.5 4.0  Chloride 96 - 106 mmol/L 98 100 101  CO2 20 - 29 mmol/L 24 23 24   Calcium 8.7 - 10.2 mg/dL 8.7 8.9 9.0  Total Protein 6.0 - 8.5 g/dL 6.8 6.4 7.1  Total Bilirubin 0.0 - 1.2 mg/dL 0.3 <0.2 <0.2  Alkaline Phos 44 - 121 IU/L 96 90 97  AST 0 - 40 IU/L 13 13 12   ALT 0 - 32 IU/L 12 8 6       RADIOGRAPHIC STUDIES: I have personally reviewed the radiological images as listed and agreed with the findings in the report. No results found.    Orders Placed This Encounter  Procedures   Intrinsic factor antibodies    Standing Status:   Future    Standing Expiration Date:   10/24/2022   CBC with Differential ([facility] Only)    Standing Status:   Future    Standing Expiration Date:   10/24/2022   Ferritin    Standing Status:   Future    Standing Expiration Date:   10/24/2022   Iron and TIBC    Standing Status:   Future    Standing Expiration  Date:   10/24/2022   Vitamin B12    Standing Status:   Future    Standing Expiration Date:   10/24/2022   SCHEDULING COMMUNICATION INJECTION    Schedule 1 hour injection appointment   Treatment conditions    Standing Status:   Standing    Number of Occurrences:   1    Order Specific Question:   Did you provide treatment parameters  in the comments section?    Answer:   No   All questions were answered. The patient knows to call the clinic with any problems, questions or concerns. No barriers to learning was detected. The total time spent in the appointment was 30 minutes.     Malachy Mood, MD 10/24/2021   I, Mickie Bail, am acting as scribe for Malachy Mood, MD.   I have reviewed the above documentation for accuracy and completeness, and I agree with the above.

## 2021-10-25 ENCOUNTER — Encounter: Payer: Self-pay | Admitting: Hematology and Oncology

## 2021-10-25 DIAGNOSIS — M25672 Stiffness of left ankle, not elsewhere classified: Secondary | ICD-10-CM | POA: Diagnosis not present

## 2021-10-25 DIAGNOSIS — M6281 Muscle weakness (generalized): Secondary | ICD-10-CM | POA: Diagnosis not present

## 2021-10-25 DIAGNOSIS — R262 Difficulty in walking, not elsewhere classified: Secondary | ICD-10-CM | POA: Diagnosis not present

## 2021-10-26 ENCOUNTER — Telehealth: Payer: Self-pay

## 2021-10-26 NOTE — Telephone Encounter (Signed)
Sooner appt became available for esophageal manometry with 24 hour pH study/impedance. Patient would like to proceed with moving her appt to Wednesday, 12/13/21 at 10:30 am. Pt is aware that she will need to arrive at [facility] by 10 am. Pt is aware that she will need to stop PPI 7 days prior to her appt. Pt will adjust the date on her current instructions. Pt verbalized understanding of all information and had no concerns at the end of the call.  Secure staff message sent to [facility] letting her know that appt date has changed.

## 2021-10-30 ENCOUNTER — Other Ambulatory Visit: Payer: Self-pay

## 2021-10-30 ENCOUNTER — Encounter: Payer: Self-pay | Admitting: Hematology and Oncology

## 2021-10-30 ENCOUNTER — Ambulatory Visit (INDEPENDENT_AMBULATORY_CARE_PROVIDER_SITE_OTHER): Payer: BC Managed Care – PPO | Admitting: Family Medicine

## 2021-10-30 ENCOUNTER — Encounter (INDEPENDENT_AMBULATORY_CARE_PROVIDER_SITE_OTHER): Payer: Self-pay | Admitting: Family Medicine

## 2021-10-30 VITALS — BP 132/84 | HR 82 | Temp 98.2°F | Ht 67.0 in | Wt 214.0 lb

## 2021-10-30 DIAGNOSIS — E611 Iron deficiency: Secondary | ICD-10-CM

## 2021-10-30 DIAGNOSIS — R7301 Impaired fasting glucose: Secondary | ICD-10-CM

## 2021-10-30 DIAGNOSIS — E538 Deficiency of other specified B group vitamins: Secondary | ICD-10-CM

## 2021-10-30 DIAGNOSIS — K449 Diaphragmatic hernia without obstruction or gangrene: Secondary | ICD-10-CM | POA: Diagnosis not present

## 2021-10-30 DIAGNOSIS — M79605 Pain in left leg: Secondary | ICD-10-CM

## 2021-10-30 DIAGNOSIS — Z6837 Body mass index (BMI) 37.0-37.9, adult: Secondary | ICD-10-CM

## 2021-10-30 MED ORDER — TIRZEPATIDE 10 MG/0.5ML ~~LOC~~ SOAJ: 10.0000 mg | SUBCUTANEOUS | 1 refills | Status: DC

## 2021-10-31 DIAGNOSIS — R262 Difficulty in walking, not elsewhere classified: Secondary | ICD-10-CM | POA: Diagnosis not present

## 2021-10-31 DIAGNOSIS — M25672 Stiffness of left ankle, not elsewhere classified: Secondary | ICD-10-CM | POA: Diagnosis not present

## 2021-10-31 DIAGNOSIS — M6281 Muscle weakness (generalized): Secondary | ICD-10-CM | POA: Diagnosis not present

## 2021-10-31 NOTE — Progress Notes (Signed)
Chief Complaint:   OBESITY Katelyn Walker is here to discuss her progress with her obesity treatment plan along with follow-up of her obesity related diagnoses. See Medical Weight Management Flowsheet for complete bioelectrical impedance results.  Today's visit was #: 56 Starting weight: 242 lbs Starting date: 05/14/2018 Weight change since last visit: 6 lbs Total lbs lost to date: 28 lbs Total weight loss percentage to date: -11.57%  Nutrition Plan: Keeping a food journal and adhering to recommended goals of 1200 calories and 90 grams of protein daily for 90% of the time. Activity: PT for 60 minutes 2 times per week.  Anti-obesity medications: Mounjaro 7.5 mg subcutaneously weekly. Reported side effects: None.  Interim History: Flora says she has mild nausea the day after her Mounjaro injection.  Mild constipation, hiatal hernia.  BMI of 35 required for repair.  BMI is 33 today.  Assessment/Plan:   1. Impaired fasting glucose, with polyphagia Improving, but not optimized. Current treatment: Mounjaro 7.5 mg subcutaneously weekly.    Plan:  Increase Mounjaro to 10 mg subcutaneously weekly, as per below. She will continue to focus on protein-rich, low simple carbohydrate foods. We reviewed the importance of hydration, regular exercise for stress reduction, and restorative sleep.  - Increase tirzepatide (MOUNJARO) 10 MG/0.5ML Pen; Inject 10 mg into the skin once a week.  Dispense: 2 mL; Refill: 1  2. Hiatal hernia Page is scheduled for pH manometry on 12/20/2021.  3. B12 deficiency Lab Results  Component Value Date   VITAMINB12 130 (L) 10/20/2021   Supplementation: Weekly vitamin B12 injections.   Plan:  Continue current treatment.   4. Iron deficiency Nutrition: Iron-rich foods include dark leafy greens, red and white meats, eggs, seafood, and beans.  Certain foods and drinks prevent your body from absorbing iron properly. Avoid eating these foods in the same meal as  iron-rich foods or with iron supplements. These foods include: coffee, black tea, and red wine; milk, dairy products, and foods that are high in calcium; beans and soybeans; whole grains. Constipation can be a side effect of iron supplementation. Increased water and fiber intake are helpful. Water goal: > 2 liters/day. Fiber goal: > 25 grams/day.  5. Left leg pain S/p fracture and surgery. Improving. This issue directly impacts care plan for optimization of BMI and metabolic health as it impacts the patient's ability to make lifestyle changes. We will continue to monitor symptoms as they relate to her weight loss journey.  6. Obesity, current BMI 33.5  Course: Aevah is currently in the action stage of change. As such, her goal is to continue with weight loss efforts.   Nutrition goals: She has agreed to keeping a food journal and adhering to recommended goals of 1200 calories and 90 grams of protein.   Exercise goals:  As is.  Behavioral modification strategies: increasing lean protein intake, decreasing simple carbohydrates, increasing vegetables, and increasing water intake.  [name] has agreed to follow-up with our clinic in 4 weeks. She was informed of the importance of frequent follow-up visits to maximize her success with intensive lifestyle modifications for her multiple health conditions.   Objective:   Blood pressure 132/84, pulse 82, temperature 98.2 F (36.8 C), temperature source Oral, height 5\' 7"  (1.702 m), weight 214 lb (97.1 kg), SpO2 99 %. Body mass index is 33.52 kg/m.  General: Cooperative, alert, well developed, in no acute distress. HEENT: Conjunctivae and lids unremarkable. Cardiovascular: Regular rhythm.  Lungs: Normal work of breathing. Neurologic: No focal deficits.  Lab Results  Component Value Date   CREATININE 0.92 10/02/2021   BUN 7 10/02/2021   NA 140 10/02/2021   K 3.9 10/02/2021   CL 98 10/02/2021   CO2 24 10/02/2021   Lab Results  Component  Value Date   ALT 12 10/02/2021   AST 13 10/02/2021   ALKPHOS 96 10/02/2021   BILITOT 0.3 10/02/2021   Lab Results  Component Value Date   HGBA1C 5.6 01/30/2021   HGBA1C 5.7 (H) 06/29/2020   HGBA1C 5.6 01/04/2020   HGBA1C 5.6 09/14/2019   HGBA1C 5.8 (H) 03/19/2019   Lab Results  Component Value Date   INSULIN 17.2 10/02/2021   INSULIN 23.7 01/30/2021   INSULIN 21.6 01/04/2020   INSULIN 14.9 09/14/2019   INSULIN 17.7 03/19/2019   Lab Results  Component Value Date   TSH 4.080 01/04/2020   Lab Results  Component Value Date   CHOL 203 (H) 10/02/2021   HDL 51 10/02/2021   LDLCALC 126 (H) 10/02/2021   TRIG 144 10/02/2021   CHOLHDL 3.8 01/30/2021   Lab Results  Component Value Date   VD25OH 76.61 10/20/2021   VD25OH 37.4 10/02/2021   VD25OH 47.0 01/30/2021   Lab Results  Component Value Date   WBC 7.9 10/20/2021   HGB 13.5 10/20/2021   HCT 39.8 10/20/2021   MCV 79.6 (L) 10/20/2021   PLT 257 10/20/2021   Lab Results  Component Value Date   IRON 52 10/20/2021   TIBC 270 10/20/2021   FERRITIN 103 10/20/2021   Attestation Statements:   Reviewed by clinician on day of visit: allergies, medications, problem list, medical history, surgical history, family history, social history, and previous encounter notes.  I, Insurance claims handler, CMA, am acting as transcriptionist for Helane Rima, DO  I have reviewed the above documentation for accuracy and completeness, and I agree with the above. -  Helane Rima, DO, MS, FAAFP, DABOM - Family and Bariatric Medicine.

## 2021-11-17 ENCOUNTER — Other Ambulatory Visit: Payer: Self-pay

## 2021-11-20 ENCOUNTER — Encounter: Payer: Self-pay | Admitting: Hematology and Oncology

## 2021-11-20 ENCOUNTER — Ambulatory Visit: Payer: Self-pay | Admitting: Hematology and Oncology

## 2021-11-20 DIAGNOSIS — M25672 Stiffness of left ankle, not elsewhere classified: Secondary | ICD-10-CM | POA: Diagnosis not present

## 2021-11-27 ENCOUNTER — Encounter (INDEPENDENT_AMBULATORY_CARE_PROVIDER_SITE_OTHER): Payer: Self-pay | Admitting: Family Medicine

## 2021-11-27 ENCOUNTER — Other Ambulatory Visit: Payer: Self-pay

## 2021-11-27 ENCOUNTER — Ambulatory Visit (INDEPENDENT_AMBULATORY_CARE_PROVIDER_SITE_OTHER): Payer: BC Managed Care – PPO | Admitting: Family Medicine

## 2021-11-27 VITALS — BP 134/88 | HR 80 | Temp 98.1°F | Ht 67.0 in | Wt 209.0 lb

## 2021-11-27 DIAGNOSIS — Z6837 Body mass index (BMI) 37.0-37.9, adult: Secondary | ICD-10-CM

## 2021-11-27 DIAGNOSIS — E559 Vitamin D deficiency, unspecified: Secondary | ICD-10-CM

## 2021-11-27 DIAGNOSIS — F3289 Other specified depressive episodes: Secondary | ICD-10-CM

## 2021-11-27 DIAGNOSIS — R7301 Impaired fasting glucose: Secondary | ICD-10-CM

## 2021-11-27 DIAGNOSIS — L304 Erythema intertrigo: Secondary | ICD-10-CM | POA: Diagnosis not present

## 2021-11-27 DIAGNOSIS — E66812 Obesity, class 2: Secondary | ICD-10-CM

## 2021-11-27 MED ORDER — BUPROPION HCL ER (XL) 150 MG PO TB24: 150.0000 mg | ORAL_TABLET | Freq: Every day | ORAL | 0 refills | Status: DC

## 2021-11-27 MED ORDER — TIRZEPATIDE 10 MG/0.5ML ~~LOC~~ SOAJ: 10.0000 mg | SUBCUTANEOUS | 1 refills | Status: DC

## 2021-11-27 MED ORDER — MUPIROCIN 2 % EX OINT: 1.0000 "application " | TOPICAL_OINTMENT | Freq: Two times a day (BID) | CUTANEOUS | 0 refills | Status: DC

## 2021-11-27 MED ORDER — VITAMIN D (ERGOCALCIFEROL) 1.25 MG (50000 UNIT) PO CAPS: 50000.0000 [IU] | ORAL_CAPSULE | ORAL | 0 refills | Status: DC

## 2021-11-27 NOTE — Progress Notes (Signed)
Chief Complaint:   OBESITY Katelyn Walker is here to discuss her progress with her obesity treatment plan along with follow-up of her obesity related diagnoses. See Medical Weight Management Flowsheet for complete bioelectrical impedance results.  Today's visit was #: 57 Starting weight: 242 lbs Starting date: 05/14/2018 Weight change since last visit: 5 lbs Total lbs lost to date: 33 lbs Total weight loss percentage to date: -13.64%  Nutrition Plan: Keeping a food journal and adhering to recommended goals of 1200 calories and 90 grams of protein daily for 95% of the time. Activity: None. Anti-obesity medications: Mounjaro 10 mg subcutaneously weekly. Reported side effects: None.  Interim History: Katelyn Walker is okay to swim.  She got her boot off, but says she still has some pain.  Assessment/Plan:   1. Intertrigo With abrasions/skin tears. Start mupirocin ointment twice daily.  - Start mupirocin ointment (BACTROBAN) 2 %; Apply 1 application topically 2 (two) times daily.  Dispense: 22 g; Refill: 0  2. Impaired fasting glucose, with polyphagia Controlled. Current treatment: Mounjaro 10 mg subcutaneously weekly.    Plan: Continue Mounjaro 10 mg subcutaneously weekly, as per below.  She will continue to focus on protein-rich, low simple carbohydrate foods. We reviewed the importance of hydration, regular exercise for stress reduction, and restorative sleep.  - Refill tirzepatide (MOUNJARO) 10 MG/0.5ML Pen; Inject 10 mg into the skin once a week.  Dispense: 2 mL; Refill: 1  3. Vitamin D deficiency At goal. She is taking vitamin D 50,000 IU weekly.  Plan: Continue to take prescription Vitamin D @50 ,000 IU every week as prescribed.  Follow-up for routine testing of Vitamin D, at least 2-3 times per year to avoid over-replacement.  Lab Results  Component Value Date   VD25OH 76.61 10/20/2021   VD25OH 37.4 10/02/2021   VD25OH 47.0 01/30/2021   - Refill Vitamin D, Ergocalciferol,  (DRISDOL) 1.25 MG (50000 UNIT) CAPS capsule; Take 1 capsule (50,000 Units total) by mouth every 7 (seven) days.  Dispense: 12 capsule; Refill: 0  4. Other depression, with emotional eating At goal. Medication: Wellbutrin XL 150 mg daily.   Plan:  Refill Wellbutrin.  Discussed cues and consequences, how thoughts affect eating, model of thoughts, feelings, and behaviors, and strategies for change by focusing on the cue. Discussed cognitive distortions, coping thoughts, and how to change your thoughts.  - Refill buPROPion (WELLBUTRIN XL) 150 MG 24 hr tablet; Take 1 tablet (150 mg total) by mouth daily.  Dispense: 90 tablet; Refill: 0  5. Obesity, current BMI 32.7  Course: Katelyn Walker is currently in the action stage of change. As such, her goal is to continue with weight loss efforts.   Nutrition goals: She has agreed to keeping a food journal and adhering to recommended goals of 1200 calories and 90 grams of protein.   Exercise goals:  Swimming.  Behavioral modification strategies: increasing lean protein intake, decreasing simple carbohydrates, increasing vegetables, and increasing water intake.  Katelyn Walker has agreed to follow-up with our clinic in 4 weeks. She was informed of the importance of frequent follow-up visits to maximize her success with intensive lifestyle modifications for her multiple health conditions.   Objective:   Blood pressure 134/88, pulse 80, temperature 98.1 F (36.7 C), temperature source Oral, height 5\' 7"  (1.702 m), weight 209 lb (94.8 kg), SpO2 98 %. Body mass index is 32.73 kg/m.  General: Cooperative, alert, well developed, in no acute distress. HEENT: Conjunctivae and lids unremarkable. Cardiovascular: Regular rhythm.  Lungs: Normal work of breathing.  Neurologic: No focal deficits.   Lab Results  Component Value Date   CREATININE 0.92 10/02/2021   BUN 7 10/02/2021   NA 140 10/02/2021   K 3.9 10/02/2021   CL 98 10/02/2021   CO2 24 10/02/2021   Lab  Results  Component Value Date   ALT 12 10/02/2021   AST 13 10/02/2021   ALKPHOS 96 10/02/2021   BILITOT 0.3 10/02/2021   Lab Results  Component Value Date   HGBA1C 5.6 01/30/2021   HGBA1C 5.7 (H) 06/29/2020   HGBA1C 5.6 01/04/2020   HGBA1C 5.6 09/14/2019   HGBA1C 5.8 (H) 03/19/2019   Lab Results  Component Value Date   INSULIN 17.2 10/02/2021   INSULIN 23.7 01/30/2021   INSULIN 21.6 01/04/2020   INSULIN 14.9 09/14/2019   INSULIN 17.7 03/19/2019   Lab Results  Component Value Date   TSH 4.080 01/04/2020   Lab Results  Component Value Date   CHOL 203 (H) 10/02/2021   HDL 51 10/02/2021   LDLCALC 126 (H) 10/02/2021   TRIG 144 10/02/2021   CHOLHDL 3.8 01/30/2021   Lab Results  Component Value Date   VD25OH 76.61 10/20/2021   VD25OH 37.4 10/02/2021   VD25OH 47.0 01/30/2021   Lab Results  Component Value Date   WBC 7.9 10/20/2021   HGB 13.5 10/20/2021   HCT 39.8 10/20/2021   MCV 79.6 (L) 10/20/2021   PLT 257 10/20/2021   Lab Results  Component Value Date   IRON 52 10/20/2021   TIBC 270 10/20/2021   FERRITIN 103 10/20/2021   Attestation Statements:   Reviewed by clinician on day of visit: allergies, medications, problem list, medical history, surgical history, family history, social history, and previous encounter notes.  I, Insurance claims handler, CMA, am acting as transcriptionist for Helane Rima, DO  I have reviewed the above documentation for accuracy and completeness, and I agree with the above. -  Helane Rima, DO, MS, FAAFP, DABOM - Family and Bariatric Medicine.

## 2021-11-29 ENCOUNTER — Encounter (HOSPITAL_COMMUNITY): Payer: Self-pay | Admitting: Gastroenterology

## 2021-11-30 ENCOUNTER — Encounter: Payer: Self-pay | Admitting: Hematology and Oncology

## 2021-11-30 NOTE — Progress Notes (Signed)
Attempted to obtain medical history via telephone, unable to reach at this time. I left a voicemail to return pre surgical testing department's phone call.  

## 2021-12-03 ENCOUNTER — Encounter (INDEPENDENT_AMBULATORY_CARE_PROVIDER_SITE_OTHER): Payer: Self-pay | Admitting: Family Medicine

## 2021-12-04 NOTE — Telephone Encounter (Signed)
Pt last seen by Dr. Beasley.  

## 2021-12-05 ENCOUNTER — Encounter: Payer: Self-pay | Admitting: Gastroenterology

## 2021-12-13 ENCOUNTER — Encounter (HOSPITAL_COMMUNITY): Payer: Self-pay | Admitting: Gastroenterology

## 2021-12-13 ENCOUNTER — Ambulatory Visit (HOSPITAL_COMMUNITY)
Admission: RE | Admit: 2021-12-13 | Discharge: 2021-12-13 | Disposition: A | Discharge status: Home / Self Care | Payer: BC Managed Care – PPO | Attending: Gastroenterology | Admitting: Gastroenterology

## 2021-12-13 ENCOUNTER — Encounter (HOSPITAL_COMMUNITY)
Admission: RE | Disposition: A | Discharge status: Home / Self Care | Payer: Self-pay | Source: Home / Self Care | Attending: Gastroenterology

## 2021-12-13 DIAGNOSIS — R12 Heartburn: Secondary | ICD-10-CM

## 2021-12-13 DIAGNOSIS — K449 Diaphragmatic hernia without obstruction or gangrene: Secondary | ICD-10-CM | POA: Insufficient documentation

## 2021-12-13 DIAGNOSIS — K219 Gastro-esophageal reflux disease without esophagitis: Secondary | ICD-10-CM | POA: Diagnosis not present

## 2021-12-13 DIAGNOSIS — R1310 Dysphagia, unspecified: Secondary | ICD-10-CM

## 2021-12-13 HISTORY — PX: 24 HOUR PH STUDY: SHX5419

## 2021-12-13 HISTORY — PX: PH IMPEDANCE STUDY: SHX5565

## 2021-12-13 HISTORY — PX: ESOPHAGEAL MANOMETRY: SHX5429

## 2021-12-13 SURGERY — MONITORING, ESOPHAGEAL PH, 24 HOUR
Anesthesia: Choice

## 2021-12-13 MED ORDER — LIDOCAINE VISCOUS HCL 2 % MT SOLN
OROMUCOSAL | Status: AC
  Filled 2021-12-13: qty 15

## 2021-12-13 SURGICAL SUPPLY — 2 items
FACESHIELD LNG OPTICON STERILE (SAFETY) IMPLANT
GLOVE BIO SURGEON STRL SZ8 (GLOVE) ×6 IMPLANT

## 2021-12-13 NOTE — Progress Notes (Signed)
Esophageal manometry performed per protocol without complications.  Patient tolerated well. Ph probe placed per protocol without complications at 32.3 cm.  Education given on ph probe and diary.  Patient verbalized understanding.  Patient to return tomorrow to have ph probe removed.

## 2021-12-18 ENCOUNTER — Encounter: Payer: Self-pay | Admitting: Hematology and Oncology

## 2021-12-25 ENCOUNTER — Encounter (INDEPENDENT_AMBULATORY_CARE_PROVIDER_SITE_OTHER): Payer: Self-pay | Admitting: Family Medicine

## 2021-12-25 ENCOUNTER — Ambulatory Visit (INDEPENDENT_AMBULATORY_CARE_PROVIDER_SITE_OTHER): Payer: BC Managed Care – PPO | Admitting: Family Medicine

## 2021-12-25 ENCOUNTER — Other Ambulatory Visit: Payer: Self-pay

## 2021-12-25 VITALS — BP 136/87 | HR 80 | Temp 98.3°F | Ht 67.0 in | Wt 204.0 lb

## 2021-12-25 DIAGNOSIS — K449 Diaphragmatic hernia without obstruction or gangrene: Secondary | ICD-10-CM | POA: Diagnosis not present

## 2021-12-25 DIAGNOSIS — E538 Deficiency of other specified B group vitamins: Secondary | ICD-10-CM

## 2021-12-25 DIAGNOSIS — Z6832 Body mass index (BMI) 32.0-32.9, adult: Secondary | ICD-10-CM

## 2021-12-25 DIAGNOSIS — Z6837 Body mass index (BMI) 37.0-37.9, adult: Secondary | ICD-10-CM

## 2021-12-25 DIAGNOSIS — R7301 Impaired fasting glucose: Secondary | ICD-10-CM | POA: Diagnosis not present

## 2021-12-25 MED ORDER — CYANOCOBALAMIN 1000 MCG/ML IJ SOLN: 1000.0000 ug | INTRAMUSCULAR | 0 refills | Status: DC

## 2021-12-25 MED ORDER — TIRZEPATIDE 10 MG/0.5ML ~~LOC~~ SOAJ: 10.0000 mg | SUBCUTANEOUS | 1 refills | Status: DC

## 2021-12-25 MED ORDER — "BD SYRINGE/NEEDLE SLIP TIP 25G X 5/8"" 1 ML MISC": 0 refills | Status: DC

## 2021-12-26 NOTE — Progress Notes (Signed)
Chief Complaint:   OBESITY Katelyn Walker is here to discuss her progress with her obesity treatment plan along with follow-up of her obesity related diagnoses. See Medical Weight Management Flowsheet for complete bioelectrical impedance results.  Today's visit was #: 31 Starting weight: 242 lbs Starting date: 05/14/2018 Weight change since last visit: 5 lbs Total lbs lost to date: 38 lbs Total weight loss percentage to date: -15.70%  Nutrition Plan: Keeping a food journal and adhering to recommended goals of 1200 calories and 90 grams of protein daily for 90% of the time. Activity: None. Anti-obesity medications: Mounjaro 10 mg subcutaneously weekly. Reported side effects: None.  Interim History: Katelyn Walker had manometry and says it was traumatic.  She is awaiting results.  She has a follow up with Hematology in January.  She says she is happy with her weight loss.  Assessment/Plan:   1. Impaired fasting glucose, with polyphagia Controlled. Current treatment: Mounjaro 10 mg subcutaneously weekly.    Plan:  Continue Mounjaro 10 mg subcutaneously weekly.  Will refill today, as per below. She will continue to focus on protein-rich, low simple carbohydrate foods. We reviewed the importance of hydration, regular exercise for stress reduction, and restorative sleep.  - Refill tirzepatide (MOUNJARO) 10 MG/0.5ML Pen; Inject 10 mg into the skin once a week.  Dispense: 2 mL; Refill: 1  2. B12 deficiency Lab Results  Component Value Date   VITAMINB12 130 (L) 10/20/2021   Supplementation: Weekly vitamin B12 injections.   Plan:  Continue current treatment.  Will refill today.  Labs are to be rechecked on 1/31.  - Refill cyanocobalamin (,VITAMIN B-12,) 1000 MCG/ML injection; Inject 1 mL (1,000 mcg total) into the muscle once a week.  Dispense: 10 mL; Refill: 0 - Refill SYRINGE/NEEDLE, DISP, 1 ML (B-D 1CC SLIP TIP SYR 25GX5/8") 25G X 5/8" 1 ML MISC; Use to administer b12 injections  Dispense: 50  each; Refill: 0  3. Hiatal hernia Reviewed recent notes with GI and General Surgery. We will continue to monitor symptoms as they relate to her weight loss journey.  4. Obesity, current BMI 32.1  Course: Katelyn Walker is currently in the action stage of change. As such, her goal is to continue with weight loss efforts.   Nutrition goals: She has agreed to keeping a food journal and adhering to recommended goals of 1200 calories and 90 grams of protein.   Exercise goals:  As is.  Behavioral modification strategies: increasing lean protein intake, decreasing simple carbohydrates, increasing vegetables, and increasing water intake.  Katelyn Walker has agreed to follow-up with our clinic in 4 weeks. She was informed of the importance of frequent follow-up visits to maximize her success with intensive lifestyle modifications for her multiple health conditions.   Objective:   Blood pressure 136/87, pulse 80, temperature 98.3 F (36.8 C), temperature source Oral, height 5\' 7"  (1.702 m), weight 204 lb (92.5 kg), SpO2 99 %. Body mass index is 31.95 kg/m.  General: Cooperative, alert, well developed, in no acute distress. HEENT: Conjunctivae and lids unremarkable. Cardiovascular: Regular rhythm.  Lungs: Normal work of breathing. Neurologic: No focal deficits.   Lab Results  Component Value Date   CREATININE 0.92 10/02/2021   BUN 7 10/02/2021   NA 140 10/02/2021   K 3.9 10/02/2021   CL 98 10/02/2021   CO2 24 10/02/2021   Lab Results  Component Value Date   ALT 12 10/02/2021   AST 13 10/02/2021   ALKPHOS 96 10/02/2021   BILITOT 0.3 10/02/2021  Lab Results  Component Value Date   HGBA1C 5.6 01/30/2021   HGBA1C 5.7 (H) 06/29/2020   HGBA1C 5.6 01/04/2020   HGBA1C 5.6 09/14/2019   HGBA1C 5.8 (H) 03/19/2019   Lab Results  Component Value Date   INSULIN 17.2 10/02/2021   INSULIN 23.7 01/30/2021   INSULIN 21.6 01/04/2020   INSULIN 14.9 09/14/2019   INSULIN 17.7 03/19/2019   Lab Results   Component Value Date   TSH 4.080 01/04/2020   Lab Results  Component Value Date   CHOL 203 (H) 10/02/2021   HDL 51 10/02/2021   LDLCALC 126 (H) 10/02/2021   TRIG 144 10/02/2021   CHOLHDL 3.8 01/30/2021   Lab Results  Component Value Date   VD25OH 76.61 10/20/2021   VD25OH 37.4 10/02/2021   VD25OH 47.0 01/30/2021   Lab Results  Component Value Date   WBC 7.9 10/20/2021   HGB 13.5 10/20/2021   HCT 39.8 10/20/2021   MCV 79.6 (L) 10/20/2021   PLT 257 10/20/2021   Lab Results  Component Value Date   IRON 52 10/20/2021   TIBC 270 10/20/2021   FERRITIN 103 10/20/2021   Attestation Statements:   Reviewed by clinician on day of visit: allergies, medications, problem list, medical history, surgical history, family history, social history, and previous encounter notes.  I, Water quality scientist, CMA, am acting as transcriptionist for Briscoe Deutscher, DO  I have reviewed the above documentation for accuracy and completeness, and I agree with the above. -  Briscoe Deutscher, DO, MS, FAAFP, DABOM - Family and Bariatric Medicine.

## 2022-01-02 ENCOUNTER — Encounter: Payer: Self-pay | Admitting: Gastroenterology

## 2022-01-02 ENCOUNTER — Encounter (INDEPENDENT_AMBULATORY_CARE_PROVIDER_SITE_OTHER): Payer: Self-pay | Admitting: Family Medicine

## 2022-01-02 NOTE — Telephone Encounter (Signed)
Dr.Wallace °

## 2022-01-04 MED ORDER — NYSTATIN 100000 UNIT/GM EX OINT: 1.0000 "application " | TOPICAL_OINTMENT | Freq: Two times a day (BID) | CUTANEOUS | 0 refills | Status: DC | PRN

## 2022-01-05 ENCOUNTER — Encounter (INDEPENDENT_AMBULATORY_CARE_PROVIDER_SITE_OTHER): Payer: Self-pay | Admitting: Family Medicine

## 2022-01-05 DIAGNOSIS — R7301 Impaired fasting glucose: Secondary | ICD-10-CM

## 2022-01-06 ENCOUNTER — Encounter: Payer: Self-pay | Admitting: Hematology and Oncology

## 2022-01-06 ENCOUNTER — Telehealth: Payer: BC Managed Care – PPO | Admitting: Nurse Practitioner

## 2022-01-06 DIAGNOSIS — A09 Infectious gastroenteritis and colitis, unspecified: Secondary | ICD-10-CM | POA: Diagnosis not present

## 2022-01-06 DIAGNOSIS — R110 Nausea: Secondary | ICD-10-CM

## 2022-01-06 MED ORDER — ONDANSETRON HCL 4 MG PO TABS: 4.0000 mg | ORAL_TABLET | Freq: Three times a day (TID) | ORAL | 0 refills | Status: DC | PRN

## 2022-01-06 MED ORDER — AZITHROMYCIN 250 MG PO TABS: ORAL_TABLET | ORAL | 0 refills | Status: AC

## 2022-01-06 NOTE — Progress Notes (Signed)
Virtual Visit Consent   Katelyn Walker, you are scheduled for a virtual visit with a [facility] provider today.     Just as with appointments in the office, your consent must be obtained to participate.  Your consent will be active for this visit and any virtual visit you may have with one of our providers in the next 365 days.     If you have a MyChart account, a copy of this consent can be sent to you electronically.  All virtual visits are billed to your insurance company just like a traditional visit in the office.    As this is a virtual visit, video technology does not allow for your provider to perform a traditional examination.  This may limit your provider's ability to fully assess your condition.  If your provider identifies any concerns that need to be evaluated in person or the need to arrange testing (such as labs, EKG, etc.), we will make arrangements to do so.     Although advances in technology are sophisticated, we cannot ensure that it will always work on either your end or our end.  If the connection with a video visit is poor, the visit may have to be switched to a telephone visit.  With either a video or telephone visit, we are not always able to ensure that we have a secure connection.     I need to obtain your verbal consent now.   Are you willing to proceed with your visit today?    Katelyn Walker has provided verbal consent on 01/06/2022 for a virtual visit (video or telephone).   Viviano Simas, FNP   Date: 01/06/2022 7:53 AM   Virtual Visit via Video Note   I, Viviano Simas, connected with  Katelyn Walker  ([id number], [birth year]) on 01/06/22 at  8:15 AM EST by a video-enabled telemedicine application and verified that I am speaking with the correct person using two identifiers.  Location: Patient: Virtual Visit Location Patient: Home Provider: Virtual Visit Location Provider: Home Office   I discussed the limitations of evaluation and management by  telemedicine and the availability of in person appointments. The patient expressed understanding and agreed to proceed.    History of Present Illness: Katelyn Walker is a 51 y.o. who identifies as a female who was assigned female at birth, and is being seen today for nausea and diarrhea for the past 4 days. The day it started she was "clammy" but denies a fever.   The diarrhea is occurring throughout the night and wakes her up. She denies vomiting but she is nauseated. On average diarrhea is occurring every 2 hours even throughout the night.   She is hydrating and urinating.   She was started on [facility] over the past summer. She did have initial nausea but that has resolved and denies any diarrhea.   Possible exposure at work.   Denies any recent travel.    Problems:  Patient Active Problem List   Diagnosis Date Noted   Iron deficiency anemia 10/24/2021   Polyphagia 01/02/2021   Iron deficiency 01/02/2021   B12 deficiency 01/02/2021   Mixed hyperlipidemia 01/02/2021   Essential hypertension, Rx HCTZ for mild LE edema, off Diovan with weight loss 05/13/2019   Prediabetes, Rx Metformin 03/12/2019   Class 2 severe obesity with serious comorbidity and body mass index (BMI) of 35.0 to 35.9 in adult [facility]) 03/12/2019   Emotional eating, Rx Contrave, phentermine 03/12/2019   Vitamin D deficiency  05/14/2018   GERD (gastroesophageal reflux disease), with Hx of esophagitis, esophageal stricture, hiatal hernia, Rx Omeprazole 08/18/2017    Allergies: No Known Allergies  Medications:  Current Outpatient Medications:    buPROPion (WELLBUTRIN XL) 150 MG 24 hr tablet, Take 1 tablet (150 mg total) by mouth daily., Disp: 90 tablet, Rfl: 0   cyanocobalamin (,VITAMIN B-12,) 1000 MCG/ML injection, Inject 1 mL (1,000 mcg total) into the muscle once a week., Disp: 10 mL, Rfl: 0   mupirocin ointment (BACTROBAN) 2 %, Apply 1 application topically 2 (two) times daily., Disp: 22 g, Rfl: 0    Norgestimate-Eth Estradiol (ESTARYLLA PO), Take 28 mg by mouth., Disp: , Rfl:    nystatin ointment (MYCOSTATIN), Apply 1 application topically 2 (two) times daily as needed., Disp: 30 g, Rfl: 0   omeprazole (PRILOSEC) 40 MG capsule, Take 1 capsule (40 mg total) by mouth daily., Disp: 30 capsule, Rfl: 6   SYRINGE/NEEDLE, DISP, 1 ML (B-D 1CC SLIP TIP SYR 25GX5/8") 25G X 5/8" 1 ML MISC, Use to administer b12 injections, Disp: 50 each, Rfl: 0   tirzepatide (MOUNJARO) 10 MG/0.5ML Pen, Inject 10 mg into the skin once a week., Disp: 2 mL, Rfl: 1   traZODone (DESYREL) 50 MG tablet, Take 0.5-1 tablets (25-50 mg total) by mouth at bedtime as needed for sleep., Disp: 90 tablet, Rfl: 0   Vitamin D, Ergocalciferol, (DRISDOL) 1.25 MG (50000 UNIT) CAPS capsule, Take 1 capsule (50,000 Units total) by mouth every 7 (seven) days., Disp: 12 capsule, Rfl: 0  Observations/Objective: Patient is well-developed, well-nourished in no acute distress.  Resting comfortably at home.  Head is normocephalic, atraumatic.  No labored breathing.  Speech is clear and coherent with logical content.  Patient is alert and oriented at baseline.    Assessment and Plan: 1. Diarrhea of infectious origin  - azithromycin (ZITHROMAX) 250 MG tablet; Take 2 tablets on day 1, then 1 tablet daily on days 2 through 5  Dispense: 6 tablet; Refill: 0  2. Nausea  - ondansetron (ZOFRAN) 4 MG tablet; Take 1 tablet (4 mg total) by mouth every 8 (eight) hours as needed for nausea or vomiting.  Dispense: 20 tablet; Refill: 0    If symptoms persist without improvement throughout the weekend seek f/u with PCP to discuss stool samples as discussed  BRAT diet  Push fluids  Replace electrolytes with one Gatorade daily   Follow Up Instructions: I discussed the assessment and treatment plan with the patient. The patient was provided an opportunity to ask questions and all were answered. The patient agreed with the plan and demonstrated an  understanding of the instructions.  A copy of instructions were sent to the patient via MyChart unless otherwise noted below.    The patient was advised to call back or seek an in-person evaluation if the symptoms worsen or if the condition fails to improve as anticipated.  Time:  I spent 15 minutes with the patient via telehealth technology discussing the above problems/concerns.    Viviano Simas, FNP

## 2022-01-09 ENCOUNTER — Encounter: Payer: Self-pay | Admitting: Hematology and Oncology

## 2022-01-09 MED ORDER — TIRZEPATIDE 7.5 MG/0.5ML ~~LOC~~ SOAJ: 7.5000 mg | SUBCUTANEOUS | 0 refills | Status: DC

## 2022-01-09 MED ORDER — TIRZEPATIDE 10 MG/0.5ML ~~LOC~~ SOAJ: 10.0000 mg | SUBCUTANEOUS | 0 refills | Status: DC

## 2022-01-11 ENCOUNTER — Telehealth: Payer: Self-pay | Admitting: General Surgery

## 2022-01-11 NOTE — Telephone Encounter (Signed)
Left a voicemail for the patient to contact the office. Need to go over results of EM with pH. Would also need to schedule a follow up appointment in [place]

## 2022-01-11 NOTE — Telephone Encounter (Signed)
-----   Message from [facility] V, DO sent at 01/11/2022  8:22 AM EST ----- Esophageal Manometry and pH/impedance testing reviewed and notable for the following:  Esophageal Manometry: - Manometric evidence of hiatal hernia - Low resting EG junction pressure with complete relaxation - 80% failed swallows with absent peristalsis.  Only 20% of the swallows were normal and intact - Normal relaxation and inadequate augmentation of contractile phase following multiple rapid swallows - Complete bolus clearance on 2/10 swallows - Manometry consistent with ineffective esophageal motility with poor bolus clearance  pH/impedance (off PPI): 1.  Normal total esophageal acid exposure with percent time pH <4 of 3.3%.  [place] slightly elevated at 15.8 2.  Esophageal acid exposure abnormal in supine position 3.  Overall reflux events were elevated (60), predominantly acid reflux episodes 4.  Positive symptom correlation for heartburn based on SAP - Overall, pH/impedance testing consistent with gastroesophageal reflux, especially in supine position  Recommendations: - Continue acid suppression medications -Would recommend against antireflux surgery given ineffective esophageal motility - Continue antireflux lifestyle/dietary modifications to include avoid eating within 3 hours of bedtime, sleep with HOB elevated, avoid overeating, avoid exacerbating type foods - Continue cutting food into small pieces, chew thoroughly, and drink plenty of fluids with meals -Can schedule follow-up with me in the GI clinic

## 2022-01-11 NOTE — Telephone Encounter (Signed)
Patient called the office and we went over the recommendations after her EM. The patient verbalized understanding and has already had a consultation with Dr Andrey Campanile, the patient scheduled a follow up to come back and discuss cTIF on 01/26/2022

## 2022-01-15 ENCOUNTER — Encounter: Payer: Self-pay | Admitting: Hematology and Oncology

## 2022-01-16 ENCOUNTER — Encounter: Payer: Self-pay | Admitting: Hematology and Oncology

## 2022-01-16 ENCOUNTER — Other Ambulatory Visit: Payer: Self-pay

## 2022-01-16 ENCOUNTER — Other Ambulatory Visit: Payer: Self-pay | Admitting: Hematology

## 2022-01-16 ENCOUNTER — Inpatient Hospital Stay: Payer: BC Managed Care – PPO | Attending: Hematology

## 2022-01-16 DIAGNOSIS — D509 Iron deficiency anemia, unspecified: Secondary | ICD-10-CM | POA: Diagnosis not present

## 2022-01-16 DIAGNOSIS — E538 Deficiency of other specified B group vitamins: Secondary | ICD-10-CM | POA: Diagnosis not present

## 2022-01-16 DIAGNOSIS — D500 Iron deficiency anemia secondary to blood loss (chronic): Secondary | ICD-10-CM

## 2022-01-16 DIAGNOSIS — R1310 Dysphagia, unspecified: Secondary | ICD-10-CM

## 2022-01-16 DIAGNOSIS — R12 Heartburn: Secondary | ICD-10-CM

## 2022-01-16 LAB — CBC WITH DIFFERENTIAL (CANCER CENTER ONLY)
Abs Immature Granulocytes: 0.04 10*3/uL (ref 0.00–0.07)
Basophils Absolute: 0.1 10*3/uL (ref 0.0–0.1)
Basophils Relative: 1 %
Eosinophils Absolute: 0.6 10*3/uL — ABNORMAL HIGH (ref 0.0–0.5)
Eosinophils Relative: 6 %
HCT: 37.7 % (ref 36.0–46.0)
Hemoglobin: 12.7 g/dL (ref 12.0–15.0)
Immature Granulocytes: 0 %
Lymphocytes Relative: 18 %
Lymphs Abs: 1.7 10*3/uL (ref 0.7–4.0)
MCH: 27.3 pg (ref 26.0–34.0)
MCHC: 33.7 g/dL (ref 30.0–36.0)
MCV: 81.1 fL (ref 80.0–100.0)
Monocytes Absolute: 0.4 10*3/uL (ref 0.1–1.0)
Monocytes Relative: 4 %
Neutro Abs: 6.7 10*3/uL (ref 1.7–7.7)
Neutrophils Relative %: 71 %
Platelet Count: 247 10*3/uL (ref 150–400)
RBC: 4.65 MIL/uL (ref 3.87–5.11)
RDW: 13.2 % (ref 11.5–15.5)
WBC Count: 9.4 10*3/uL (ref 4.0–10.5)
nRBC: 0 % (ref 0.0–0.2)

## 2022-01-16 LAB — VITAMIN B12: Vitamin B-12: 424 pg/mL (ref 180–914)

## 2022-01-16 LAB — FERRITIN: Ferritin: 102 ng/mL (ref 11–307)

## 2022-01-17 ENCOUNTER — Encounter: Payer: Self-pay | Admitting: Hematology and Oncology

## 2022-01-17 LAB — INTRINSIC FACTOR ANTIBODIES: Intrinsic Factor: 1 AU/mL (ref 0.0–1.1)

## 2022-01-18 ENCOUNTER — Encounter: Payer: Self-pay | Admitting: Hematology and Oncology

## 2022-01-19 ENCOUNTER — Encounter: Payer: Self-pay | Admitting: Hematology and Oncology

## 2022-01-20 ENCOUNTER — Encounter: Payer: Self-pay | Admitting: Hematology and Oncology

## 2022-01-21 ENCOUNTER — Encounter: Payer: Self-pay | Admitting: Hematology and Oncology

## 2022-01-22 ENCOUNTER — Ambulatory Visit (INDEPENDENT_AMBULATORY_CARE_PROVIDER_SITE_OTHER): Payer: BC Managed Care – PPO | Admitting: Family Medicine

## 2022-01-22 ENCOUNTER — Encounter (INDEPENDENT_AMBULATORY_CARE_PROVIDER_SITE_OTHER): Payer: Self-pay | Admitting: Family Medicine

## 2022-01-22 ENCOUNTER — Other Ambulatory Visit: Payer: Self-pay

## 2022-01-22 ENCOUNTER — Encounter: Payer: Self-pay | Admitting: Hematology and Oncology

## 2022-01-22 VITALS — BP 130/83 | HR 71 | Temp 98.2°F | Ht 67.0 in | Wt 199.0 lb

## 2022-01-22 DIAGNOSIS — E538 Deficiency of other specified B group vitamins: Secondary | ICD-10-CM | POA: Diagnosis not present

## 2022-01-22 DIAGNOSIS — Z6831 Body mass index (BMI) 31.0-31.9, adult: Secondary | ICD-10-CM

## 2022-01-22 DIAGNOSIS — E611 Iron deficiency: Secondary | ICD-10-CM | POA: Diagnosis not present

## 2022-01-22 DIAGNOSIS — R7301 Impaired fasting glucose: Secondary | ICD-10-CM

## 2022-01-22 DIAGNOSIS — Z6837 Body mass index (BMI) 37.0-37.9, adult: Secondary | ICD-10-CM

## 2022-01-22 DIAGNOSIS — E669 Obesity, unspecified: Secondary | ICD-10-CM | POA: Diagnosis not present

## 2022-01-22 MED ORDER — TIRZEPATIDE 7.5 MG/0.5ML ~~LOC~~ SOAJ: 7.5000 mg | SUBCUTANEOUS | 0 refills | Status: DC

## 2022-01-22 MED ORDER — TIRZEPATIDE 12.5 MG/0.5ML ~~LOC~~ SOAJ: 12.5000 mg | SUBCUTANEOUS | 0 refills | Status: DC

## 2022-01-22 MED ORDER — TIRZEPATIDE 10 MG/0.5ML ~~LOC~~ SOAJ: 10.0000 mg | SUBCUTANEOUS | 0 refills | Status: DC

## 2022-01-23 ENCOUNTER — Inpatient Hospital Stay: Payer: BC Managed Care – PPO | Attending: Hematology

## 2022-01-23 ENCOUNTER — Inpatient Hospital Stay: Payer: BC Managed Care – PPO | Admitting: Hematology and Oncology

## 2022-01-23 ENCOUNTER — Encounter: Payer: Self-pay | Admitting: Hematology and Oncology

## 2022-01-23 ENCOUNTER — Encounter (INDEPENDENT_AMBULATORY_CARE_PROVIDER_SITE_OTHER): Payer: Self-pay

## 2022-01-23 DIAGNOSIS — E538 Deficiency of other specified B group vitamins: Secondary | ICD-10-CM | POA: Insufficient documentation

## 2022-01-23 DIAGNOSIS — E611 Iron deficiency: Secondary | ICD-10-CM

## 2022-01-23 DIAGNOSIS — E559 Vitamin D deficiency, unspecified: Secondary | ICD-10-CM | POA: Insufficient documentation

## 2022-01-23 DIAGNOSIS — Z79899 Other long term (current) drug therapy: Secondary | ICD-10-CM | POA: Diagnosis not present

## 2022-01-23 DIAGNOSIS — D509 Iron deficiency anemia, unspecified: Secondary | ICD-10-CM | POA: Insufficient documentation

## 2022-01-23 DIAGNOSIS — R7301 Impaired fasting glucose: Secondary | ICD-10-CM

## 2022-01-23 MED ORDER — "BD SYRINGE/NEEDLE SLIP TIP 25G X 5/8"" 1 ML MISC": 0 refills | Status: DC

## 2022-01-23 MED ORDER — CYANOCOBALAMIN 1000 MCG/ML IJ SOLN: 1000.0000 ug | INTRAMUSCULAR | 0 refills | Status: DC

## 2022-01-23 MED ORDER — CYANOCOBALAMIN 1000 MCG/ML IJ SOLN
1000.0000 ug | Freq: Once | INTRAMUSCULAR | Status: AC
  Administered 2022-01-23: 1000 ug via INTRAMUSCULAR
  Filled 2022-01-23: qty 1

## 2022-01-23 NOTE — Progress Notes (Signed)
[place]   [phone] [phone]   Clinic Follow up Note   Patient Care Team: Harlan Stains, MD as PCP - General (Family Medicine)  Date of Service:  01/23/2022  CHIEF COMPLAINT: f/u of iron deficiency anemia  CURRENT THERAPY:  IV iron as needed, last 03/2021  ASSESSMENT & PLAN:  Katelyn Walker is a 51 y.o. female with   1. Iron Deficiency Anemia -presented with fatigue and lightheadedness. Lab work 01/2021 showed low iron and ferritin levels, hgb WNL. Initially referred to Dr. Chryl Heck 02/2021 -she tried oral iron but had no significant improvement. -last colonoscopy in 2019 was benign with some internal hemorrhoids. She is on birth control, periods are regular and not heavy. Early Chars disease work up was negative, I still suspect she has iron absorption issue likely from hiatal hernia  -she received 5 doses of IV venofer in 03/2021.  -endoscopy 09/21/21 with Dr. Bryan Lemma showed hiatal hernia, gastric biopsies showed only mild chronic inflammation. -Most recent labs with normal Hb and Iron profile  2. Vitamin B12 and D deficiencies -On B12 injection weekly, most recent B12 normal.  PLAN: -B12 injection today -continue B12 injections at home once a weekX7 then monthly, I called in today. -f/u with Dr. Chryl Heck and B12 injection in 3 months, with lab a week before   INTERVAL HISTORY:   Katelyn Walker is here for a follow up of anemia. She was last seen by Dr. Burr Medico. She has been working on weight, lost 35 lbs.  She had esophageal manometry done recently, will be seeing her gastroenterologist soon. She is seeing them for a hiatal hernia. She has been taking B12 once a week. She feels so much better overall.   All other systems were reviewed with the patient and are negative.  MEDICAL HISTORY:  Past Medical History:  Diagnosis Date   Back pain    Dyspnea    Elevated blood pressure reading without diagnosis of hypertension    Esophageal  ulcer    GERD (gastroesophageal reflux disease)    Hot flashes    Hypothyroid    Joint pain    Sciatica, left side    Vitamin B 12 deficiency    Vitamin D deficiency     SURGICAL HISTORY: Past Surgical History:  Procedure Laterality Date   65 HOUR [place] STUDY N/A 12/13/2021   Procedure: 24 HOUR [place] STUDY;  Surgeon: Lavena Bullion, DO;  Location: [facility] END[facility];  Service: Endoscopy;  Laterality: N/A;   ANKLE FRACTURE SURGERY Left 07/18/2021   DILATION AND CURETTAGE OF UTERUS     2011    ESOPHAGEAL MANOMETRY N/A 12/13/2021   Procedure: ESOPHAGEAL MANOMETRY (EM);  Surgeon: Lavena Bullion, DO;  Location: [facility] ENDOSCOPY;  Service: Endoscopy;  Laterality: N/A;   OVARIAN CYST SURGERY     2009, 2013   [facility] IMPEDANCE STUDY N/A 12/13/2021   Procedure: [place] IMPEDANCE STUDY;  Surgeon: Lavena Bullion, DO;  Location: [facility] ENDOSCOPY;  Service: Endoscopy;  Laterality: N/A;    I have reviewed the social history and family history with the patient and they are unchanged from previous note.  ALLERGIES:  has No Known Allergies.  MEDICATIONS:  Current Outpatient Medications  Medication Sig Dispense Refill   buPROPion (WELLBUTRIN XL) 150 MG 24 hr tablet Take 1 tablet (150 mg total) by mouth daily. 90 tablet 0   cyanocobalamin (,VITAMIN B-12,) 1000 MCG/ML injection Inject 1 mL (1,000 mcg total) into the muscle once a week. 10 mL  0   mupirocin ointment (BACTROBAN) 2 % Apply 1 application topically 2 (two) times daily. 22 g 0   Norgestimate-Eth Estradiol (ESTARYLLA PO) Take 28 mg by mouth.     nystatin ointment (MYCOSTATIN) Apply 1 application topically 2 (two) times daily as needed. 30 g 0   omeprazole (PRILOSEC) 40 MG capsule Take 1 capsule (40 mg total) by mouth daily. 30 capsule 6   ondansetron (ZOFRAN) 4 MG tablet Take 1 tablet (4 mg total) by mouth every 8 (eight) hours as needed for nausea or vomiting. 20 tablet 0   SYRINGE/NEEDLE, DISP, 1 ML (B-D 1CC SLIP TIP SYR 25GX5/8") 25G X 5/8" 1 ML MISC  Use to administer b12 injections 50 each 0   tirzepatide (MOUNJARO) 10 MG/0.5ML Pen Inject 10 mg into the skin once a week. 2 mL 0   tirzepatide (MOUNJARO) 12.5 MG/0.5ML Pen Inject 12.5 mg into the skin once a week. 2 mL 0   tirzepatide (MOUNJARO) 7.5 MG/0.5ML Pen Inject 7.5 mg into the skin once a week. 2 mL 0   traZODone (DESYREL) 50 MG tablet Take 0.5-1 tablets (25-50 mg total) by mouth at bedtime as needed for sleep. 90 tablet 0   Vitamin D, Ergocalciferol, (DRISDOL) 1.25 MG (50000 UNIT) CAPS capsule Take 1 capsule (50,000 Units total) by mouth every 7 (seven) days. 12 capsule 0   No current facility-administered medications for this visit.    PHYSICAL EXAMINATION: ECOG PERFORMANCE STATUS: 1 - Symptomatic but completely ambulatory  There were no vitals filed for this visit.  Wt Readings from Last 3 Encounters:  01/22/22 199 lb (90.3 kg)  12/25/21 204 lb (92.5 kg)  11/27/21 209 lb (94.8 kg)     GENERAL:alert, no distress and comfortable SKIN: skin color normal, no rashes or significant lesions Neck: No palpable cervical or axillary adenopathy. EYES: normal, Conjunctiva are pink and non-injected, sclera clear  Chest: CTA bilaterally. Heart: RRR NEURO: alert & oriented x 3 with fluent speech  LABORATORY DATA:  I have reviewed the data as listed CBC Latest Ref Rng & Units 01/16/2022 10/20/2021 10/02/2021  WBC 4.0 - 10.5 K/uL 9.4 7.9 7.0  Hemoglobin 12.0 - 15.0 g/dL 12.7 13.5 14.2  Hematocrit 36.0 - 46.0 % 37.7 39.8 43.8  Platelets 150 - 400 K/uL 247 257 272     CMP Latest Ref Rng & Units 10/02/2021 01/30/2021 08/31/2020  Glucose 70 - 99 mg/dL 85 87 109(H)  BUN 6 - 24 mg/dL 7 12 10   Creatinine 0.57 - 1.00 mg/dL 0.92 0.85 0.98  Sodium 134 - 144 mmol/L 140 138 141  Potassium 3.5 - 5.2 mmol/L 3.9 4.5 4.0  Chloride 96 - 106 mmol/L 98 100 101  CO2 20 - 29 mmol/L 24 23 24   Calcium 8.7 - 10.2 mg/dL 8.7 8.9 9.0  Total Protein 6.0 - 8.5 g/dL 6.8 6.4 7.1  Total Bilirubin 0.0 - 1.2  mg/dL 0.3 <0.2 <0.2  Alkaline Phos 44 - 121 IU/L 96 90 97  AST 0 - 40 IU/L 13 13 12   ALT 0 - 32 IU/L 12 8 6    Age appropriate screening recommended. Labs reviewed. CBC showed white blood cell count of 9.4, hemoglobin of 12.7 and platelet count of 247 B12 of 424 Ferritin of 102 Intrinsic factor, normal   RADIOGRAPHIC STUDIES: I have personally reviewed the radiological images as listed and agreed with the findings in the report. No results found.    No orders of the defined types were placed in this encounter.  I have reviewed the above documentation for accuracy and completeness, and I agree with the above.   Benay Pike MD

## 2022-01-23 NOTE — Progress Notes (Signed)
Chief Complaint:   OBESITY Katelyn Walker is here to discuss her progress with her obesity treatment plan along with follow-up of her obesity related diagnoses. See Medical Weight Management Flowsheet for complete bioelectrical impedance results.  Today's visit was #: 59 Starting weight: 242 lbs Starting date: 05/14/2018 Weight change since last visit: 5 lbs Total lbs lost to date: 43 lbs Total weight loss percentage to date: -17.77%  Nutrition Plan: Keeping a food journal and adhering to recommended goals of 1200 calories and 95 grams of protein daily for 95% of the time. Activity: Swimming for 30 minutes 2 times per week.  Interim History: Katelyn Walker's B12 level has improved to 424.  She had her last iron infusion months ago.  Her ferritin is 102.  Vitamin D level is 76.61, so she will decrease her vitamin D to 50,000 IU every 2 weeks.  She had esophageal manometry.  Results - will proceed with Dr. Andrey Campanile.  Assessment/Plan:   1. Impaired fasting glucose, with polyphagia Current treatment: She was unable to get Mounjaro 12.5 mg.    Plan: Will call ahead to the pharmacy to see which of the below doses of Mounjaro they currently have in stock.  She will continue to focus on protein-rich, low simple carbohydrate foods. We reviewed the importance of hydration, regular exercise for stress reduction, and restorative sleep.  - Refill tirzepatide (MOUNJARO) 7.5 MG/0.5ML Pen; Inject 7.5 mg into the skin once a week.  Dispense: 2 mL; Refill: 0 - Refill tirzepatide (MOUNJARO) 10 MG/0.5ML Pen; Inject 10 mg into the skin once a week.  Dispense: 2 mL; Refill: 0 - Refill tirzepatide (MOUNJARO) 12.5 MG/0.5ML Pen; Inject 12.5 mg into the skin once a week.  Dispense: 2 mL; Refill: 0  2. B12 deficiency Lab Results  Component Value Date   VITAMINB12 424 01/16/2022      Supplementation: Vitamin B12 injection every 30 days.  Plan:  Continue current treatment.     3. Iron deficiency Her last iron  infusion was some time ago.  Her ferritin level is 102.  Nutrition: Iron-rich foods include dark leafy greens, red and white meats, eggs, seafood, and beans.  Certain foods and drinks prevent your body from absorbing iron properly. Avoid eating these foods in the same meal as iron-rich foods or with iron supplements. These foods include: coffee, black tea, and red wine; milk, dairy products, and foods that are high in calcium; beans and soybeans; whole grains. Constipation can be a side effect of iron supplementation. Increased water and fiber intake are helpful. Water goal: > 2 liters/day. Fiber goal: > 25 grams/day.  4. Obesity, current BMI 31.3  Course: Katelyn Walker is currently in the action stage of change. As such, her goal is to continue with weight loss efforts.   Nutrition goals: She has agreed to keeping a food journal and adhering to recommended goals of 1200 calories and 95 grams of protein.   Exercise goals:  As is.  Behavioral modification strategies: increasing lean protein intake, decreasing simple carbohydrates, increasing vegetables, and increasing water intake.  Katelyn Walker has agreed to follow-up with our clinic in 4 weeks. She was informed of the importance of frequent follow-up visits to maximize her success with intensive lifestyle modifications for her multiple health conditions.   Objective:   Blood pressure 130/83, pulse 71, temperature 98.2 F (36.8 C), temperature source Oral, height 5\' 7"  (1.702 m), weight 199 lb (90.3 kg), SpO2 96 %. Body mass index is 31.17 kg/m.  General: Cooperative,  alert, well developed, in no acute distress. HEENT: Conjunctivae and lids unremarkable. Cardiovascular: Regular rhythm.  Lungs: Normal work of breathing. Neurologic: No focal deficits.   Lab Results  Component Value Date   CREATININE 0.92 10/02/2021   BUN 7 10/02/2021   NA 140 10/02/2021   K 3.9 10/02/2021   CL 98 10/02/2021   CO2 24 10/02/2021   Lab Results  Component Value  Date   ALT 12 10/02/2021   AST 13 10/02/2021   ALKPHOS 96 10/02/2021   BILITOT 0.3 10/02/2021   Lab Results  Component Value Date   HGBA1C 5.6 01/30/2021   HGBA1C 5.7 (H) 06/29/2020   HGBA1C 5.6 01/04/2020   HGBA1C 5.6 09/14/2019   HGBA1C 5.8 (H) 03/19/2019   Lab Results  Component Value Date   INSULIN 17.2 10/02/2021   INSULIN 23.7 01/30/2021   INSULIN 21.6 01/04/2020   INSULIN 14.9 09/14/2019   INSULIN 17.7 03/19/2019   Lab Results  Component Value Date   TSH 4.080 01/04/2020   Lab Results  Component Value Date   CHOL 203 (H) 10/02/2021   HDL 51 10/02/2021   LDLCALC 126 (H) 10/02/2021   TRIG 144 10/02/2021   CHOLHDL 3.8 01/30/2021   Lab Results  Component Value Date   VD25OH 76.61 10/20/2021   VD25OH 37.4 10/02/2021   VD25OH 47.0 01/30/2021   Lab Results  Component Value Date   WBC 9.4 01/16/2022   HGB 12.7 01/16/2022   HCT 37.7 01/16/2022   MCV 81.1 01/16/2022   PLT 247 01/16/2022   Lab Results  Component Value Date   IRON 52 10/20/2021   TIBC 270 10/20/2021   FERRITIN 102 01/16/2022   Attestation Statements:   Reviewed by clinician on day of visit: allergies, medications, problem list, medical history, surgical history, family history, social history, and previous encounter notes.  I, Insurance claims handler, CMA, am acting as transcriptionist for Helane Rima, DO  I have reviewed the above documentation for accuracy and completeness, and I agree with the above. -  Helane Rima, DO, MS, FAAFP, DABOM - Family and Bariatric Medicine.

## 2022-01-24 ENCOUNTER — Encounter: Payer: Self-pay | Admitting: Hematology and Oncology

## 2022-01-26 ENCOUNTER — Encounter: Payer: Self-pay | Admitting: Gastroenterology

## 2022-01-26 ENCOUNTER — Ambulatory Visit: Payer: BC Managed Care – PPO | Admitting: Gastroenterology

## 2022-01-26 ENCOUNTER — Other Ambulatory Visit: Payer: Self-pay

## 2022-01-26 VITALS — BP 128/82 | HR 85 | Ht 67.0 in | Wt 203.0 lb

## 2022-01-26 DIAGNOSIS — Z86010 Personal history of colonic polyps: Secondary | ICD-10-CM | POA: Diagnosis not present

## 2022-01-26 DIAGNOSIS — K219 Gastro-esophageal reflux disease without esophagitis: Secondary | ICD-10-CM | POA: Diagnosis not present

## 2022-01-26 DIAGNOSIS — K224 Dyskinesia of esophagus: Secondary | ICD-10-CM | POA: Diagnosis not present

## 2022-01-26 DIAGNOSIS — K449 Diaphragmatic hernia without obstruction or gangrene: Secondary | ICD-10-CM | POA: Diagnosis not present

## 2022-01-26 MED ORDER — OMEPRAZOLE 40 MG PO CPDR
40.0000 mg | DELAYED_RELEASE_CAPSULE | Freq: Every day | ORAL | 5 refills | Status: DC
Start: 2022-01-26 — End: 2023-02-04

## 2022-01-26 NOTE — Progress Notes (Signed)
Chief Complaint:    GERD, hiatal hernia  GI History: 52 year old female with history of hypothyroid, vitamin D deficiency, iron deficiency s/p IV iron infusions, follows in the GI clinic for reflux and dysphagia.  Was initially seen by me on 05/23/2021.  Previously followed with Dr. Earlean Shawl and prior to that with Dr. Oletta Lamas at [place]: - Reflux started in 2008 during pregnancy.  Index symptoms of heartburn, regurgitation - 2010: Developed intermittent solid food dysphagia.  Was seen by Dr. Oletta Lamas at [place].  Started omeprazole 20 mg/day with overall improvement - Esophagram 09/2009: Small HH, mild ring at GE junction - 2015: Omeprazole changed to Zantac due to PPI concerns, with immediate breakthrough and eventually changed back to PPI. - EGD (12/2015, Dr. Earlean Shawl): 6 cm hiatal hernia, esophageal stricture dilated with 17 mm Savary.  Inflammatory changes on esophageal stricture biopsies.  LA Grade D esophagitis.  Started omeprazole 40 mg/day - EGD 02/2016 (Dr. Earlean Shawl): Resolved erosive esophagitis, large 5 cm hiatal hernia, nonobstructing distal esophageal stricture.  Continued omeprazole 40 mg/day with relief of reflux sxs then tapered to 20 mg/day in 2018, but breakthrough at 20 mg/day -Colonoscopy 08/2018.  Internal hemorrhoids, diverticulosis, sigmoid polyp (benign path), rectal polyp x2 (tubular adenomas).  5-year repeat per patient - Hemorrhoid banding recommended in 2019 by Dr. Earlean Shawl, but postponed d/t Covid concerns  -05/23/2021: Evaluated in the GI clinic for intermittent solid food dysphagia and increasing reflux symptoms.  Index reflux symptoms of increased throat clearing, and nausea, chronic dry cough.  Takes omeprazole 40 mg/day and Tums prn breakthrough.  As breakthrough with any missed dose of omeprazole.  Sleeps with HOB elevated. - 05/2021: Normal/negative celiac panel - 09/21/2021: EGD:5 cm HH, normal Z-line, mild Schatzki's ring dilated with 20 mm Savary then fractured with  forceps.  Benign fundic gland polyps, normal duodenum -10/20/2021: Evaluation by Dr. Redmond Pulling.   - 12/13/2021: Esophageal Manometry: Ineffective esophageal motility.  80% failed swallows with absent peristalsis.  Only 20% of swallows normal/intact.  Normal relaxation and inadequate augmentation of contractile phase following multiple rapid swallows with 20% bolus clearance.  Hiatal hernia noted. - 12/05/2021: pH/impedance (off PPI): Normal esophageal acid exposure with percent time pH <4 of 3.3%.  De[place]eester slightly elevated at 15.8.  Esophageal acid exposure abnormal in supine with elevated number of reflux events (60) and positive symptom correlation for heartburn based on SAP. - Recommended against antireflux surgery given ineffective esophageal motility   Maternal uncle with Esophageal CA. Father with colon polyps. No known family history of CRC, liver disease, pancreatic disease, or IBD   HPI:     Patient is a 51 y.o. female presenting to the Gastroenterology Clinic for follow-up.   She has been following a Healthy Weight and Wellness with significant, intentional weight loss.  BMI now 31.8.  Continues to follow in the Hematology clinic for evaluation/treatment of IDA and B12 deficiency.  Last seen on 01/23/2022.  Currently on weekly B12 injections with plan for 65-month follow-up.  Reflux otherwise largely well controlled with Prilosec 40 mg/day.  No current dysphagia.  Otherwise no complaints or active issues today.  CBC Latest Ref Rng & Units 01/16/2022 10/20/2021 10/02/2021  WBC 4.0 - 10.5 K/uL 9.4 7.9 7.0  Hemoglobin 12.0 - 15.0 g/dL 12.7 13.5 14.2  Hematocrit 36.0 - 46.0 % 37.7 39.8 43.8  Platelets 150 - 400 K/uL 247 257 272     Review of systems:     No chest pain, no SOB, no fevers, no urinary  sx   Past Medical History:  Diagnosis Date   Back pain    Dyspnea    Elevated blood pressure reading without diagnosis of hypertension    Esophageal ulcer    GERD (gastroesophageal  reflux disease)    Hot flashes    Hypothyroid    Joint pain    Sciatica, left side    Vitamin B 12 deficiency    Vitamin D deficiency     Patient's surgical history, family medical history, social history, medications and allergies were all reviewed in Epic    Current Outpatient Medications  Medication Sig Dispense Refill   buPROPion (WELLBUTRIN XL) 150 MG 24 hr tablet Take 1 tablet (150 mg total) by mouth daily. 90 tablet 0   cyanocobalamin (,VITAMIN B-12,) 1000 MCG/ML injection Inject 1 mL (1,000 mcg total) into the muscle every 30 (thirty) days. 10 mL 0   mupirocin ointment (BACTROBAN) 2 % Apply 1 application topically 2 (two) times daily. 22 g 0   Norgestimate-Eth Estradiol (ESTARYLLA PO) Take 28 mg by mouth.     nystatin ointment (MYCOSTATIN) Apply 1 application topically 2 (two) times daily as needed. 30 g 0   omeprazole (PRILOSEC) 40 MG capsule Take 1 capsule (40 mg total) by mouth daily. 30 capsule 6   ondansetron (ZOFRAN) 4 MG tablet Take 1 tablet (4 mg total) by mouth every 8 (eight) hours as needed for nausea or vomiting. 20 tablet 0   SYRINGE/NEEDLE, DISP, 1 ML (B-D 1CC SLIP TIP SYR 25GX5/8") 25G X 5/8" 1 ML MISC Use to administer b12 injections 50 each 0   tirzepatide (MOUNJARO) 10 MG/0.5ML Pen Inject 10 mg into the skin once a week. 2 mL 0   tirzepatide (MOUNJARO) 12.5 MG/0.5ML Pen Inject 12.5 mg into the skin once a week. 2 mL 0   tirzepatide (MOUNJARO) 7.5 MG/0.5ML Pen Inject 7.5 mg into the skin once a week. 2 mL 0   traZODone (DESYREL) 50 MG tablet Take 0.5-1 tablets (25-50 mg total) by mouth at bedtime as needed for sleep. 90 tablet 0   Vitamin D, Ergocalciferol, (DRISDOL) 1.25 MG (50000 UNIT) CAPS capsule Take 1 capsule (50,000 Units total) by mouth every 7 (seven) days. 12 capsule 0   No current facility-administered medications for this visit.    Physical Exam:     There were no vitals taken for this visit.  GENERAL:  Pleasant female in NAD PSYCH: :  Cooperative, normal affect Musculoskeletal:  Normal muscle tone, normal strength NEURO: Alert and oriented x 3, no focal neurologic deficits   IMPRESSION and PLAN:    1) GERD 2) Hiatal hernia 3) Esophageal dysmotility  Esophageal Manometry with ineffective esophageal motility and poor bolus clearance.  We discussed that finding at length today.  I recommend against antireflux surgery given ineffective esophageal motility.  Her reflux symptoms are otherwise largely well controlled on current therapy.  - Placed refill today for omeprazole 40 mg/day - Renal function recently checked and normal.  Yearly BMP check - As above, no plan for antireflux surgery at this juncture - HOB elevation, avoid eating close to bedtime - Eat in upright position - Continue antireflux lifestyle/dietary modifications with avoidance of exacerbating type foods  4) History of colon polyps - Repeat colonoscopy in 2024 for ongoing polyp surveillance  - RTC in 12 months or sooner prn  I spent 35 minutes of time, including in depth chart review, independent review of results as outlined above, communicating results with the patient directly, face-to-face time  with the patient, coordinating care, and ordering studies and medications as appropriate, and documentation.          Lavena Bullion ,DO, FACG 01/26/2022, 8:23 AM

## 2022-01-26 NOTE — Patient Instructions (Addendum)
If you are age 52 or older, your body mass index should be between 23-30. Your Body mass index is 31.79 kg/m. If this is out of the aforementioned range listed, please consider follow up with your Primary Care Provider.  If you are age 44 or younger, your body mass index should be between 19-25. Your Body mass index is 31.79 kg/m. If this is out of the aformentioned range listed, please consider follow up with your Primary Care Provider.   __________________________________________________________  The [facility] providers would like to encourage you to use [facility] to communicate with providers for non-urgent requests or questions.  Due to long hold times on the telephone, sending your provider a message by [facility] may be a faster and more efficient way to get a response.  Please allow 48 business hours for a response.  Please remember that this is for non-urgent requests.   Due to recent changes in healthcare laws, you may see the results of your imaging and laboratory studies on MyChart before your provider has had a chance to review them.  We understand that in some cases there may be results that are confusing or concerning to you. Not all laboratory results come back in the same time frame and the provider may be waiting for multiple results in order to interpret others.  Please give [name] 48 hours in order for your provider to thoroughly review all the results before contacting the office for clarification of your results.   Follow up in 1 year. Please call to schedule in February 2024. Our number is [phone] Thank you for choosing me and [facility].  Vito Cirigliano, D.O.

## 2022-01-30 ENCOUNTER — Encounter (INDEPENDENT_AMBULATORY_CARE_PROVIDER_SITE_OTHER): Payer: Self-pay

## 2022-02-06 ENCOUNTER — Other Ambulatory Visit: Payer: Self-pay | Admitting: Gastroenterology

## 2022-02-12 ENCOUNTER — Encounter: Payer: Self-pay | Admitting: Hematology and Oncology

## 2022-02-13 ENCOUNTER — Encounter: Payer: Self-pay | Admitting: Hematology and Oncology

## 2022-02-20 ENCOUNTER — Encounter (INDEPENDENT_AMBULATORY_CARE_PROVIDER_SITE_OTHER): Payer: Self-pay

## 2022-02-20 ENCOUNTER — Encounter (INDEPENDENT_AMBULATORY_CARE_PROVIDER_SITE_OTHER): Payer: Self-pay | Admitting: Physician Assistant

## 2022-02-20 ENCOUNTER — Ambulatory Visit (INDEPENDENT_AMBULATORY_CARE_PROVIDER_SITE_OTHER): Payer: BC Managed Care – PPO | Admitting: Family Medicine

## 2022-02-21 ENCOUNTER — Encounter: Payer: Self-pay | Admitting: Hematology and Oncology

## 2022-02-22 ENCOUNTER — Ambulatory Visit (INDEPENDENT_AMBULATORY_CARE_PROVIDER_SITE_OTHER): Payer: BC Managed Care – PPO | Admitting: Physician Assistant

## 2022-02-26 MED ORDER — TIRZEPATIDE 10 MG/0.5ML ~~LOC~~ SOAJ: 10.0000 mg | SUBCUTANEOUS | 5 refills | Status: DC

## 2022-02-27 ENCOUNTER — Ambulatory Visit (INDEPENDENT_AMBULATORY_CARE_PROVIDER_SITE_OTHER): Payer: BC Managed Care – PPO | Admitting: Physician Assistant

## 2022-02-27 ENCOUNTER — Encounter (INDEPENDENT_AMBULATORY_CARE_PROVIDER_SITE_OTHER): Payer: Self-pay | Admitting: Physician Assistant

## 2022-02-27 ENCOUNTER — Other Ambulatory Visit: Payer: Self-pay

## 2022-02-27 VITALS — BP 134/86 | HR 88 | Temp 98.0°F | Ht 67.0 in | Wt 197.0 lb

## 2022-02-27 DIAGNOSIS — E669 Obesity, unspecified: Secondary | ICD-10-CM | POA: Diagnosis not present

## 2022-02-27 DIAGNOSIS — F3289 Other specified depressive episodes: Secondary | ICD-10-CM

## 2022-02-27 DIAGNOSIS — E559 Vitamin D deficiency, unspecified: Secondary | ICD-10-CM | POA: Diagnosis not present

## 2022-02-27 DIAGNOSIS — Z9189 Other specified personal risk factors, not elsewhere classified: Secondary | ICD-10-CM | POA: Diagnosis not present

## 2022-02-27 DIAGNOSIS — E66812 Obesity, class 2: Secondary | ICD-10-CM

## 2022-02-27 DIAGNOSIS — Z6830 Body mass index (BMI) 30.0-30.9, adult: Secondary | ICD-10-CM | POA: Diagnosis not present

## 2022-02-27 MED ORDER — VITAMIN D (ERGOCALCIFEROL) 1.25 MG (50000 UNIT) PO CAPS: 50000.0000 [IU] | ORAL_CAPSULE | ORAL | 0 refills | Status: DC

## 2022-02-28 ENCOUNTER — Telehealth (INDEPENDENT_AMBULATORY_CARE_PROVIDER_SITE_OTHER): Payer: Self-pay | Admitting: Family Medicine

## 2022-02-28 ENCOUNTER — Encounter (INDEPENDENT_AMBULATORY_CARE_PROVIDER_SITE_OTHER): Payer: Self-pay

## 2022-02-28 ENCOUNTER — Encounter (INDEPENDENT_AMBULATORY_CARE_PROVIDER_SITE_OTHER): Payer: Self-pay | Admitting: Family Medicine

## 2022-02-28 NOTE — Telephone Encounter (Signed)
Prior authorization denied for Mounjaro. Patient already uses copay card. Patient notified via mychart.  °

## 2022-02-28 NOTE — Progress Notes (Signed)
? ? ? ?Chief Complaint:  ? ?OBESITY ?Katelyn Walker is here to discuss her progress with her obesity treatment plan along with follow-up of her obesity related diagnoses. Katelyn Walker is on keeping a food journal and adhering to recommended goals of 1200 calories and 95 grams of protein and states she is following her eating plan approximately 95% of the time. Katelyn Walker states she is swimming for 45 minutes 1 times per week. ? ?Today's visit was #: 60 ?Starting weight: 242 lbs ?Starting date: 05/14/2018 ?Today's weight: 197 lbs ?Today's date: 02/27/2022 ?Total lbs lost to date: 45 lbs ?Total lbs lost since last in-office visit: 2 lbs ? ?Interim History: Katelyn Walker reports that Greggory Keen has decreased the "food noise" in her head and decreased her appetite. She is hitting her calorie goal but struggling with her protein, averaging 70-80 grams of protein daily.  ? ?Subjective:  ? ?1. Vitamin D deficiency ?Katelyn Walker is currently on Vitamin D every other week. Her last Vitamin D level was 76.61. ? ?2. Other depression, with emotional eating ?Katelyn Walker is on bupropion 150 mg. She is unsure if it helps with cravings so she want to try to discontinue for now.  ? ?3. At risk for osteoporosis ?Katelyn Walker is at higher risk of osteopenia and osteoporosis due to Vitamin D deficiency.   ? ?Assessment/Plan:  ? ?1. Vitamin D deficiency ?Low Vitamin D level contributes to fatigue and are associated with obesity, breast, and colon cancer. We will refill prescription Vitamin D 50,000 IU every other week for 1 month with no refills and Katelyn Walker will follow-up for routine testing of Vitamin D, at least 2-3 times per year to avoid over-replacement. ? ?- Vitamin D, Ergocalciferol, (DRISDOL) 1.25 MG (50000 UNIT) CAPS capsule; Take 1 capsule (50,000 Units total) by mouth every 14 (fourteen) days.  Dispense: 6 capsule; Refill: 0 ? ?2. Other depression, with emotional eating ?Katelyn Walker will discontinue Wellbutrin for now. Behavior modification techniques were discussed  today to help Katelyn Walker deal with her emotional/non-hunger eating behaviors.  Orders and follow up as documented in patient record.  ? ?3. At risk for osteoporosis ?Katelyn Walker was given approximately 15 minutes of osteoporosis prevention counseling today. Katelyn Walker is at risk for osteopenia and osteoporosis due to her Vitamin D deficiency. She was encouraged to take her Vit D and follow her higher calcium diet and increase strengthening exercise to help strengthen her bones and decrease her risk of osteopenia and osteoporosis.  ? ?4. Obesity, current BMI 30.85 ?Katelyn Walker is currently in the action stage of change. As such, her goal is to continue with weight loss efforts. She has agreed to keeping a food journal and adhering to recommended goals of 1200 calories and 95 grams of protein daily.  ? ?Exercise goals:  As is. ? ?Behavioral modification strategies: increasing lean protein intake, meal planning and cooking strategies, planning for success, and keeping a strict food journal. ? ?Katelyn Walker has agreed to follow-up with our clinic in 3 weeks. She was informed of the importance of frequent follow-up visits to maximize her success with intensive lifestyle modifications for her multiple health conditions.  ? ?Objective:  ? ?Blood pressure 134/86, pulse 88, temperature 98 ?F (36.7 ?C), height 5\' 7"  (1.702 m), weight 197 lb (89.4 kg), SpO2 96 %. ?Body mass index is 30.85 kg/m?. ? ?General: Cooperative, alert, well developed, in no acute distress. ?HEENT: Conjunctivae and lids unremarkable. ?Cardiovascular: Regular rhythm.  ?Lungs: Normal work of breathing. ?Neurologic: No focal deficits.  ? ?Lab Results  ?Component Value Date  ?  CREATININE 0.92 10/02/2021  ? BUN 7 10/02/2021  ? NA 140 10/02/2021  ? K 3.9 10/02/2021  ? CL 98 10/02/2021  ? CO2 24 10/02/2021  ? ?Lab Results  ?Component Value Date  ? ALT 12 10/02/2021  ? AST 13 10/02/2021  ? ALKPHOS 96 10/02/2021  ? BILITOT 0.3 10/02/2021  ? ?Lab Results  ?Component Value Date  ?  HGBA1C 5.6 01/30/2021  ? HGBA1C 5.7 (H) 06/29/2020  ? HGBA1C 5.6 01/04/2020  ? HGBA1C 5.6 09/14/2019  ? HGBA1C 5.8 (H) 03/19/2019  ? ?Lab Results  ?Component Value Date  ? INSULIN 17.2 10/02/2021  ? INSULIN 23.7 01/30/2021  ? INSULIN 21.6 01/04/2020  ? INSULIN 14.9 09/14/2019  ? INSULIN 17.7 03/19/2019  ? ?Lab Results  ?Component Value Date  ? TSH 4.080 01/04/2020  ? ?Lab Results  ?Component Value Date  ? CHOL 203 (H) 10/02/2021  ? HDL 51 10/02/2021  ? LDLCALC 126 (H) 10/02/2021  ? TRIG 144 10/02/2021  ? CHOLHDL 3.8 01/30/2021  ? ?Lab Results  ?Component Value Date  ? VD25OH 76.61 10/20/2021  ? VD25OH 37.4 10/02/2021  ? VD25OH 47.0 01/30/2021  ? ?Lab Results  ?Component Value Date  ? WBC 9.4 01/16/2022  ? HGB 12.7 01/16/2022  ? HCT 37.7 01/16/2022  ? MCV 81.1 01/16/2022  ? PLT 247 01/16/2022  ? ?Lab Results  ?Component Value Date  ? IRON 52 10/20/2021  ? TIBC 270 10/20/2021  ? FERRITIN 102 01/16/2022  ? ?Attestation Statements:  ? ?Reviewed by clinician on day of visit: allergies, medications, problem list, medical history, surgical history, family history, social history, and previous encounter notes. ? ?I, Sindy Messing, am acting as Energy manager for [employer], PA-C. ? ?I have reviewed the above documentation for accuracy and completeness, and I agree with the above. Alois Cliche, PA-C ? ?

## 2022-03-12 ENCOUNTER — Encounter: Payer: Self-pay | Admitting: Hematology and Oncology

## 2022-03-19 ENCOUNTER — Ambulatory Visit (INDEPENDENT_AMBULATORY_CARE_PROVIDER_SITE_OTHER): Payer: BC Managed Care – PPO | Admitting: Family Medicine

## 2022-03-19 ENCOUNTER — Encounter (INDEPENDENT_AMBULATORY_CARE_PROVIDER_SITE_OTHER): Payer: Self-pay | Admitting: Family Medicine

## 2022-03-19 VITALS — BP 134/83 | HR 77 | Temp 97.8°F | Ht 67.0 in | Wt 194.0 lb

## 2022-03-19 DIAGNOSIS — F3289 Other specified depressive episodes: Secondary | ICD-10-CM

## 2022-03-19 DIAGNOSIS — R7301 Impaired fasting glucose: Secondary | ICD-10-CM | POA: Diagnosis not present

## 2022-03-19 DIAGNOSIS — Z6830 Body mass index (BMI) 30.0-30.9, adult: Secondary | ICD-10-CM

## 2022-03-19 DIAGNOSIS — L304 Erythema intertrigo: Secondary | ICD-10-CM

## 2022-03-19 DIAGNOSIS — K224 Dyskinesia of esophagus: Secondary | ICD-10-CM

## 2022-03-19 DIAGNOSIS — E669 Obesity, unspecified: Secondary | ICD-10-CM

## 2022-03-19 MED ORDER — NYSTATIN 100000 UNIT/GM EX OINT: 1.0000 "application " | TOPICAL_OINTMENT | Freq: Two times a day (BID) | CUTANEOUS | 0 refills | Status: AC | PRN

## 2022-03-19 MED ORDER — MUPIROCIN 2 % EX OINT: 1.0000 "application " | TOPICAL_OINTMENT | Freq: Two times a day (BID) | CUTANEOUS | 0 refills | Status: AC

## 2022-03-19 MED ORDER — TIRZEPATIDE 10 MG/0.5ML ~~LOC~~ SOAJ: 10.0000 mg | SUBCUTANEOUS | 3 refills | Status: DC

## 2022-03-19 MED ORDER — TIRZEPATIDE 12.5 MG/0.5ML ~~LOC~~ SOAJ: 12.5000 mg | SUBCUTANEOUS | 3 refills | Status: DC

## 2022-03-20 ENCOUNTER — Encounter: Payer: Self-pay | Admitting: Hematology and Oncology

## 2022-03-21 ENCOUNTER — Other Ambulatory Visit: Payer: Self-pay | Admitting: *Deleted

## 2022-03-21 ENCOUNTER — Encounter (INDEPENDENT_AMBULATORY_CARE_PROVIDER_SITE_OTHER): Payer: Self-pay

## 2022-03-21 DIAGNOSIS — E538 Deficiency of other specified B group vitamins: Secondary | ICD-10-CM

## 2022-03-21 NOTE — Progress Notes (Signed)
Chief Complaint:   OBESITY Katelyn Walker is here to discuss her progress with her obesity treatment plan along with follow-up of her obesity related diagnoses.   Today's visit was #: 14 Starting weight: 242 lbs Starting date: 05/14/2018 Today's weight: 194 lbs Today's date: 03/19/2022 Weight change since last visit: -3 lbs Total lbs lost to date: 48 lbs Body mass index is 30.38 kg/m.  Total weight loss percentage to date: -19.83%  Current Meal Plan: keeping a food journal and adhering to recommended goals of 1200 calories and 95 grams of protein for 95% of the time.  Current Exercise Plan: Swimming for 45 minutes 1 time per week. Current Anti-Obesity Medications: Mounjaro 10 mg subcutaneously weekly. Side effects: None.  Interim History: Katelyn Walker has been diagnosed with esophageal dysmotility.  Assessment/Plan:   1. Impaired fasting glucose, with polyphagia Controlled. Goal is HgbA1c < 5.7.  Medication: Mounjaro 10 mg subcutaneously weekly.    Plan:  Continue Mounjaro 10 mg or start 12.5 mg if the 10 mg is unavailable. She will continue to focus on protein-rich, low simple carbohydrate foods. We reviewed the importance of hydration, regular exercise for stress reduction, and restorative sleep.   Lab Results  Component Value Date   HGBA1C 5.6 01/30/2021   Lab Results  Component Value Date   INSULIN 17.2 10/02/2021   INSULIN 23.7 01/30/2021   INSULIN 21.6 01/04/2020   INSULIN 14.9 09/14/2019   INSULIN 17.7 03/19/2019   - Refill tirzepatide (MOUNJARO) 10 MG/0.5ML Pen; Inject 10 mg into the skin once a week.  Dispense: 2 mL; Refill: 3 - Or Start tirzepatide (MOUNJARO) 12.5 MG/0.5ML Pen; Inject 12.5 mg into the skin once a week.  Dispense: 2 mL; Refill: 3  2. Esophageal dysmotility Katelyn Walker has been diagnosed with Esophageal dysmotility. Will continue to monitor as it relates to her weight loss journey.  3.  Intertrigo With abrasions/skin tears. Continue mupirocin ointment twice daily.   - Refill mupirocin ointment (BACTROBAN) 2 %; Apply 1 application. topically 2 (two) times daily.  Dispense: 22 g; Refill: 0  4. Other depression, with emotional eating At goal. Medication: Wellbutrin XL 150 mg daily.   Plan:  Refill Wellbutrin.  Discussed cues and consequences, how thoughts affect eating, model of thoughts, feelings, and behaviors, and strategies for change by focusing on the cue. Discussed cognitive distortions, coping thoughts, and how to change your thoughts.  5. Obesity, current BMI 30.5 Course: Katelyn Walker is currently in the action stage of change. As such, her goal is to continue with weight loss efforts.   Nutrition goals: She has agreed to keeping a food journal and adhering to recommended goals of 1200 calories and 95 grams of protein.   Exercise goals: As is.  Behavioral modification strategies: increasing lean protein intake, decreasing simple carbohydrates, increasing vegetables, and increasing water intake.  Katelyn Walker has agreed to follow-up with our clinic in 6 weeks. She was informed of the importance of frequent follow-up visits to maximize her success with intensive lifestyle modifications for her multiple health conditions.   Objective:   Blood pressure 134/83, pulse 77, temperature 97.8 F (36.6 C), temperature source Oral, height 5\' 7"  (1.702 m),  weight 194 lb (88 kg), SpO2 99 %. Body mass index is 30.38 kg/m.  General: Cooperative, alert, well developed, in no acute distress. HEENT: Conjunctivae and lids unremarkable. Cardiovascular: Regular rhythm.  Lungs: Normal work of breathing. Neurologic: No focal deficits.   Lab Results  Component Value Date   CREATININE 0.92 10/02/2021   BUN 7 10/02/2021   NA 140 10/02/2021   K 3.9 10/02/2021   CL 98 10/02/2021   CO2 24 10/02/2021   Lab Results  Component Value Date   ALT 12 10/02/2021   AST 13 10/02/2021   ALKPHOS  96 10/02/2021   BILITOT 0.3 10/02/2021   Lab Results  Component Value Date   HGBA1C 5.6 01/30/2021   HGBA1C 5.7 (H) 06/29/2020   HGBA1C 5.6 01/04/2020   HGBA1C 5.6 09/14/2019   HGBA1C 5.8 (H) 03/19/2019   Lab Results  Component Value Date   INSULIN 17.2 10/02/2021   INSULIN 23.7 01/30/2021   INSULIN 21.6 01/04/2020   INSULIN 14.9 09/14/2019   INSULIN 17.7 03/19/2019   Lab Results  Component Value Date   TSH 4.080 01/04/2020   Lab Results  Component Value Date   CHOL 203 (H) 10/02/2021   HDL 51 10/02/2021   LDLCALC 126 (H) 10/02/2021   TRIG 144 10/02/2021   CHOLHDL 3.8 01/30/2021   Lab Results  Component Value Date   VD25OH 76.61 10/20/2021   VD25OH 37.4 10/02/2021   VD25OH 47.0 01/30/2021   Lab Results  Component Value Date   WBC 9.4 01/16/2022   HGB 12.7 01/16/2022   HCT 37.7 01/16/2022   MCV 81.1 01/16/2022   PLT 247 01/16/2022   Lab Results  Component Value Date   IRON 52 10/20/2021   TIBC 270 10/20/2021   FERRITIN 102 01/16/2022   Attestation Statements:   Reviewed by clinician on day of visit: allergies, medications, problem list, medical history, surgical history, family history, social history, and previous encounter notes.  Leodis Binet Friedenbach, CMA, am acting as Location manager for [employer], DO.  I have reviewed the above documentation for accuracy and completeness, and I agree with the above. -  Briscoe Deutscher, DO, MS, FAAFP, DABOM - Family and Bariatric Medicine.

## 2022-03-22 ENCOUNTER — Other Ambulatory Visit: Payer: Self-pay

## 2022-03-22 ENCOUNTER — Encounter: Payer: Self-pay | Admitting: Hematology and Oncology

## 2022-03-22 ENCOUNTER — Inpatient Hospital Stay: Payer: BC Managed Care – PPO | Attending: Hematology

## 2022-03-22 DIAGNOSIS — E538 Deficiency of other specified B group vitamins: Secondary | ICD-10-CM | POA: Diagnosis not present

## 2022-03-22 DIAGNOSIS — D509 Iron deficiency anemia, unspecified: Secondary | ICD-10-CM | POA: Diagnosis not present

## 2022-03-22 LAB — VITAMIN B12: Vitamin B-12: 353 pg/mL (ref 180–914)

## 2022-03-22 LAB — CBC WITH DIFFERENTIAL (CANCER CENTER ONLY)
Abs Immature Granulocytes: 0.02 10*3/uL (ref 0.00–0.07)
Basophils Absolute: 0 10*3/uL (ref 0.0–0.1)
Basophils Relative: 0 %
Eosinophils Absolute: 0.1 10*3/uL (ref 0.0–0.5)
Eosinophils Relative: 2 %
HCT: 39.6 % (ref 36.0–46.0)
Hemoglobin: 13.1 g/dL (ref 12.0–15.0)
Immature Granulocytes: 0 %
Lymphocytes Relative: 21 %
Lymphs Abs: 1.7 10*3/uL (ref 0.7–4.0)
MCH: 27.3 pg (ref 26.0–34.0)
MCHC: 33.1 g/dL (ref 30.0–36.0)
MCV: 82.7 fL (ref 80.0–100.0)
Monocytes Absolute: 0.5 10*3/uL (ref 0.1–1.0)
Monocytes Relative: 6 %
Neutro Abs: 5.9 10*3/uL (ref 1.7–7.7)
Neutrophils Relative %: 71 %
Platelet Count: 290 10*3/uL (ref 150–400)
RBC: 4.79 MIL/uL (ref 3.87–5.11)
RDW: 12.6 % (ref 11.5–15.5)
WBC Count: 8.3 10*3/uL (ref 4.0–10.5)
nRBC: 0 % (ref 0.0–0.2)

## 2022-03-22 LAB — FERRITIN: Ferritin: 76 ng/mL (ref 11–307)

## 2022-03-27 ENCOUNTER — Encounter (INDEPENDENT_AMBULATORY_CARE_PROVIDER_SITE_OTHER): Payer: Self-pay | Admitting: Family Medicine

## 2022-03-29 ENCOUNTER — Other Ambulatory Visit: Payer: BC Managed Care – PPO

## 2022-04-06 ENCOUNTER — Encounter: Payer: Self-pay | Admitting: Hematology and Oncology

## 2022-04-10 ENCOUNTER — Ambulatory Visit (INDEPENDENT_AMBULATORY_CARE_PROVIDER_SITE_OTHER): Payer: BC Managed Care – PPO | Admitting: Bariatrics

## 2022-04-10 ENCOUNTER — Encounter (INDEPENDENT_AMBULATORY_CARE_PROVIDER_SITE_OTHER): Payer: Self-pay | Admitting: Bariatrics

## 2022-04-10 ENCOUNTER — Encounter (INDEPENDENT_AMBULATORY_CARE_PROVIDER_SITE_OTHER): Payer: Self-pay

## 2022-04-10 ENCOUNTER — Telehealth (INDEPENDENT_AMBULATORY_CARE_PROVIDER_SITE_OTHER): Payer: Self-pay | Admitting: Family Medicine

## 2022-04-10 VITALS — BP 128/79 | HR 73 | Temp 98.1°F | Ht 67.0 in | Wt 192.0 lb

## 2022-04-10 DIAGNOSIS — F5089 Other specified eating disorder: Secondary | ICD-10-CM | POA: Diagnosis not present

## 2022-04-10 DIAGNOSIS — R7301 Impaired fasting glucose: Secondary | ICD-10-CM

## 2022-04-10 DIAGNOSIS — E559 Vitamin D deficiency, unspecified: Secondary | ICD-10-CM

## 2022-04-10 DIAGNOSIS — Z6830 Body mass index (BMI) 30.0-30.9, adult: Secondary | ICD-10-CM

## 2022-04-10 DIAGNOSIS — E669 Obesity, unspecified: Secondary | ICD-10-CM | POA: Diagnosis not present

## 2022-04-10 DIAGNOSIS — E66812 Obesity, class 2: Secondary | ICD-10-CM

## 2022-04-10 DIAGNOSIS — F509 Eating disorder, unspecified: Secondary | ICD-10-CM | POA: Insufficient documentation

## 2022-04-10 MED ORDER — BUPROPION HCL ER (XL) 150 MG PO TB24: 150.0000 mg | ORAL_TABLET | Freq: Every day | ORAL | 0 refills | Status: DC

## 2022-04-10 NOTE — Telephone Encounter (Signed)
Prior authorization denied for Mounjaro. Patient already uses copay card. Patient sent denial message via mychart. ?

## 2022-04-18 NOTE — Progress Notes (Signed)
? ? ? ?Chief Complaint:  ? ?OBESITY ?[name] is here to discuss her progress with her obesity treatment plan along with follow-up of her obesity related diagnoses. Katelyn Walker is on keeping a food journal and adhering to recommended goals of 1200 calories and 95 protein and states she is following her eating plan approximately 95% of the time. Katelyn Walker states she is swimming 30 minutes 2 times per week. ? ?Today's visit was #: 78 ?Starting weight: 242 lbs ?Starting date: 05/14/2018 ?Today's weight: 192 lbs ?Today's date: 04/10/2022 ?Total lbs lost to date: 50 lbs ?Total lbs lost since last in-office visit: 2 lbs ? ?Interim History: Katelyn Walker is down 2 lbs since her last visit. She normally sees Dr. Earlene Plater or Alois Cliche, NP. Her new goal is at 170 lbs. ? ?Subjective:  ? ?1. Vitamin D deficiency ?She is currently taking prescription vitamin D 50,000 IU each week. She denies nausea, vomiting or muscle weakness. ? ?2. Impaired fasting glucose, with polyphagia ?Katelyn Walker is taking Mounjaro (may expire in June)12.5 mg into the skin once weekly. ? ?3. Other disorder of eating ?Katelyn Walker is taking Wellbutrin. ? ?Assessment/Plan:  ? ?1. Vitamin D deficiency ?Low Vitamin D level contributes to fatigue and are associated with obesity, breast, and colon cancer. She agrees to continue to take prescription Vitamin D @50 ,000 IU every week and will follow-up for routine testing of Vitamin D, at least 2-3 times per year to avoid over-replacement. ? ?2. Impaired fasting glucose, with polyphagia ?[name] will call her insurance for approved weight loss medications. ? ?3. Other disorder of eating ?Will refill Wellbutrin. ?- buPROPion (WELLBUTRIN XL) 150 MG 24 hr tablet; Take 1 tablet (150 mg total) by mouth daily.  Dispense: 30 tablet; Refill: 0 ? ?4. Obesity, Current BMI 30.1 ?Katelyn Walker is currently in the action stage of change. As such, her goal is to continue with weight loss efforts. She has agreed to keeping a food journal and adhering to  recommended goals of 1,200 calories and 95 protein.  ? ?Katelyn Walker agreed to meal planning and will adhere closely to the plan. ? ?Exercise goals: Katelyn Walker will continue to swim and increase exercising. ? ?Behavioral modification strategies: increasing lean protein intake, decreasing simple carbohydrates, increasing vegetables, increasing water intake, decreasing eating out, no skipping meals, meal planning and cooking strategies, keeping healthy foods in the home, and planning for success. ? ?Katelyn Walker has agreed to follow-up with our clinic in 4 weeks. She was informed of the importance of frequent follow-up visits to maximize her success with intensive lifestyle modifications for her multiple health conditions.  ? ?Objective:  ? ?Blood pressure 128/79, pulse 73, temperature 98.1 ?F (36.7 ?C), height 5\' 7"  (1.702 m), weight 192 lb (87.1 kg), SpO2 99 %. ?Body mass index is 30.07 kg/m?. ? ?General: Cooperative, alert, well developed, in no acute distress. ?HEENT: Conjunctivae and lids unremarkable. ?Cardiovascular: Regular rhythm.  ?Lungs: Normal work of breathing. ?Neurologic: No focal deficits.  ? ?Lab Results  ?Component Value Date  ? CREATININE 0.92 10/02/2021  ? BUN 7 10/02/2021  ? NA 140 10/02/2021  ? K 3.9 10/02/2021  ? CL 98 10/02/2021  ? CO2 24 10/02/2021  ? ?Lab Results  ?Component Value Date  ? ALT 12 10/02/2021  ? AST 13 10/02/2021  ? ALKPHOS 96 10/02/2021  ? BILITOT 0.3 10/02/2021  ? ?Lab Results  ?Component Value Date  ? HGBA1C 5.6 01/30/2021  ? HGBA1C 5.7 (H) 06/29/2020  ? HGBA1C 5.6 01/04/2020  ? HGBA1C 5.6 09/14/2019  ? HGBA1C 5.8 (  H) 03/19/2019  ? ?Lab Results  ?Component Value Date  ? INSULIN 17.2 10/02/2021  ? INSULIN 23.7 01/30/2021  ? INSULIN 21.6 01/04/2020  ? INSULIN 14.9 09/14/2019  ? INSULIN 17.7 03/19/2019  ? ?Lab Results  ?Component Value Date  ? TSH 4.080 01/04/2020  ? ?Lab Results  ?Component Value Date  ? CHOL 203 (H) 10/02/2021  ? HDL 51 10/02/2021  ? LDLCALC 126 (H) 10/02/2021  ? TRIG 144  10/02/2021  ? CHOLHDL 3.8 01/30/2021  ? ?Lab Results  ?Component Value Date  ? VD25OH 76.61 10/20/2021  ? VD25OH 37.4 10/02/2021  ? VD25OH 47.0 01/30/2021  ? ?Lab Results  ?Component Value Date  ? WBC 8.3 03/22/2022  ? HGB 13.1 03/22/2022  ? HCT 39.6 03/22/2022  ? MCV 82.7 03/22/2022  ? PLT 290 03/22/2022  ? ?Lab Results  ?Component Value Date  ? IRON 52 10/20/2021  ? TIBC 270 10/20/2021  ? FERRITIN 76 03/22/2022  ? ?Attestation Statements:  ? ?Reviewed by clinician on day of visit: allergies, medications, problem list, medical history, surgical history, family history, social history, and previous encounter notes. ? ?I, Paulla Fore, CMA, am acting as transcriptionist for Dr. Corinna Capra, DO.  ? ?I have reviewed the above documentation for accuracy and completeness, and I agree with the above. Corinna Capra, DO ? ?

## 2022-04-19 ENCOUNTER — Encounter (INDEPENDENT_AMBULATORY_CARE_PROVIDER_SITE_OTHER): Payer: Self-pay | Admitting: Bariatrics

## 2022-05-01 ENCOUNTER — Encounter: Payer: Self-pay | Admitting: Hematology and Oncology

## 2022-05-08 ENCOUNTER — Encounter (INDEPENDENT_AMBULATORY_CARE_PROVIDER_SITE_OTHER): Payer: Self-pay | Admitting: Adult Health

## 2022-05-08 ENCOUNTER — Ambulatory Visit (INDEPENDENT_AMBULATORY_CARE_PROVIDER_SITE_OTHER): Payer: BC Managed Care – PPO | Admitting: Adult Health

## 2022-05-08 VITALS — BP 128/73 | HR 81 | Ht 67.0 in | Wt 191.0 lb

## 2022-05-08 DIAGNOSIS — E669 Obesity, unspecified: Secondary | ICD-10-CM | POA: Diagnosis not present

## 2022-05-08 DIAGNOSIS — Z6830 Body mass index (BMI) 30.0-30.9, adult: Secondary | ICD-10-CM

## 2022-05-08 DIAGNOSIS — Z9189 Other specified personal risk factors, not elsewhere classified: Secondary | ICD-10-CM

## 2022-05-08 DIAGNOSIS — F5089 Other specified eating disorder: Secondary | ICD-10-CM

## 2022-05-08 DIAGNOSIS — E559 Vitamin D deficiency, unspecified: Secondary | ICD-10-CM

## 2022-05-08 MED ORDER — BUPROPION HCL ER (XL) 150 MG PO TB24: 150.0000 mg | ORAL_TABLET | Freq: Every day | ORAL | 0 refills | Status: AC

## 2022-05-09 ENCOUNTER — Encounter (INDEPENDENT_AMBULATORY_CARE_PROVIDER_SITE_OTHER): Payer: Self-pay | Admitting: Adult Health

## 2022-05-09 ENCOUNTER — Other Ambulatory Visit (INDEPENDENT_AMBULATORY_CARE_PROVIDER_SITE_OTHER): Payer: Self-pay | Admitting: Adult Health

## 2022-05-09 DIAGNOSIS — E559 Vitamin D deficiency, unspecified: Secondary | ICD-10-CM

## 2022-05-09 LAB — VITAMIN D 25 HYDROXY (VIT D DEFICIENCY, FRACTURES): Vit D, 25-Hydroxy: 30.9 ng/mL (ref 30.0–100.0)

## 2022-05-09 MED ORDER — WEGOVY 1.7 MG/0.75ML ~~LOC~~ SOAJ: 1.7000 mg | SUBCUTANEOUS | 0 refills | Status: DC

## 2022-05-09 MED ORDER — VITAMIN D (ERGOCALCIFEROL) 1.25 MG (50000 UNIT) PO CAPS: 50000.0000 [IU] | ORAL_CAPSULE | ORAL | 0 refills | Status: AC

## 2022-05-09 NOTE — Telephone Encounter (Signed)
Pt requesting rx for wegovy since mounjaro was denied.

## 2022-05-09 NOTE — Telephone Encounter (Signed)
FYI

## 2022-05-10 ENCOUNTER — Encounter (INDEPENDENT_AMBULATORY_CARE_PROVIDER_SITE_OTHER): Payer: Self-pay

## 2022-05-10 ENCOUNTER — Telehealth (INDEPENDENT_AMBULATORY_CARE_PROVIDER_SITE_OTHER): Payer: Self-pay | Admitting: Adult Health

## 2022-05-10 ENCOUNTER — Encounter (INDEPENDENT_AMBULATORY_CARE_PROVIDER_SITE_OTHER): Payer: Self-pay | Admitting: Adult Health

## 2022-05-10 NOTE — Progress Notes (Signed)
Chief Complaint:   OBESITY Katelyn Walker is here to discuss her progress with her obesity treatment plan along with follow-up of her obesity related diagnoses. [name] is on keeping a food journal and adhering to recommended goals of 1200 calories and 95 gms protein daily and states she is following her eating plan approximately 95% of the time. [name] states she is walking for 2 miles/swimming 40 to 60 minutes 2 times per week  Today's visit was #: 63 Starting weight: 242 lbs Starting date: 05/14/2018 Today's weight: 191 lbs Today's date: 05/08/22 Total lbs lost to date: 51 lbs Total lbs lost since last in-office visit: -1  Interim History:  Per [name]- she has never weighed less than 200 lbs since age 89. Interval goals: Weight <199 lbs, then down to 180 lbs, then finally achieve- 175 lbs with a subsequent BMI 27.  Dr. Wallace[facility]-provided Mounjaro 10 mg Rx and then an additional 4 refills with Mounjaro 12.5 mg.  Subjective:   1. Vitamin D deficiency Currently on ergocalciferol every 14 days- denies N/V/Muscle weakness. 10/20/2021 vitamin D level-76.61.  2. Other disorder of eating She reports stable mood denies suicidal ideation/homicidal ideation. She is on buPROPion (WELLBUTRIN XL) 150 MG QD  3. At risk for constipation Related to taking Mounjaro for obesity.  Assessment/Plan:   1. Vitamin D deficiency Check labs, then MyChart patient with level and refill ergocalciferol as appropriate. - VITAMIN D 25 Hydroxy (Vit-D Deficiency, Fractures)  2. Other disorder of eating Refill- buPROPion (WELLBUTRIN XL) 150 MG 24 hr tablet; Take 1 tablet (150 mg total) by mouth daily.  Dispense: 30 tablet; Refill: 0  3. At risk for constipation Kaylynne was given approximately 15 minutes of counseling today regarding prevention of constipation. She was encouraged to increase water and fiber intake.    4. Obesity, Current BMI 30.0 Passion is currently in the action stage of  change. As such, her goal is to continue with weight loss efforts. She has agreed to keeping a food journal and adhering to recommended goals of 1200 calories and 95 gms protein daily .   Dr. Wallace/[facility]-provided Mounjaro 10 mg Rx and then an additional 4 refills with Mounjaro 12.5 mg.  Exercise goals: as is.  Behavioral modification strategies: increasing lean protein intake, decreasing simple carbohydrates, meal planning and cooking strategies, keeping healthy foods in the home, and planning for success.  [name] has agreed to follow-up with our clinic in 4 weeks. She was informed of the importance of frequent follow-up visits to maximize her success with intensive lifestyle modifications for her multiple health conditions.   Ivan was informed we would discuss her lab results at her next visit unless there is a critical issue that needs to be addressed sooner. Jalei agreed to keep her next visit at the agreed upon time to discuss these results.  Objective:   Blood pressure 128/73, pulse 81, height 5\' 7"  (1.702 m), weight 191 lb (86.6 kg), SpO2 100 %. Body mass index is 29.91 kg/m.  General: Cooperative, alert, well developed, in no acute distress. HEENT: Conjunctivae and lids unremarkable. Cardiovascular: Regular rhythm.  Lungs: Normal work of breathing. Neurologic: No focal deficits.   Lab Results  Component Value Date   CREATININE 0.92 10/02/2021   BUN 7 10/02/2021   NA 140 10/02/2021   K 3.9 10/02/2021   CL 98 10/02/2021   CO2 24 10/02/2021   Lab Results  Component Value Date   ALT 12 10/02/2021   AST 13 10/02/2021  ALKPHOS 96 10/02/2021   BILITOT 0.3 10/02/2021   Lab Results  Component Value Date   HGBA1C 5.6 01/30/2021   HGBA1C 5.7 (H) 06/29/2020   HGBA1C 5.6 01/04/2020   HGBA1C 5.6 09/14/2019   HGBA1C 5.8 (H) 03/19/2019   Lab Results  Component Value Date   INSULIN 17.2 10/02/2021   INSULIN 23.7 01/30/2021   INSULIN 21.6 01/04/2020   INSULIN 14.9  09/14/2019   INSULIN 17.7 03/19/2019   Lab Results  Component Value Date   TSH 4.080 01/04/2020   Lab Results  Component Value Date   CHOL 203 (H) 10/02/2021   HDL 51 10/02/2021   LDLCALC 126 (H) 10/02/2021   TRIG 144 10/02/2021   CHOLHDL 3.8 01/30/2021   Lab Results  Component Value Date   VD25OH 30.9 05/08/2022   VD25OH 76.61 10/20/2021   VD25OH 37.4 10/02/2021   Lab Results  Component Value Date   WBC 8.3 03/22/2022   HGB 13.1 03/22/2022   HCT 39.6 03/22/2022   MCV 82.7 03/22/2022   PLT 290 03/22/2022   Lab Results  Component Value Date   IRON 52 10/20/2021   TIBC 270 10/20/2021   FERRITIN 76 03/22/2022    Attestation Statements:   Reviewed by clinician on day of visit: allergies, medications, problem list, medical history, surgical history, family history, social history, and previous encounter notes.  I, Jesse Sans, FNP, am acting as Energy manager for William Hamburger, NP.  I have reviewed the above documentation for accuracy and completeness, and I agree with the above. -  Yaffa Seckman d. Lashone Stauber, NP-C

## 2022-05-10 NOTE — Telephone Encounter (Signed)
William Hamburger - Prior authorization denied for [employer]. Per insurance: not a covered benefit/plan exclusion. Patient sent denial message via mychart

## 2022-05-17 ENCOUNTER — Telehealth: Payer: BC Managed Care – PPO | Admitting: Physician Assistant

## 2022-05-17 ENCOUNTER — Encounter: Payer: Self-pay | Admitting: Hematology and Oncology

## 2022-05-17 DIAGNOSIS — T7840XA Allergy, unspecified, initial encounter: Secondary | ICD-10-CM | POA: Diagnosis not present

## 2022-05-17 MED ORDER — METHYLPREDNISOLONE 4 MG PO TBPK
ORAL_TABLET | ORAL | 0 refills | Status: DC
Start: 2022-05-17 — End: 2023-05-20

## 2022-05-17 NOTE — Patient Instructions (Signed)
  Katelyn Walker, thank you for joining Katelyn Climes, Katelyn Walker for today's virtual visit.  While this provider is not your primary care provider (PCP), if your PCP is located in our provider database this encounter information will be shared with them immediately following your visit.  Consent: (Patient) Katelyn Walker provided verbal consent for this virtual visit at the beginning of the encounter.  Current Medications:  Current Outpatient Medications:    Semaglutide-Weight Management (WEGOVY) 1.7 MG/0.75ML SOAJ, Inject 1.7 mg into the skin once a week., Disp: 3 mL, Rfl: 0   buPROPion (WELLBUTRIN XL) 150 MG 24 hr tablet, Take 1 tablet (150 mg total) by mouth daily., Disp: 30 tablet, Rfl: 0   cyanocobalamin (,VITAMIN B-12,) 1000 MCG/ML injection, Inject 1 mL (1,000 mcg total) into the muscle every 30 (thirty) days., Disp: 10 mL, Rfl: 0   mupirocin ointment (BACTROBAN) 2 %, Apply 1 application. topically 2 (two) times daily., Disp: 22 g, Rfl: 0   Norgestimate-Eth Estradiol (ESTARYLLA PO), Take 28 mg by mouth., Disp: , Rfl:    nystatin ointment (MYCOSTATIN), Apply 1 application. topically 2 (two) times daily as needed., Disp: 30 g, Rfl: 0   omeprazole (PRILOSEC) 40 MG capsule, Take 1 capsule (40 mg total) by mouth daily., Disp: 90 capsule, Rfl: 5   ondansetron (ZOFRAN) 4 MG tablet, Take 1 tablet (4 mg total) by mouth every 8 (eight) hours as needed for nausea or vomiting., Disp: 20 tablet, Rfl: 0   SYRINGE/NEEDLE, DISP, 1 ML (B-D 1CC SLIP TIP SYR 25GX5/8") 25G X 5/8" 1 ML MISC, Use to administer b12 injections, Disp: 50 each, Rfl: 0   traZODone (DESYREL) 50 MG tablet, Take 0.5-1 tablets (25-50 mg total) by mouth at bedtime as needed for sleep., Disp: 90 tablet, Rfl: 0   Vitamin D, Ergocalciferol, (DRISDOL) 1.25 MG (50000 UNIT) CAPS capsule, Take 1 capsule (50,000 Units total) by mouth every 7 (seven) days., Disp: 8 capsule, Rfl: 0   Medications ordered in this encounter:  No orders of  the defined types were placed in this encounter.    *If you need refills on other medications prior to your next appointment, please contact your pharmacy*  Follow-Up: Call back or seek an in-person evaluation if the symptoms worsen or if the condition fails to improve as anticipated.  Other Instructions Please keep skin clean and dry.  Start a  daily antihistamine as discussed. Take the steroid as directed. Wash your pillow case before reuse, just to be cautious. If not improving/resolving or anything worsening, please be evaluated in person.    If you have been instructed to have an in-person evaluation today at a local Urgent Care facility, please use the link below. It will take you to a list of all of our available [facility], including address, phone number and hours of operation. Please do not delay care.  [facility]  If you or a family member do not have a primary care provider, use the link below to schedule a visit and establish care. When you choose a [facility] primary care physician or advanced practice provider, you gain a long-term partner in health. Find a Primary Care Provider  Learn more about [facility] in-office and virtual care options: [facility] - Get Care Now

## 2022-05-17 NOTE — Progress Notes (Signed)
Virtual Visit Consent   Katelyn Walker, you are scheduled for a virtual visit with a [facility] provider today. Just as with appointments in the office, your consent must be obtained to participate. Your consent will be active for this visit and any virtual visit you may have with one of our providers in the next 365 days. If you have a MyChart account, a copy of this consent can be sent to you electronically.  As this is a virtual visit, video technology does not allow for your provider to perform a traditional examination. This may limit your provider's ability to fully assess your condition. If your provider identifies any concerns that need to be evaluated in person or the need to arrange testing (such as labs, EKG, etc.), we will make arrangements to do so. Although advances in technology are sophisticated, we cannot ensure that it will always work on either your end or our end. If the connection with a video visit is poor, the visit may have to be switched to a telephone visit. With either a video or telephone visit, we are not always able to ensure that we have a secure connection.  By engaging in this virtual visit, you consent to the provision of healthcare and authorize for your insurance to be billed (if applicable) for the services provided during this visit. Depending on your insurance coverage, you may receive a charge related to this service.  I need to obtain your verbal consent now. Are you willing to proceed with your visit today? Katelyn Walker has provided verbal consent on 05/17/2022 for a virtual visit (video or telephone). Katelyn Walker, [place]  Date: 05/17/2022 8:20 AM  Virtual Visit via Video Note   I, Katelyn Walker, connected with  Katelyn Walker  ([id number], Katelyn Walker) on 05/17/22 at  8:15 AM EDT by a video-enabled telemedicine application and verified that I am speaking with the correct person using two identifiers.  Location: Patient: Virtual Visit  Location Patient: Home Provider: Virtual Visit Location Provider: Home Office   I discussed the limitations of evaluation and management by telemedicine and the availability of in person appointments. The patient expressed understanding and agreed to proceed.    History of Present Illness: Katelyn Walker is a 51 y.o. who identifies as a female who was assigned female at birth, and is being seen today for a bite to her lower lip the other day, now with lip swelling (upper and lower) and itching. Notes initially thinking was a small mosquito bite in the area. Woke up next morning with itching and swelling that has persisted. No remnant of the "bite itself". Denies any rash or blistering lesion. Denies facial swelling, fever, chills, aches.  HPI: HPI  Problems:  Patient Active Problem List   Diagnosis Date Noted   Impaired fasting glucose, with polyphagia 04/10/2022   Eating disorder 04/10/2022   Dysphagia    Heartburn    Iron deficiency anemia 10/24/2021   Polyphagia 01/02/2021   Iron deficiency 01/02/2021   B12 deficiency 01/02/2021   Mixed hyperlipidemia 01/02/2021   Essential hypertension, Rx HCTZ for mild LE edema, off Diovan with weight loss 05/13/2019   Prediabetes, Rx Metformin 03/12/2019   Class 2 severe obesity with serious comorbidity and body mass index (BMI) of 35.0 to 35.9 in adult [facility]) 03/12/2019   Emotional eating, Rx Contrave, phentermine 03/12/2019   Vitamin D deficiency 05/14/2018   GERD (gastroesophageal reflux disease), with Hx of esophagitis, esophageal stricture, hiatal hernia, Rx  Omeprazole 08/18/2017    Allergies: No Known Allergies Medications:  Current Outpatient Medications:    methylPREDNISolone (MEDROL DOSEPAK) 4 MG TBPK tablet, Take following package directions, Disp: 21 tablet, Rfl: 0   Semaglutide-Weight Management (WEGOVY) 1.7 MG/0.75ML SOAJ, Inject 1.7 mg into the skin once a week., Disp: 3 mL, Rfl: 0   buPROPion (WELLBUTRIN XL) 150 MG 24 hr  tablet, Take 1 tablet (150 mg total) by mouth daily., Disp: 30 tablet, Rfl: 0   cyanocobalamin (,VITAMIN B-12,) 1000 MCG/ML injection, Inject 1 mL (1,000 mcg total) into the muscle every 30 (thirty) days., Disp: 10 mL, Rfl: 0   mupirocin ointment (BACTROBAN) 2 %, Apply 1 application. topically 2 (two) times daily., Disp: 22 g, Rfl: 0   Norgestimate-Eth Estradiol (ESTARYLLA PO), Take 28 mg by mouth., Disp: , Rfl:    nystatin ointment (MYCOSTATIN), Apply 1 application. topically 2 (two) times daily as needed., Disp: 30 g, Rfl: 0   omeprazole (PRILOSEC) 40 MG capsule, Take 1 capsule (40 mg total) by mouth daily., Disp: 90 capsule, Rfl: 5   ondansetron (ZOFRAN) 4 MG tablet, Take 1 tablet (4 mg total) by mouth every 8 (eight) hours as needed for nausea or vomiting., Disp: 20 tablet, Rfl: 0   SYRINGE/NEEDLE, DISP, 1 ML (B-D 1CC SLIP TIP SYR 25GX5/8") 25G X 5/8" 1 ML MISC, Use to administer b12 injections, Disp: 50 each, Rfl: 0   traZODone (DESYREL) 50 MG tablet, Take 0.5-1 tablets (25-50 mg total) by mouth at bedtime as needed for sleep., Disp: 90 tablet, Rfl: 0   Vitamin D, Ergocalciferol, (DRISDOL) 1.25 MG (50000 UNIT) CAPS capsule, Take 1 capsule (50,000 Units total) by mouth every 7 (seven) days., Disp: 8 capsule, Rfl: 0  Observations/Objective: Patient is well-developed, well-nourished in no acute distress.  Resting comfortably at home.  Head is normocephalic, atraumatic.  No labored breathing. Speech is clear and coherent with logical content.  Patient is alert and oriented at baseline.  Lower lip swelling noted with erythema. Some upper l ip swelling noted but mild. No noted facial swelling, rash or lesion of lip or surrounding skin.  Assessment and Plan: 1. Allergic reaction, initial encounter - methylPREDNISolone (MEDROL DOSEPAK) 4 MG TBPK tablet; Take following package directions  Dispense: 21 tablet; Refill: 0  Start Medrol dose pack. Keep skin clean and dry. Start OTC Claritin or Xyzal  daily. Follow-up in person if not resolving or for any new/worsening symptoms  Follow Up Instructions: I discussed the assessment and treatment plan with the patient. The patient was provided an opportunity to ask questions and all were answered. The patient agreed with the plan and demonstrated an understanding of the instructions.  A copy of instructions were sent to the patient via MyChart unless otherwise noted below.   The patient was advised to call back or seek an in-person evaluation if the symptoms worsen or if the condition fails to improve as anticipated.  Time:  I spent 10 minutes with the patient via telehealth technology discussing the above problems/concerns.    Katelyn Climes, PA-C

## 2022-05-23 ENCOUNTER — Encounter (INDEPENDENT_AMBULATORY_CARE_PROVIDER_SITE_OTHER): Payer: Self-pay | Admitting: Adult Health

## 2022-05-23 DIAGNOSIS — Z01419 Encounter for gynecological examination (general) (routine) without abnormal findings: Secondary | ICD-10-CM | POA: Diagnosis not present

## 2022-05-23 DIAGNOSIS — Z1231 Encounter for screening mammogram for malignant neoplasm of breast: Secondary | ICD-10-CM | POA: Diagnosis not present

## 2022-05-23 DIAGNOSIS — Z124 Encounter for screening for malignant neoplasm of cervix: Secondary | ICD-10-CM | POA: Diagnosis not present

## 2022-05-23 DIAGNOSIS — Z6830 Body mass index (BMI) 30.0-30.9, adult: Secondary | ICD-10-CM | POA: Diagnosis not present

## 2022-05-23 NOTE — Telephone Encounter (Signed)
Patient's appt has been canceled.

## 2022-05-23 NOTE — Telephone Encounter (Signed)
Appointment cancellation request.

## 2022-06-05 ENCOUNTER — Other Ambulatory Visit: Payer: Self-pay | Admitting: Hematology and Oncology

## 2022-06-05 ENCOUNTER — Encounter: Payer: Self-pay | Admitting: Hematology and Oncology

## 2022-06-05 ENCOUNTER — Other Ambulatory Visit: Payer: Self-pay

## 2022-06-05 ENCOUNTER — Inpatient Hospital Stay (HOSPITAL_BASED_OUTPATIENT_CLINIC_OR_DEPARTMENT_OTHER): Payer: BC Managed Care – PPO | Admitting: Hematology and Oncology

## 2022-06-05 ENCOUNTER — Other Ambulatory Visit: Payer: Self-pay | Admitting: Lab

## 2022-06-05 ENCOUNTER — Inpatient Hospital Stay: Payer: BC Managed Care – PPO | Attending: Hematology

## 2022-06-05 VITALS — BP 140/91 | HR 72 | Temp 97.9°F | Resp 18 | Ht 67.0 in | Wt 192.3 lb

## 2022-06-05 DIAGNOSIS — E538 Deficiency of other specified B group vitamins: Secondary | ICD-10-CM | POA: Diagnosis not present

## 2022-06-05 DIAGNOSIS — D509 Iron deficiency anemia, unspecified: Secondary | ICD-10-CM | POA: Diagnosis not present

## 2022-06-05 DIAGNOSIS — E559 Vitamin D deficiency, unspecified: Secondary | ICD-10-CM

## 2022-06-05 DIAGNOSIS — Z79899 Other long term (current) drug therapy: Secondary | ICD-10-CM | POA: Insufficient documentation

## 2022-06-05 DIAGNOSIS — E611 Iron deficiency: Secondary | ICD-10-CM

## 2022-06-05 LAB — CBC WITH DIFFERENTIAL/PLATELET
Abs Immature Granulocytes: 0.02 10*3/uL (ref 0.00–0.07)
Basophils Absolute: 0 10*3/uL (ref 0.0–0.1)
Basophils Relative: 0 %
Eosinophils Absolute: 0.1 10*3/uL (ref 0.0–0.5)
Eosinophils Relative: 2 %
HCT: 38.9 % (ref 36.0–46.0)
Hemoglobin: 13.1 g/dL (ref 12.0–15.0)
Immature Granulocytes: 0 %
Lymphocytes Relative: 21 %
Lymphs Abs: 1.6 10*3/uL (ref 0.7–4.0)
MCH: 27.5 pg (ref 26.0–34.0)
MCHC: 33.7 g/dL (ref 30.0–36.0)
MCV: 81.7 fL (ref 80.0–100.0)
Monocytes Absolute: 0.4 10*3/uL (ref 0.1–1.0)
Monocytes Relative: 5 %
Neutro Abs: 5.6 10*3/uL (ref 1.7–7.7)
Neutrophils Relative %: 72 %
Platelets: 280 10*3/uL (ref 150–400)
RBC: 4.76 MIL/uL (ref 3.87–5.11)
RDW: 12.9 % (ref 11.5–15.5)
WBC: 7.7 10*3/uL (ref 4.0–10.5)
nRBC: 0 % (ref 0.0–0.2)

## 2022-06-05 LAB — FERRITIN: Ferritin: 52 ng/mL (ref 11–307)

## 2022-06-05 LAB — IRON AND IRON BINDING CAPACITY (CC-WL,HP ONLY)
Iron: 81 ug/dL (ref 28–170)
Saturation Ratios: 31 % (ref 10.4–31.8)
TIBC: 259 ug/dL (ref 250–450)
UIBC: 178 ug/dL (ref 148–442)

## 2022-06-05 NOTE — Progress Notes (Signed)
[facility]   Telephone:([phone] Fax:([phone]   Clinic Follow up Note   Patient Care Team: Laurann Montana, MD as PCP - General (Family Medicine)  Date of Service:  06/05/2022  CHIEF COMPLAINT: f/u of iron deficiency anemia  CURRENT THERAPY:  B12  ASSESSMENT & PLAN:  Katelyn Walker is a 51 y.o. female with   1. Iron Deficiency Anemia  -presented with fatigue and lightheadedness.  --Lab work 01/2021 showed low iron and ferritin levels, hgb WNL. Initially referred to Dr. Al Pimple 02/2021 --she tried oral iron but had no significant improvement. --last colonoscopy in 2019 was benign with some internal hemorrhoids. She is on birth control, periods are regular and not heavy. Katelyn Walker disease work up was negative, I still suspect she has iron absorption issue likely from hiatal hernia  --she received 5 doses of IV venofer in 03/2021.  --endoscopy 09/21/21 with Dr. Barron Alvine showed hiatal hernia, gastric biopsies showed only mild chronic inflammation. -- She has been feeling well, CBC from today shows hemoglobin of 13 g/dl, iron profile pending. -- No evidence of IDA today,  -- RTC in 6 months  2. Vitamin B12 and D deficiencies -On B12 injection monthly, most recent B12 normal.  PLAN:  No clinical or examination evidence of iron deficiency anemia. CBC today completely normal, iron profile pending. She should return to clinic in 6 months with repeat labs.  She will continue B12 injection monthly.   INTERVAL HISTORY:   Katelyn Walker is here for a follow up of anemia.  She has been working on weight, lost 50 lbs since August.  She continues on [place] for weight loss and is very happy with the outcome so far. She has been feeling well. She has been taking monthly B12 injections. No new bleeding issues.  Last iron infusion in April 2022. All other systems were reviewed with the patient and are negative.  MEDICAL HISTORY:  Past Medical History:   Diagnosis Date   Back pain    Dyspnea    Elevated blood pressure reading without diagnosis of hypertension    Esophageal ulcer    GERD (gastroesophageal reflux disease)    Hot flashes    Hypothyroid    Joint pain    Sciatica, left side    Vitamin B 12 deficiency    Vitamin D deficiency     SURGICAL HISTORY: Past Surgical History:  Procedure Laterality Date   77 HOUR PH STUDY N/A 12/13/2021   Procedure: 24 HOUR PH STUDY;  Surgeon: Shellia Cleverly, DO;  Location: [facility];  Service: Endoscopy;  Laterality: N/A;   ANKLE FRACTURE SURGERY Left 07/18/2021   DILATION AND CURETTAGE OF UTERUS     2011    ESOPHAGEAL MANOMETRY N/A 12/13/2021   Procedure: ESOPHAGEAL MANOMETRY (EM);  Surgeon: Shellia Cleverly, DO;  Location: [facility];  Service: Endoscopy;  Laterality: N/A;   OVARIAN CYST SURGERY     2009, 2013   [facility] IMPEDANCE STUDY N/A 12/13/2021   Procedure: PH IMPEDANCE STUDY;  Surgeon: Shellia Cleverly, DO;  Location: [facility];  Service: Endoscopy;  Laterality: N/A;    I have reviewed the social history and family history with the patient and they are unchanged from previous note.  ALLERGIES:  has No Known Allergies.  MEDICATIONS:  Current Outpatient Medications  Medication Sig Dispense Refill   Semaglutide-Weight Management (WEGOVY) 1.7 MG/0.75ML SOAJ Inject 1.7 mg into the skin once a week. 3 mL 0   buPROPion (WELLBUTRIN XL)  150 MG 24 hr tablet Take 1 tablet (150 mg total) by mouth daily. 30 tablet 0   cyanocobalamin (,VITAMIN B-12,) 1000 MCG/ML injection Inject 1 mL (1,000 mcg total) into the muscle every 30 (thirty) days. 10 mL 0   methylPREDNISolone (MEDROL DOSEPAK) 4 MG TBPK tablet Take following package directions 21 tablet 0   mupirocin ointment (BACTROBAN) 2 % Apply 1 application. topically 2 (two) times daily. 22 g 0   Norgestimate-Eth Estradiol (ESTARYLLA PO) Take 28 mg by mouth.     nystatin ointment (MYCOSTATIN) Apply 1 application. topically 2 (two)  times daily as needed. 30 g 0   omeprazole (PRILOSEC) 40 MG capsule Take 1 capsule (40 mg total) by mouth daily. 90 capsule 5   ondansetron (ZOFRAN) 4 MG tablet Take 1 tablet (4 mg total) by mouth every 8 (eight) hours as needed for nausea or vomiting. 20 tablet 0   SYRINGE/NEEDLE, DISP, 1 ML (B-D 1CC SLIP TIP SYR 25GX5/8") 25G X 5/8" 1 ML MISC Use to administer b12 injections 50 each 0   traZODone (DESYREL) 50 MG tablet Take 0.5-1 tablets (25-50 mg total) by mouth at bedtime as needed for sleep. 90 tablet 0   Vitamin D, Ergocalciferol, (DRISDOL) 1.25 MG (50000 UNIT) CAPS capsule Take 1 capsule (50,000 Units total) by mouth every 7 (seven) days. 8 capsule 0   No current facility-administered medications for this visit.    PHYSICAL EXAMINATION: ECOG PERFORMANCE STATUS: 1 - Symptomatic but completely ambulatory  There were no vitals filed for this visit.  Wt Readings from Last 3 Encounters:  05/08/22 191 lb (86.6 kg)  04/10/22 192 lb (87.1 kg)  03/19/22 194 lb (88 kg)     Physical Exam Constitutional:      Appearance: Normal appearance.  Cardiovascular:     Rate and Rhythm: Normal rate and regular rhythm.     Pulses: Normal pulses.     Heart sounds: Normal heart sounds.  Musculoskeletal:        General: No swelling or tenderness. Normal range of motion.     Cervical back: Normal range of motion and neck supple. No rigidity.  Lymphadenopathy:     Cervical: No cervical adenopathy.  Skin:    General: Skin is warm and dry.  Neurological:     Mental Status: She is alert.      LABORATORY DATA:  I have reviewed the data as listed    Latest Ref Rng & Units 03/22/2022    9:32 AM 01/16/2022    8:03 AM 10/20/2021    8:09 AM  CBC  WBC 4.0 - 10.5 K/uL 8.3  9.4  7.9   Hemoglobin 12.0 - 15.0 g/dL 78.2  95.6  21.3   Hematocrit 36.0 - 46.0 % 39.6  37.7  39.8   Platelets 150 - 400 K/uL 290  247  257         Latest Ref Rng & Units 10/02/2021    8:32 AM 01/30/2021    8:56 AM 08/31/2020     4:26 PM  CMP  Glucose 70 - 99 mg/dL 85  87  086   BUN 6 - 24 mg/dL 7  12  10    Creatinine 0.57 - 1.00 mg/dL  5.78  4.69   Sodium 134 - 144 mmol/L 140  138  141   Potassium 3.5 - 5.2 mmol/L 3.9  4.5  4.0   Chloride 96 - 106 mmol/L 98  100  101   CO2 20 - 29 mmol/L  24  23  24    Calcium 8.7 - 10.2 mg/dL 8.7  8.9  9.0   Total Protein 6.0 - 8.5 g/dL 6.8  6.4  7.1   Total Bilirubin 0.0 - 1.2 mg/dL 0.3   <0.2   Alkaline Phos 44 - 121 IU/L 96  90  97   AST 0 - 40 IU/L 13  13  12    ALT 0 - 32 IU/L 12  8  6     Age appropriate screening recommended. Labs reviewed.   RADIOGRAPHIC STUDIES: I have personally reviewed the radiological images as listed and agreed with the findings in the report.  No results found.   I spent 20 minutes in the care of this patient including history, physical exam, review of records, coordination of care and documentation No orders of the defined types were placed in this encounter.   <7.2 MD

## 2022-06-07 ENCOUNTER — Ambulatory Visit (INDEPENDENT_AMBULATORY_CARE_PROVIDER_SITE_OTHER): Payer: BC Managed Care – PPO | Admitting: Adult Health

## 2022-06-11 DIAGNOSIS — E663 Overweight: Secondary | ICD-10-CM | POA: Diagnosis not present

## 2022-06-11 DIAGNOSIS — E538 Deficiency of other specified B group vitamins: Secondary | ICD-10-CM | POA: Diagnosis not present

## 2022-06-11 DIAGNOSIS — E559 Vitamin D deficiency, unspecified: Secondary | ICD-10-CM | POA: Diagnosis not present

## 2022-06-11 DIAGNOSIS — D509 Iron deficiency anemia, unspecified: Secondary | ICD-10-CM | POA: Diagnosis not present

## 2022-06-12 ENCOUNTER — Encounter: Payer: Self-pay | Admitting: Hematology and Oncology

## 2022-06-12 ENCOUNTER — Other Ambulatory Visit: Payer: Self-pay | Admitting: *Deleted

## 2022-06-12 DIAGNOSIS — E538 Deficiency of other specified B group vitamins: Secondary | ICD-10-CM

## 2022-06-12 MED ORDER — "BD SYRINGE/NEEDLE SLIP TIP 25G X 5/8"" 1 ML MISC": 0 refills | Status: DC

## 2022-06-12 MED ORDER — CYANOCOBALAMIN 1000 MCG/ML IJ SOLN: 1000.0000 ug | INTRAMUSCULAR | 0 refills | Status: DC

## 2022-06-13 DIAGNOSIS — Z309 Encounter for contraceptive management, unspecified: Secondary | ICD-10-CM | POA: Diagnosis not present

## 2022-07-11 ENCOUNTER — Other Ambulatory Visit (INDEPENDENT_AMBULATORY_CARE_PROVIDER_SITE_OTHER): Payer: Self-pay | Admitting: Adult Health

## 2022-07-11 DIAGNOSIS — E559 Vitamin D deficiency, unspecified: Secondary | ICD-10-CM

## 2022-07-17 ENCOUNTER — Encounter: Payer: Self-pay | Admitting: Hematology and Oncology

## 2022-07-25 ENCOUNTER — Encounter (INDEPENDENT_AMBULATORY_CARE_PROVIDER_SITE_OTHER): Payer: Self-pay

## 2022-07-30 DIAGNOSIS — R7303 Prediabetes: Secondary | ICD-10-CM | POA: Diagnosis not present

## 2022-07-30 DIAGNOSIS — E538 Deficiency of other specified B group vitamins: Secondary | ICD-10-CM | POA: Diagnosis not present

## 2022-07-30 DIAGNOSIS — D508 Other iron deficiency anemias: Secondary | ICD-10-CM | POA: Diagnosis not present

## 2022-08-14 ENCOUNTER — Encounter: Payer: Self-pay | Admitting: Hematology and Oncology

## 2022-08-29 ENCOUNTER — Encounter: Payer: Self-pay | Admitting: *Deleted

## 2022-08-29 DIAGNOSIS — R7303 Prediabetes: Secondary | ICD-10-CM | POA: Diagnosis not present

## 2022-08-29 DIAGNOSIS — E538 Deficiency of other specified B group vitamins: Secondary | ICD-10-CM | POA: Diagnosis not present

## 2022-08-29 DIAGNOSIS — E669 Obesity, unspecified: Secondary | ICD-10-CM | POA: Diagnosis not present

## 2022-08-29 DIAGNOSIS — D508 Other iron deficiency anemias: Secondary | ICD-10-CM | POA: Diagnosis not present

## 2022-09-07 DIAGNOSIS — R5383 Other fatigue: Secondary | ICD-10-CM | POA: Diagnosis not present

## 2022-09-07 DIAGNOSIS — J029 Acute pharyngitis, unspecified: Secondary | ICD-10-CM | POA: Diagnosis not present

## 2022-10-01 ENCOUNTER — Encounter: Payer: Self-pay | Admitting: Hematology and Oncology

## 2022-10-01 ENCOUNTER — Other Ambulatory Visit: Payer: Self-pay | Admitting: *Deleted

## 2022-10-02 ENCOUNTER — Encounter: Payer: Self-pay | Admitting: Hematology and Oncology

## 2022-10-02 DIAGNOSIS — D508 Other iron deficiency anemias: Secondary | ICD-10-CM | POA: Diagnosis not present

## 2022-10-02 DIAGNOSIS — E538 Deficiency of other specified B group vitamins: Secondary | ICD-10-CM | POA: Diagnosis not present

## 2022-10-02 DIAGNOSIS — R7303 Prediabetes: Secondary | ICD-10-CM | POA: Diagnosis not present

## 2022-10-04 DIAGNOSIS — E611 Iron deficiency: Secondary | ICD-10-CM | POA: Diagnosis not present

## 2022-10-04 DIAGNOSIS — E538 Deficiency of other specified B group vitamins: Secondary | ICD-10-CM | POA: Diagnosis not present

## 2022-10-04 DIAGNOSIS — D509 Iron deficiency anemia, unspecified: Secondary | ICD-10-CM | POA: Diagnosis not present

## 2022-10-09 ENCOUNTER — Inpatient Hospital Stay: Payer: BC Managed Care – PPO | Attending: Hematology and Oncology | Admitting: Hematology and Oncology

## 2022-10-09 ENCOUNTER — Encounter: Payer: Self-pay | Admitting: Hematology and Oncology

## 2022-10-09 VITALS — BP 144/94 | HR 51 | Temp 98.1°F | Resp 16 | Ht 67.0 in | Wt 192.4 lb

## 2022-10-09 DIAGNOSIS — E538 Deficiency of other specified B group vitamins: Secondary | ICD-10-CM | POA: Diagnosis not present

## 2022-10-09 DIAGNOSIS — D509 Iron deficiency anemia, unspecified: Secondary | ICD-10-CM | POA: Insufficient documentation

## 2022-10-09 DIAGNOSIS — E559 Vitamin D deficiency, unspecified: Secondary | ICD-10-CM | POA: Diagnosis not present

## 2022-10-09 DIAGNOSIS — E611 Iron deficiency: Secondary | ICD-10-CM

## 2022-10-09 MED ORDER — CYANOCOBALAMIN 1000 MCG/ML IJ SOLN: 1000.0000 ug | INTRAMUSCULAR | 0 refills | Status: AC

## 2022-10-09 MED ORDER — "BD SYRINGE/NEEDLE SLIP TIP 25G X 5/8"" 1 ML MISC": 0 refills | Status: AC

## 2022-10-09 NOTE — Progress Notes (Signed)
[facility]   Telephone:(336) [phone] Fax:(336) [phone]   Clinic Follow up Note   Patient Care Team: Laurann Montana, MD as PCP - General (Family Medicine)  Date of Service:  10/09/2022  CHIEF COMPLAINT: f/u of iron deficiency anemia  CURRENT THERAPY:  B12  ASSESSMENT & PLAN:  Katelyn Walker is a 51 y.o. female with   1. Iron Deficiency Anemia  -presented with fatigue and lightheadedness.  --Lab work 01/2021 showed low iron and ferritin levels, hgb WNL. Initially referred to Dr. Al Pimple 02/2021 --she tried oral iron but had no significant improvement. --last colonoscopy in 2019 was benign with some internal hemorrhoids. She is on birth control, periods are regular and not heavy. Katelyn Walker disease work up was negative, I still suspect she has iron absorption issue likely from hiatal hernia  --she received 5 doses of IV venofer in 03/2021.  --endoscopy 09/21/21 with Dr. Barron Alvine showed hiatal hernia, gastric biopsies showed only mild chronic inflammation. -- She has been feeling well, CBC from today shows hemoglobin of 13 g/dl, iron profile pending. -- No evidence of IDA today on her most recent labs.  She got these at [facility].  Ferritin at 84 and hemoglobin normal around 13.5 g/dL.  At this time I do not believe there is any further indication for iron supplementation. -- RTC in 6 months  2. Vitamin B12 and D deficiencies -On B12 injection monthly, most recent B12 normal.  I encouraged her to continue B12 injections monthly at this time.  PLAN:  No clinical or examination evidence of iron deficiency anemia. Most send labs from [facility] unremarkable. She should return to clinic in 6 months with repeat labs.  She will continue B12 injection monthly. For her fatigue, I wonder if this is because of her drastic amount of weight loss.  I encouraged her to get back into routine exercise. I have applauded her on the weight loss.  INTERVAL HISTORY:   Katelyn Walker is  here for a follow up of anemia.   She has been working on weight, lost more weight, came off of [place] about a month ago but she is able to maintain the weight. She denies any other health issues except for fatigue. She has been feeling well except for fatigue.  She has been taking monthly B12 injections. No new bleeding issues.  Last iron infusion in April 2022. All other systems were reviewed with the patient and are negative.  MEDICAL HISTORY:  Past Medical History:  Diagnosis Date   Back pain    Dyspnea    Elevated blood pressure reading without diagnosis of hypertension    Esophageal ulcer    GERD (gastroesophageal reflux disease)    Hot flashes    Hypothyroid    Joint pain    Sciatica, left side    Vitamin B 12 deficiency    Vitamin D deficiency     SURGICAL HISTORY: Past Surgical History:  Procedure Laterality Date   13 HOUR PH STUDY N/A 12/13/2021   Procedure: 24 HOUR PH STUDY;  Surgeon: Shellia Cleverly, DO;  [facility];  Service: Endoscopy;  Laterality: N/A;   ANKLE FRACTURE SURGERY Left 07/18/2021   DILATION AND CURETTAGE OF UTERUS     2011    ESOPHAGEAL MANOMETRY N/A 12/13/2021   Procedure: ESOPHAGEAL MANOMETRY (EM);  Surgeon: Shellia Cleverly, DO;  [facility];  Service: Endoscopy;  Laterality: N/A;   OVARIAN CYST SURGERY     2009, 2013   [facility]  IMPEDANCE STUDY N/A 12/13/2021   Procedure: [place] IMPEDANCE STUDY;  Surgeon: Lavena Bullion, DO;  [facility]: [facility];  Service: Endoscopy;  Laterality: N/A;    I have reviewed the social history and family history with the patient and they are unchanged from previous note.  ALLERGIES:  has No Known Allergies.  MEDICATIONS:  Current Outpatient Medications  Medication Sig Dispense Refill   Semaglutide-Weight Management (WEGOVY) 1.7 MG/0.75ML SOAJ Inject 1.7 mg into the skin once a week. 3 mL 0   buPROPion (WELLBUTRIN XL) 150 MG 24 hr tablet Take 1 tablet (150 mg total) by mouth daily. 30  tablet 0   cyanocobalamin (,VITAMIN B-12,) 1000 MCG/ML injection Inject 1 mL (1,000 mcg total) into the muscle every 30 (thirty) days. 10 mL 0   methylPREDNISolone (MEDROL DOSEPAK) 4 MG TBPK tablet Take following package directions 21 tablet 0   mupirocin ointment (BACTROBAN) 2 % Apply 1 application. topically 2 (two) times daily. 22 g 0   Norgestimate-Eth Estradiol (ESTARYLLA PO) Take 28 mg by mouth.     nystatin ointment (MYCOSTATIN) Apply 1 application. topically 2 (two) times daily as needed. 30 g 0   omeprazole (PRILOSEC) 40 MG capsule Take 1 capsule (40 mg total) by mouth daily. 90 capsule 5   ondansetron (ZOFRAN) 4 MG tablet Take 1 tablet (4 mg total) by mouth every 8 (eight) hours as needed for nausea or vomiting. 20 tablet 0   SYRINGE/NEEDLE, DISP, 1 ML (B-D 1CC SLIP TIP SYR 25GX5/8") 25G X 5/8" 1 ML MISC Use to administer b12 injections 50 each 0   traZODone (DESYREL) 50 MG tablet Take 0.5-1 tablets (25-50 mg total) by mouth at bedtime as needed for sleep. 90 tablet 0   Vitamin D, Ergocalciferol, (DRISDOL) 1.25 MG (50000 UNIT) CAPS capsule Take 1 capsule (50,000 Units total) by mouth every 7 (seven) days. 8 capsule 0   No current facility-administered medications for this visit.    PHYSICAL EXAMINATION: ECOG PERFORMANCE STATUS: 1 - Symptomatic but completely ambulatory  Vitals:   10/09/22 1337  BP: (!) 144/94  Pulse: (!) 51  Resp: 16  Temp: 98.1 F (36.7 C)  SpO2: 100%    Wt Readings from Last 3 Encounters:  10/09/22 192 lb 6.4 oz (87.3 kg)  06/05/22 192 lb 4.8 oz (87.2 kg)  05/08/22 191 lb (86.6 kg)     Physical Exam Constitutional:      Appearance: Normal appearance.  Cardiovascular:     Rate and Rhythm: Normal rate and regular rhythm.     Pulses: Normal pulses.     Heart sounds: Normal heart sounds.  Musculoskeletal:        General: No swelling or tenderness. Normal range of motion.     Cervical back: Normal range of motion and neck supple. No rigidity.   Lymphadenopathy:     Cervical: No cervical adenopathy.  Skin:    General: Skin is warm and dry.  Neurological:     Mental Status: She is alert.      LABORATORY DATA:  I have reviewed the data as listed    Latest Ref Rng & Units 06/05/2022    8:35 AM 03/22/2022    9:32 AM 01/16/2022    8:03 AM  CBC  WBC 4.0 - 10.5 K/uL 7.7  8.3  9.4   Hemoglobin 12.0 - 15.0 g/dL 13.1  13.1  12.7   Hematocrit 36.0 - 46.0 % 38.9  39.6  37.7   Platelets 150 - 400 K/uL 280  290  247         Latest Ref Rng & Units 10/02/2021    8:32 AM 01/30/2021    8:56 AM 08/31/2020    4:26 PM  CMP  Glucose 70 - 99 mg/dL 85  87  631   BUN 6 - 24 mg/dL 7  12  10    Creatinine 0.57 - 1.00 mg/dL  4.97  0.26   Sodium 134 - 144 mmol/L 140  138  141   Potassium 3.5 - 5.2 mmol/L 3.9  4.5  4.0   Chloride 96 - 106 mmol/L 98  100  101   CO2 20 - 29 mmol/L 24  23  24    Calcium 8.7 - 10.2 mg/dL 8.7  8.9  9.0   Total Protein 6.0 - 8.5 g/dL 6.8  6.4  7.1   Total Bilirubin 0.0 - 1.2 mg/dL 0.3  3.78    Alkaline Phos 44 - 121 IU/L 96  90  97   AST 0 - 40 IU/L 13  13  12    ALT 0 - 32 IU/L 12  8  6     Age appropriate screening recommended. Labs reviewed.   RADIOGRAPHIC STUDIES: I have personally reviewed the radiological images as listed and agreed with the findings in the report.  No results found.   I spent 20 minutes in the care of this patient including history, physical exam, review of records, coordination of care and documentation No orders of the defined types were placed in this encounter.   <5.8 MD

## 2022-10-11 ENCOUNTER — Encounter: Payer: Self-pay | Admitting: Hematology and Oncology

## 2022-10-31 ENCOUNTER — Encounter: Payer: Self-pay | Admitting: Hematology and Oncology

## 2022-11-01 ENCOUNTER — Encounter: Payer: Self-pay | Admitting: Hematology and Oncology

## 2022-11-13 DIAGNOSIS — D508 Other iron deficiency anemias: Secondary | ICD-10-CM | POA: Diagnosis not present

## 2022-11-13 DIAGNOSIS — E8881 Metabolic syndrome: Secondary | ICD-10-CM | POA: Diagnosis not present

## 2022-11-13 DIAGNOSIS — E669 Obesity, unspecified: Secondary | ICD-10-CM | POA: Diagnosis not present

## 2022-11-13 DIAGNOSIS — E538 Deficiency of other specified B group vitamins: Secondary | ICD-10-CM | POA: Diagnosis not present

## 2022-12-05 ENCOUNTER — Ambulatory Visit: Payer: Self-pay | Admitting: Hematology and Oncology

## 2022-12-05 ENCOUNTER — Other Ambulatory Visit: Payer: Self-pay

## 2022-12-18 DIAGNOSIS — R632 Polyphagia: Secondary | ICD-10-CM | POA: Diagnosis not present

## 2022-12-18 DIAGNOSIS — E559 Vitamin D deficiency, unspecified: Secondary | ICD-10-CM | POA: Diagnosis not present

## 2022-12-18 DIAGNOSIS — E669 Obesity, unspecified: Secondary | ICD-10-CM | POA: Diagnosis not present

## 2023-01-28 DIAGNOSIS — R632 Polyphagia: Secondary | ICD-10-CM | POA: Diagnosis not present

## 2023-01-28 DIAGNOSIS — E559 Vitamin D deficiency, unspecified: Secondary | ICD-10-CM | POA: Diagnosis not present

## 2023-01-28 DIAGNOSIS — K449 Diaphragmatic hernia without obstruction or gangrene: Secondary | ICD-10-CM | POA: Diagnosis not present

## 2023-01-28 DIAGNOSIS — K219 Gastro-esophageal reflux disease without esophagitis: Secondary | ICD-10-CM | POA: Diagnosis not present

## 2023-02-03 ENCOUNTER — Other Ambulatory Visit: Payer: Self-pay | Admitting: Gastroenterology

## 2023-02-09 ENCOUNTER — Other Ambulatory Visit: Payer: Self-pay | Admitting: Gastroenterology

## 2023-02-25 DIAGNOSIS — E538 Deficiency of other specified B group vitamins: Secondary | ICD-10-CM | POA: Diagnosis not present

## 2023-02-25 DIAGNOSIS — E8881 Metabolic syndrome: Secondary | ICD-10-CM | POA: Diagnosis not present

## 2023-02-25 DIAGNOSIS — R632 Polyphagia: Secondary | ICD-10-CM | POA: Diagnosis not present

## 2023-03-08 ENCOUNTER — Encounter: Payer: Self-pay | Admitting: Hematology and Oncology

## 2023-03-25 ENCOUNTER — Ambulatory Visit: Payer: BC Managed Care – PPO | Admitting: Gastroenterology

## 2023-03-25 DIAGNOSIS — E8881 Metabolic syndrome: Secondary | ICD-10-CM | POA: Diagnosis not present

## 2023-03-25 DIAGNOSIS — M6281 Muscle weakness (generalized): Secondary | ICD-10-CM | POA: Diagnosis not present

## 2023-03-25 DIAGNOSIS — R632 Polyphagia: Secondary | ICD-10-CM | POA: Diagnosis not present

## 2023-03-25 DIAGNOSIS — E669 Obesity, unspecified: Secondary | ICD-10-CM | POA: Diagnosis not present

## 2023-04-09 DIAGNOSIS — Z13820 Encounter for screening for osteoporosis: Secondary | ICD-10-CM | POA: Diagnosis not present

## 2023-04-10 ENCOUNTER — Ambulatory Visit: Payer: Self-pay | Admitting: Hematology and Oncology

## 2023-04-10 ENCOUNTER — Other Ambulatory Visit: Payer: Self-pay

## 2023-04-22 DIAGNOSIS — E559 Vitamin D deficiency, unspecified: Secondary | ICD-10-CM | POA: Diagnosis not present

## 2023-04-22 DIAGNOSIS — R632 Polyphagia: Secondary | ICD-10-CM | POA: Diagnosis not present

## 2023-04-22 DIAGNOSIS — Z9189 Other specified personal risk factors, not elsewhere classified: Secondary | ICD-10-CM | POA: Diagnosis not present

## 2023-04-22 DIAGNOSIS — E8881 Metabolic syndrome: Secondary | ICD-10-CM | POA: Diagnosis not present

## 2023-05-01 ENCOUNTER — Other Ambulatory Visit: Payer: Self-pay | Admitting: Gastroenterology

## 2023-05-01 MED ORDER — OMEPRAZOLE 40 MG PO CPDR: 40.0000 mg | DELAYED_RELEASE_CAPSULE | Freq: Every day | ORAL | 0 refills | Status: DC

## 2023-05-03 ENCOUNTER — Encounter: Payer: Self-pay | Admitting: Hematology and Oncology

## 2023-05-08 DIAGNOSIS — E538 Deficiency of other specified B group vitamins: Secondary | ICD-10-CM | POA: Diagnosis not present

## 2023-05-08 DIAGNOSIS — E611 Iron deficiency: Secondary | ICD-10-CM | POA: Diagnosis not present

## 2023-05-10 ENCOUNTER — Encounter: Payer: Self-pay | Admitting: Gastroenterology

## 2023-05-10 ENCOUNTER — Encounter: Payer: Self-pay | Admitting: Hematology and Oncology

## 2023-05-10 ENCOUNTER — Ambulatory Visit: Payer: BC Managed Care – PPO | Admitting: Gastroenterology

## 2023-05-10 VITALS — BP 130/78 | HR 90 | Ht 67.0 in | Wt 210.0 lb

## 2023-05-10 DIAGNOSIS — Z860100 Personal history of colon polyps, unspecified: Secondary | ICD-10-CM

## 2023-05-10 DIAGNOSIS — K219 Gastro-esophageal reflux disease without esophagitis: Secondary | ICD-10-CM | POA: Diagnosis not present

## 2023-05-10 DIAGNOSIS — R110 Nausea: Secondary | ICD-10-CM

## 2023-05-10 MED ORDER — FAMOTIDINE 20 MG PO TABS: 20.0000 mg | ORAL_TABLET | Freq: Two times a day (BID) | ORAL | 3 refills | Status: DC

## 2023-05-10 MED ORDER — FAMOTIDINE 20 MG PO TABS: 20.0000 mg | ORAL_TABLET | Freq: Every day | ORAL | 3 refills | Status: DC

## 2023-05-10 NOTE — Patient Instructions (Addendum)
We have sent the following medications to your pharmacy for you to pick up at your convenience: Pepcid  _______________________________________________________  If your blood pressure at your visit was 140/90 or greater, please contact your primary care physician to follow up on this. _______________________________________________________  If you are age 53 or younger, your body mass index should be between 19-25. Your Body mass index is 32.89 kg/m. If this is out of the aformentioned range listed, please consider follow up with your Primary Care Provider.  ________________________________________________________ The [place] [facility] providers would like to encourage you to use [facility] to communicate with providers for non-urgent requests or questions.  Due to long hold times on the telephone, sending your provider a message by [facility] may be a faster and more efficient way to get a response.  Please allow 48 business hours for a response.  Please remember that this is for non-urgent requests.  Due to recent changes in healthcare laws, you may see the results of your imaging and laboratory studies on MyChart before your provider has had a chance to review them.  We understand that in some cases there may be results that are confusing or concerning to you. Not all laboratory results come back in the same time frame and the provider may be waiting for multiple results in order to interpret others.  Please give [name] 48 hours in order for your provider to thoroughly review all the results before contacting the office for clarification of your results.    Thank you for choosing me and [facility].  Vito Cirigliano, D.O.

## 2023-05-10 NOTE — Progress Notes (Unsigned)
Chief Complaint:    Nausea, abdominal pain  GI History: 52 year old female with history of hypothyroid, vitamin D deficiency, iron deficiency s/p IV iron infusions, follows in the GI clinic for reflux and dysphagia.  Was initially seen by me on 05/23/2021.   Previously followed with Dr. Kinnie Scales and prior to that with Dr. Randa Evens at [facility]: - Reflux started in 2008 during pregnancy.  Index symptoms of heartburn, regurgitation - 2010: Developed intermittent solid food dysphagia.  Was seen by Dr. Randa Evens at [facility].  Started omeprazole 20 mg/day with overall improvement - Esophagram 09/2009: Small HH, mild ring at GE junction - 2015: Omeprazole changed to Zantac due to PPI concerns, with immediate breakthrough and eventually changed back to PPI. - EGD (12/2015, Dr. Kinnie Scales): 6 cm hiatal hernia, esophageal stricture dilated with 17 mm Savary.  Inflammatory changes on esophageal stricture biopsies.  LA Grade D esophagitis.  Started omeprazole 40 mg/day - EGD 02/2016 (Dr. Kinnie Scales): Resolved erosive esophagitis, large 5 cm hiatal hernia, nonobstructing distal esophageal stricture.  Continued omeprazole 40 mg/day with relief of reflux sxs then tapered to 20 mg/day in 2018, but breakthrough at 20 mg/day -Colonoscopy 08/2018.  Internal hemorrhoids, diverticulosis, sigmoid polyp (benign path), rectal polyp x2 (tubular adenomas).  5-year repeat per patient - Hemorrhoid banding recommended in 2019 by Dr. Kinnie Scales, but postponed d/t Covid concerns   -05/23/2021: Evaluated in the GI clinic for intermittent solid food dysphagia and increasing reflux symptoms.  Index reflux symptoms of increased throat clearing, and nausea, chronic dry cough.  Takes omeprazole 40 mg/day and Tums prn breakthrough.  As breakthrough with any missed dose of omeprazole.  Sleeps with HOB elevated. - 05/2021: Normal/negative celiac panel - 09/21/2021: EGD:5 cm HH, normal Z-line, mild Schatzki's ring dilated with 20 mm Savary then fractured with  forceps.  Benign fundic gland polyps, normal duodenum -10/20/2021: Evaluation by Dr. Andrey Campanile.   - 12/13/2021: Esophageal Manometry: Ineffective esophageal motility.  80% failed swallows with absent peristalsis.  Only 20% of swallows normal/intact.  Normal relaxation and inadequate augmentation of contractile phase following multiple rapid swallows with 20% bolus clearance.  Hiatal hernia noted. - 12/05/2021: pH/impedance (off PPI): Normal esophageal acid exposure with percent time pH <4 of 3.3%.  [place] slightly elevated at 15.8.  Esophageal acid exposure abnormal in supine with elevated number of reflux events (60) and positive symptom correlation for heartburn based on SAP. - Recommended against antireflux surgery given ineffective esophageal motility     Maternal uncle with Esophageal CA. Father with colon polyps. No known family history of CRC, liver disease, pancreatic disease, or IBD    HPI:     Patient is a 52 y.o. female presenting to the Gastroenterology Clinic for evaluation of nausea and abdominal pain.  Last seen in the GI clinic by me on 01/26/2022.  Reflux otherwise well-controlled at that time with Prilosec 40 mg daily.  Had lost 50# with [facility], and has regained 10# since discontinuing the Rx 4 months ago. Still following with Dr. Earlene Plater and started on Zepbound (another GLP-1) 2 months ago.  She states post prandial nausea and abdominal pain with post prandial urgency. Typically within 10 mins of eating. LUQ cramping quality, that resolves with BM. Occurs with approx 70% of meals, and possibly more likely with oily foods.   Separately, due for repeat colonoscopy for ongoing polyp surveillance this year.  Separately, has had nocturnal breakthrough reflux. Takes omeprazole 40 mg QAM.   Following in the Hematology Clinic for hx of IDA,  and has f/u next week.    Review of systems:     No chest pain, no SOB, no fevers, no urinary sx   Past Medical History:  Diagnosis Date    Back pain    Dyspnea    Elevated blood pressure reading without diagnosis of hypertension    Esophageal ulcer    GERD (gastroesophageal reflux disease)    Hot flashes    Hypothyroid    Joint pain    Sciatica, left side    Vitamin B 12 deficiency    Vitamin D deficiency     Patient's surgical history, family medical history, social history, medications and allergies were all reviewed in Epic    Current Outpatient Medications  Medication Sig Dispense Refill   buPROPion (WELLBUTRIN XL) 150 MG 24 hr tablet Take 1 tablet (150 mg total) by mouth daily. 30 tablet 0   cyanocobalamin (VITAMIN B12) 1000 MCG/ML injection Inject 1 mL (1,000 mcg total) into the muscle every 30 (thirty) days. 10 mL 0   mupirocin ointment (BACTROBAN) 2 % Apply 1 application. topically 2 (two) times daily. 22 g 0   Norgestimate-Eth Estradiol (ESTARYLLA PO) Take 28 mg by mouth.     nystatin ointment (MYCOSTATIN) Apply 1 application. topically 2 (two) times daily as needed. 30 g 0   omeprazole (PRILOSEC) 40 MG capsule Take 1 capsule (40 mg total) by mouth daily. 30 capsule 0   Semaglutide-Weight Management (WEGOVY) 1.7 MG/0.75ML SOAJ Inject 1.7 mg into the skin once a week. 3 mL 0   SYRINGE/NEEDLE, DISP, 1 ML (B-D 1CC SLIP TIP SYR 25GX5/8") 25G X 5/8" 1 ML MISC Use to administer b12 injections 50 each 0   tirzepatide (ZEPBOUND) 7.5 MG/0.5ML Pen Inject 7.5mg  (0.84mL) under the skin once weekly.     traZODone (DESYREL) 50 MG tablet Take 0.5-1 tablets (25-50 mg total) by mouth at bedtime as needed for sleep. 90 tablet 0   Vitamin D, Ergocalciferol, (DRISDOL) 1.25 MG (50000 UNIT) CAPS capsule Take 1 capsule (50,000 Units total) by mouth every 7 (seven) days. 8 capsule 0   methylPREDNISolone (MEDROL DOSEPAK) 4 MG TBPK tablet Take following package directions 21 tablet 0   ondansetron (ZOFRAN) 4 MG tablet Take 1 tablet (4 mg total) by mouth every 8 (eight) hours as needed for nausea or vomiting. 20 tablet 0   No current  facility-administered medications for this visit.    Physical Exam:     BP 130/78   Pulse 90   Ht 5\' 7"  (1.702 m)   Wt 210 lb (95.3 kg)   BMI 32.89 kg/m   GENERAL:  Pleasant *** in NAD PSYCH: : Cooperative, normal affect EENT:  conjunctiva pink, mucous membranes moist, neck supple without masses CARDIAC:  RRR, ***murmur heard, no peripheral edema PULM: Normal respiratory effort, lungs CTA bilaterally, no wheezing ABDOMEN:  Nondistended, soft, nontender. No obvious masses, no hepatomegaly,  normal bowel sounds SKIN:  turgor, no lesions seen Musculoskeletal:  Normal muscle tone, normal strength NEURO: Alert and oriented x 3, no focal neurologic deficits   IMPRESSION and PLAN:    1)   Pepcid 20 mg qhs Med ADR. Will call if not improving [place] 08/2023            Shellia Cleverly ,DO, [facility] 05/10/2023, 8:47 AM

## 2023-05-20 ENCOUNTER — Inpatient Hospital Stay: Payer: BC Managed Care – PPO | Attending: Hematology and Oncology | Admitting: Hematology and Oncology

## 2023-05-20 VITALS — BP 138/85 | HR 77 | Temp 97.9°F | Resp 14 | Ht 67.0 in | Wt 209.8 lb

## 2023-05-20 DIAGNOSIS — D509 Iron deficiency anemia, unspecified: Secondary | ICD-10-CM | POA: Diagnosis not present

## 2023-05-20 DIAGNOSIS — E538 Deficiency of other specified B group vitamins: Secondary | ICD-10-CM

## 2023-05-20 DIAGNOSIS — E559 Vitamin D deficiency, unspecified: Secondary | ICD-10-CM | POA: Insufficient documentation

## 2023-05-20 DIAGNOSIS — E611 Iron deficiency: Secondary | ICD-10-CM

## 2023-05-20 NOTE — Progress Notes (Signed)
[facility]   [phone] Fax:(336) [phone]   Clinic Follow up Note   Patient Care Team: Laurann Montana, MD as PCP - General (Family Medicine)  Date of Service:  05/20/2023  CHIEF COMPLAINT: f/u of iron deficiency anemia  CURRENT THERAPY:  B12  ASSESSMENT & PLAN:  Katelyn Walker is a 52 y.o. female with   1. Iron Deficiency Anemia  -presented with fatigue and lightheadedness.  --Lab work 01/2021 showed low iron and ferritin levels, hgb WNL. Initially referred to Dr. Al Pimple 02/2021 --she tried oral iron but had no significant improvement. --last colonoscopy in 2019 was benign with some internal hemorrhoids. She is on birth control, periods are regular and not heavy. Katelyn Walker disease work up was negative, I still suspect she has iron absorption issue likely from hiatal hernia  --she received 5 doses of IV venofer in 03/2021.  --endoscopy 09/21/21 with Dr. Barron Alvine showed hiatal hernia, gastric biopsies showed only mild chronic inflammation. -- She has been feeling well, CBC from today shows hemoglobin of 13.6 g/dl, iron profile normal, ferritin is 49, previously 84. I recommended oral iron every other day. -- RTC in 6 months with repeat labs.  2. Vitamin B12 and D deficiencies -On B12 injection monthly, most recent B12 normal.  I encouraged her to continue B12 injections monthly at this time.  PLAN:  No clinical or examination evidence of iron deficiency anemia. Most recent labs from [facility] unremarkable. Consider oral iron every other day, ferritin at 49. Continue B12 monthly. She should return to clinic in 6 months with repeat labs.   I have applauded her on the weight loss.  INTERVAL HISTORY:   Katelyn Walker is here for a follow up of anemia.  Since her last visit here she is now on zepbound for weight loss.  She is also making changes to her lifestyle, eating healthy and staying active.  She is taking her B12 injections monthly.  She is  up-to-date with her age-appropriate cancer screening.  She denies any change in bowel habits, hematochezia or melena. Last iron infusion in April 2022. All other systems were reviewed with the patient and are negative.  MEDICAL HISTORY:  Past Medical History:  Diagnosis Date   Back pain    Dyspnea    Elevated blood pressure reading without diagnosis of hypertension    Esophageal ulcer    GERD (gastroesophageal reflux disease)    Hot flashes    Hypothyroid    Joint pain    Sciatica, left side    Vitamin B 12 deficiency    Vitamin D deficiency     SURGICAL HISTORY: Past Surgical History:  Procedure Laterality Date   17 HOUR PH STUDY N/A 12/13/2021   Procedure: 24 HOUR PH STUDY;  Surgeon: Shellia Cleverly, DO;  Location: [facility];  Service: Endoscopy;  Laterality: N/A;   ANKLE FRACTURE SURGERY Left 07/18/2021   DILATION AND CURETTAGE OF UTERUS     2011    ESOPHAGEAL MANOMETRY N/A 12/13/2021   Procedure: ESOPHAGEAL MANOMETRY (EM);  Surgeon: Shellia Cleverly, DO;  Location: [facility];  Service: Endoscopy;  Laterality: N/A;   OVARIAN CYST SURGERY     2009, 2013   [facility] IMPEDANCE STUDY N/A 12/13/2021   Procedure: PH IMPEDANCE STUDY;  Surgeon: Shellia Cleverly, DO;  Location: [facility];  Service: Endoscopy;  Laterality: N/A;    I have reviewed the social history and family history with the patient and they are unchanged from previous note.  ALLERGIES:  has No Known Allergies.  MEDICATIONS:  Current Outpatient Medications  Medication Sig Dispense Refill   buPROPion (WELLBUTRIN XL) 150 MG 24 hr tablet Take 1 tablet (150 mg total) by mouth daily. 30 tablet 0   cyanocobalamin (VITAMIN B12) 1000 MCG/ML injection Inject 1 mL (1,000 mcg total) into the muscle every 30 (thirty) days. 10 mL 0   famotidine (PEPCID) 20 MG tablet Take 1 tablet (20 mg total) by mouth at bedtime. 90 tablet 3   mupirocin ointment (BACTROBAN) 2 % Apply 1 application. topically 2 (two) times daily.  22 g 0   Norgestimate-Eth Estradiol (ESTARYLLA PO) Take 28 mg by mouth.     nystatin ointment (MYCOSTATIN) Apply 1 application. topically 2 (two) times daily as needed. 30 g 0   omeprazole (PRILOSEC) 40 MG capsule Take 1 capsule (40 mg total) by mouth daily. 30 capsule 0   SYRINGE/NEEDLE, DISP, 1 ML (B-D 1CC SLIP TIP SYR 25GX5/8") 25G X 5/8" 1 ML MISC Use to administer b12 injections 50 each 0   tirzepatide (ZEPBOUND) 7.5 MG/0.5ML Pen Inject 7.5mg  (0.3mL) under the skin once weekly.     traZODone (DESYREL) 50 MG tablet Take 0.5-1 tablets (25-50 mg total) by mouth at bedtime as needed for sleep. 90 tablet 0   Vitamin D, Ergocalciferol, (DRISDOL) 1.25 MG (50000 UNIT) CAPS capsule Take 1 capsule (50,000 Units total) by mouth every 7 (seven) days. 8 capsule 0   No current facility-administered medications for this visit.    PHYSICAL EXAMINATION: ECOG PERFORMANCE STATUS: 1 - Symptomatic but completely ambulatory  Vitals:   05/20/23 0911  BP: 138/85  Pulse: 77  Resp: 14  Temp: 97.9 F (36.6 C)  SpO2: 100%    Wt Readings from Last 3 Encounters:  05/20/23 209 lb 12.8 oz (95.2 kg)  05/10/23 210 lb (95.3 kg)  10/09/22 192 lb 6.4 oz (87.3 kg)     Physical Exam Constitutional:      Appearance: Normal appearance.  Cardiovascular:     Rate and Rhythm: Normal rate and regular rhythm.     Pulses: Normal pulses.     Heart sounds: Normal heart sounds.  Musculoskeletal:        General: No swelling or tenderness. Normal range of motion.     Cervical back: Normal range of motion and neck supple. No rigidity.  Lymphadenopathy:     Cervical: No cervical adenopathy.  Skin:    General: Skin is warm and dry.  Neurological:     Mental Status: She is alert.      LABORATORY DATA:  I have reviewed the data as listed    Latest Ref Rng & Units 06/05/2022    8:35 AM 03/22/2022    9:32 AM 01/16/2022    8:03 AM  CBC  WBC 4.0 - 10.5 K/uL 7.7  8.3  9.4   Hemoglobin 12.0 - 15.0 g/dL 82.9  56.2   13.0   Hematocrit 36.0 - 46.0 % 38.9  39.6  37.7   Platelets 150 - 400 K/uL 280  290  247         Latest Ref Rng & Units 10/02/2021    8:32 AM 01/30/2021    8:56 AM 08/31/2020    4:26 PM  CMP  Glucose 70 - 99 mg/dL 85  87  865   BUN 6 - 24 mg/dL 7  12  10    Creatinine 0.57 - 1.00 mg/dL 7.84  6.96  2.95   Sodium 134 - 144 mmol/L  140  138  141   Potassium 3.5 - 5.2 mmol/L 3.9  4.5  4.0   Chloride 96 - 106 mmol/L 98  100  101   CO2 20 - 29 mmol/L 24  23  24    Calcium 8.7 - 10.2 mg/dL 8.7  8.9  9.0   Total Protein 6.0 - 8.5 g/dL 6.8  6.4  7.1   Total Bilirubin 0.0 - 1.2 mg/dL 0.3  <8.4  <1.3   Alkaline Phos 44 - 121 IU/L 96  90  97   AST 0 - 40 IU/L 13  13  12    ALT 0 - 32 IU/L 12  8  6     Age appropriate screening recommended. Labs reviewed.   RADIOGRAPHIC STUDIES: I have personally reviewed the radiological images as listed and agreed with the findings in the report.  No results found.   I spent 20 minutes in the care of this patient including history, physical exam, review of records, coordination of care and documentation No orders of the defined types were placed in this encounter.   Rachel Moulds MD

## 2023-05-20 NOTE — Progress Notes (Signed)
Faxed CBCD/Iron&IB/Ferritin/VitB12 lab orders to [id number], [phone] with receipt confirmation per Pt request.

## 2023-05-21 ENCOUNTER — Encounter: Payer: Self-pay | Admitting: Hematology and Oncology

## 2023-05-28 ENCOUNTER — Other Ambulatory Visit: Payer: Self-pay | Admitting: Gastroenterology

## 2023-05-28 MED ORDER — OMEPRAZOLE 40 MG PO CPDR: 40.0000 mg | DELAYED_RELEASE_CAPSULE | Freq: Every day | ORAL | 1 refills | Status: DC

## 2023-06-17 DIAGNOSIS — R030 Elevated blood-pressure reading, without diagnosis of hypertension: Secondary | ICD-10-CM | POA: Diagnosis not present

## 2023-06-17 DIAGNOSIS — R7301 Impaired fasting glucose: Secondary | ICD-10-CM | POA: Diagnosis not present

## 2023-06-17 DIAGNOSIS — R632 Polyphagia: Secondary | ICD-10-CM | POA: Diagnosis not present

## 2023-06-17 DIAGNOSIS — E559 Vitamin D deficiency, unspecified: Secondary | ICD-10-CM | POA: Diagnosis not present

## 2023-06-24 DIAGNOSIS — E8881 Metabolic syndrome: Secondary | ICD-10-CM | POA: Diagnosis not present

## 2023-06-24 DIAGNOSIS — E538 Deficiency of other specified B group vitamins: Secondary | ICD-10-CM | POA: Diagnosis not present

## 2023-06-24 DIAGNOSIS — R632 Polyphagia: Secondary | ICD-10-CM | POA: Diagnosis not present

## 2023-07-29 ENCOUNTER — Encounter: Payer: Self-pay | Admitting: Hematology and Oncology

## 2023-07-30 ENCOUNTER — Encounter: Payer: Self-pay | Admitting: Hematology and Oncology

## 2023-08-05 ENCOUNTER — Ambulatory Visit (AMBULATORY_SURGERY_CENTER): Payer: BC Managed Care – PPO

## 2023-08-05 ENCOUNTER — Encounter: Payer: Self-pay | Admitting: Hematology and Oncology

## 2023-08-05 ENCOUNTER — Encounter: Payer: Self-pay | Admitting: Gastroenterology

## 2023-08-05 VITALS — Ht 66.5 in | Wt 207.0 lb

## 2023-08-05 DIAGNOSIS — Z86010 Personal history of colonic polyps: Secondary | ICD-10-CM

## 2023-08-05 MED ORDER — NA SULFATE-K SULFATE-MG SULF 17.5-3.13-1.6 GM/177ML PO SOLN
1.0000 | Freq: Once | ORAL | 0 refills | Status: AC
Start: 2023-08-05 — End: 2023-08-05

## 2023-08-05 NOTE — Progress Notes (Signed)
No egg or soy allergy known to patient  No issues known to pt with past sedation with any surgeries or procedures Patient denies ever being told they had issues or difficulty with intubation  No FH of Malignant Hyperthermia Pt is not on diet pills Pt is not on  home 02  Pt is not on blood thinners  Pt denies issues with constipation takes miralax No A fib or A flutter Have any cardiac testing pending--no Pt can ambulate independently Pt denies use of chewing tobacco Discussed diabetic I weight loss medication holds Discussed NSAID holds Checked BMI Pt instructed to use [employer] or GoodRx for a price reduction on prep  Patient's chart reviewed by Cathlyn Parsons CNRA prior to previsit and patient appropriate for the [facility].  Pre visit completed and red dot placed by patient's name on their procedure day (on provider's schedule).

## 2023-08-07 ENCOUNTER — Encounter: Payer: Self-pay | Admitting: Gastroenterology

## 2023-08-09 ENCOUNTER — Encounter: Payer: Self-pay | Admitting: Gastroenterology

## 2023-08-26 ENCOUNTER — Encounter: Payer: BC Managed Care – PPO | Admitting: Gastroenterology

## 2023-08-29 DIAGNOSIS — R632 Polyphagia: Secondary | ICD-10-CM | POA: Diagnosis not present

## 2023-08-29 DIAGNOSIS — D509 Iron deficiency anemia, unspecified: Secondary | ICD-10-CM | POA: Diagnosis not present

## 2023-08-29 DIAGNOSIS — E782 Mixed hyperlipidemia: Secondary | ICD-10-CM | POA: Diagnosis not present

## 2023-08-29 DIAGNOSIS — E538 Deficiency of other specified B group vitamins: Secondary | ICD-10-CM | POA: Diagnosis not present

## 2023-08-29 DIAGNOSIS — Z9189 Other specified personal risk factors, not elsewhere classified: Secondary | ICD-10-CM | POA: Diagnosis not present

## 2023-08-30 ENCOUNTER — Encounter: Payer: Self-pay | Admitting: Family Medicine

## 2023-09-10 ENCOUNTER — Encounter: Payer: Self-pay | Admitting: Hematology and Oncology

## 2023-09-10 ENCOUNTER — Other Ambulatory Visit (HOSPITAL_COMMUNITY): Payer: Self-pay

## 2023-09-10 MED ORDER — ZEPBOUND 10 MG/0.5ML ~~LOC~~ SOAJ
SUBCUTANEOUS | 3 refills | Status: DC
Start: 2023-09-10 — End: 2024-10-05
  Filled 2023-09-10: qty 2, 28d supply, fill #0

## 2023-09-23 DIAGNOSIS — Z1231 Encounter for screening mammogram for malignant neoplasm of breast: Secondary | ICD-10-CM | POA: Diagnosis not present

## 2023-09-23 DIAGNOSIS — Z6832 Body mass index (BMI) 32.0-32.9, adult: Secondary | ICD-10-CM | POA: Diagnosis not present

## 2023-09-23 DIAGNOSIS — Z01419 Encounter for gynecological examination (general) (routine) without abnormal findings: Secondary | ICD-10-CM | POA: Diagnosis not present

## 2023-09-30 DIAGNOSIS — R790 Abnormal level of blood mineral: Secondary | ICD-10-CM | POA: Diagnosis not present

## 2023-09-30 DIAGNOSIS — R632 Polyphagia: Secondary | ICD-10-CM | POA: Diagnosis not present

## 2023-09-30 DIAGNOSIS — Z6831 Body mass index (BMI) 31.0-31.9, adult: Secondary | ICD-10-CM | POA: Diagnosis not present

## 2023-09-30 DIAGNOSIS — E559 Vitamin D deficiency, unspecified: Secondary | ICD-10-CM | POA: Diagnosis not present

## 2023-09-30 DIAGNOSIS — E669 Obesity, unspecified: Secondary | ICD-10-CM | POA: Diagnosis not present

## 2023-09-30 DIAGNOSIS — E538 Deficiency of other specified B group vitamins: Secondary | ICD-10-CM | POA: Diagnosis not present

## 2023-10-04 DIAGNOSIS — Z309 Encounter for contraceptive management, unspecified: Secondary | ICD-10-CM | POA: Diagnosis not present

## 2023-10-24 DIAGNOSIS — E559 Vitamin D deficiency, unspecified: Secondary | ICD-10-CM | POA: Diagnosis not present

## 2023-10-24 DIAGNOSIS — E669 Obesity, unspecified: Secondary | ICD-10-CM | POA: Diagnosis not present

## 2023-10-24 DIAGNOSIS — Z1331 Encounter for screening for depression: Secondary | ICD-10-CM | POA: Diagnosis not present

## 2023-10-24 DIAGNOSIS — E538 Deficiency of other specified B group vitamins: Secondary | ICD-10-CM | POA: Diagnosis not present

## 2023-10-24 DIAGNOSIS — R632 Polyphagia: Secondary | ICD-10-CM | POA: Diagnosis not present

## 2023-10-28 ENCOUNTER — Encounter: Payer: Self-pay | Admitting: Hematology and Oncology

## 2023-10-29 ENCOUNTER — Encounter: Payer: Self-pay | Admitting: Hematology and Oncology

## 2023-10-29 DIAGNOSIS — E538 Deficiency of other specified B group vitamins: Secondary | ICD-10-CM | POA: Diagnosis not present

## 2023-10-29 DIAGNOSIS — E611 Iron deficiency: Secondary | ICD-10-CM | POA: Diagnosis not present

## 2023-11-05 ENCOUNTER — Telehealth: Payer: Self-pay | Admitting: Hematology and Oncology

## 2023-11-05 ENCOUNTER — Encounter: Payer: Self-pay | Admitting: Hematology and Oncology

## 2023-11-05 ENCOUNTER — Inpatient Hospital Stay: Payer: BC Managed Care – PPO | Attending: Hematology and Oncology | Admitting: Hematology and Oncology

## 2023-11-05 VITALS — BP 135/80 | HR 99 | Temp 97.6°F | Resp 16 | Wt 192.7 lb

## 2023-11-05 DIAGNOSIS — E538 Deficiency of other specified B group vitamins: Secondary | ICD-10-CM

## 2023-11-05 DIAGNOSIS — D500 Iron deficiency anemia secondary to blood loss (chronic): Secondary | ICD-10-CM | POA: Diagnosis not present

## 2023-11-05 DIAGNOSIS — Z79899 Other long term (current) drug therapy: Secondary | ICD-10-CM | POA: Diagnosis not present

## 2023-11-05 DIAGNOSIS — D509 Iron deficiency anemia, unspecified: Secondary | ICD-10-CM | POA: Insufficient documentation

## 2023-11-05 NOTE — Progress Notes (Signed)
[facility]   Telephone:(336) [phone] Fax:(336) [phone]   Clinic Follow up Note   Patient Care Team: Laurann Montana, MD as PCP - General (Family Medicine)  Date of Service:  11/05/2023  CHIEF COMPLAINT: f/u of iron deficiency anemia  CURRENT THERAPY:  B12, iron replacement.  ASSESSMENT & PLAN:   Perimenopause Experiencing episodes of fatigue, memory issues, and word-finding difficulty. Discussed the potential benefits and risks of hormone replacement therapy (HRT) including risk of breast cancer. Patient is considering starting HRT as recommended by her OB/GYN. -Start HRT at the lowest dose possible for the shortest amount of time. -Continue regular mammograms.  Iron Deficiency Slightly low iron saturation. Patient reports some stomach upset with over-the-counter iron supplements. -Take over-the-counter iron supplement every other day. -Consider supplements with vitamin C or taking with a glass of orange juice to enhance absorption. -Hemoglobin from the most recent labs is normal.  Vitamin B12 Deficiency Currently managed with monthly B12 injections. -Continue monthly B12 injections. -Most recent B12 level was 394  Weight Loss Patient has lost significant weight and is currently taking Zepbound. Discussed the importance of maintaining a consistent exercise routine and considering a multivitamin due to significant weight loss. -Continue Zepbound. -Consider adding a daily multivitamin. -Continue regular exercise, aiming to burn 300-500 calories per day.  INTERVAL HISTORY:   Katelyn Walker is here for a follow up of anemia,iron deficiency and B12. Discussed the use of AI scribe software for clinical note transcription with the patient, who gave verbal consent to proceed.  History of Present Illness    The patient, with a history of iron and B12 deficiencies, presents with episodes of fatigue and memory issues. She describes the fatigue as episodic, not  lasting the whole day, and sometimes feeling like she could "just lay down." The memory issues are characterized by difficulty finding words and moments of fogginess, affecting her concentration. She denies any significant changes in her daily activities or diet.  The patient is also on a weight loss journey, having lost significant weight with the help of a weight loss medication. She reports some concerns about loose skin due to the weight loss. She is considering starting hormone replacement therapy (HRT) as recommended by her OB/GYN to help with her symptoms, which she attributes to perimenopause. She has not started the HRT yet.  The patient is also taking iron supplements for her iron deficiency, but she reports that it upsets her stomach. She is also taking B12 injections for her B12 deficiency. She reports that she is doing well with the B12 injections.  Last iron infusion in April 2022. All other systems were reviewed with the patient and are negative.  MEDICAL HISTORY:  Past Medical History:  Diagnosis Date   Anemia    Back pain    Dyspnea    Elevated blood pressure reading without diagnosis of hypertension    Esophageal ulcer    GERD (gastroesophageal reflux disease)    Hot flashes    Hypothyroid    Joint pain    Sciatica, left side    Vitamin B 12 deficiency    Vitamin D deficiency     SURGICAL HISTORY: Past Surgical History:  Procedure Laterality Date   36 HOUR PH STUDY N/A 12/13/2021   Procedure: 24 HOUR PH STUDY;  Surgeon: Shellia Cleverly, DO;  Location: [facility];  Service: Endoscopy;  Laterality: N/A;   ANKLE FRACTURE SURGERY Left 07/18/2021   DILATION AND CURETTAGE OF UTERUS  2011    ESOPHAGEAL MANOMETRY N/A 12/13/2021   Procedure: ESOPHAGEAL MANOMETRY (EM);  Surgeon: Shellia Cleverly, DO;  Location: [facility];  Service: Endoscopy;  Laterality: N/A;   L4/L5 microdisc Left 2019   OVARIAN CYST SURGERY     2009, 2013   [facility] IMPEDANCE STUDY N/A  12/13/2021   Procedure: PH IMPEDANCE STUDY;  Surgeon: Shellia Cleverly, DO;  Location: [facility];  Service: Endoscopy;  Laterality: N/A;    I have reviewed the social history and family history with the patient and they are unchanged from previous note.  ALLERGIES:  has No Known Allergies.  MEDICATIONS:  Current Outpatient Medications  Medication Sig Dispense Refill   buPROPion (WELLBUTRIN XL) 150 MG 24 hr tablet Take 1 tablet (150 mg total) by mouth daily. 30 tablet 0   Cholecalciferol (VITAMIN D3) 50 MCG (2000 UT) capsule Take by mouth.     cyanocobalamin (VITAMIN B12) 1000 MCG/ML injection Inject 1 mL (1,000 mcg total) into the muscle every 30 (thirty) days. 10 mL 0   famotidine (PEPCID) 20 MG tablet Take 1 tablet (20 mg total) by mouth at bedtime. 90 tablet 3   mupirocin ointment (BACTROBAN) 2 % Apply 1 application. topically 2 (two) times daily. 22 g 0   Norethindrone Acetate-Ethinyl Estrad-FE (BLISOVI 24 FE) 1-20 MG-MCG(24) tablet 1 tablet Orally Once a day     Norgestimate-Eth Estradiol (ESTARYLLA PO) Take 28 mg by mouth.     nystatin ointment (MYCOSTATIN) Apply 1 application. topically 2 (two) times daily as needed. 30 g 0   omeprazole (PRILOSEC) 40 MG capsule Take 1 capsule (40 mg total) by mouth daily. 90 capsule 1   ondansetron (ZOFRAN) 4 MG tablet Take 4 mg by mouth every 6 (six) hours as needed.     phentermine (ADIPEX-P) 37.5 MG tablet Take 18.75-37.5 mg by mouth daily.     SYRINGE/NEEDLE, DISP, 1 ML (B-D 1CC SLIP TIP SYR 25GX5/8") 25G X 5/8" 1 ML MISC Use to administer b12 injections 50 each 0   tirzepatide (ZEPBOUND) 10 MG/0.5ML Pen Inject 10 mg into the skin once a week.     tirzepatide (ZEPBOUND) 10 MG/0.5ML Pen Inject 10mg  (0.49mL) under the skin once weekly. 2 mL 3   traZODone (DESYREL) 50 MG tablet Take 0.5-1 tablets (25-50 mg total) by mouth at bedtime as needed for sleep. 90 tablet 0   Vitamin D, Ergocalciferol, (DRISDOL) 1.25 MG (50000 UNIT) CAPS capsule Take 1  capsule (50,000 Units total) by mouth every 7 (seven) days. 8 capsule 0   No current facility-administered medications for this visit.    PHYSICAL EXAMINATION: ECOG PERFORMANCE STATUS: 1 - Symptomatic but completely ambulatory  Vitals:   11/05/23 0939  BP: 135/80  Pulse: 99  Resp: 16  Temp: 97.6 F (36.4 C)  SpO2: 99%    Wt Readings from Last 3 Encounters:  11/05/23 192 lb 11.2 oz (87.4 kg)  08/05/23 207 lb (93.9 kg)  05/20/23 209 lb 12.8 oz (95.2 kg)     Physical Exam Constitutional:      Appearance: Normal appearance.  Cardiovascular:     Rate and Rhythm: Normal rate and regular rhythm.     Pulses: Normal pulses.     Heart sounds: Normal heart sounds.  Musculoskeletal:        General: No swelling or tenderness. Normal range of motion.     Cervical back: Normal range of motion and neck supple. No rigidity.  Lymphadenopathy:     Cervical: No cervical adenopathy.  Skin:    General: Skin is warm and dry.  Neurological:     Mental Status: She is alert.      LABORATORY DATA:  I have reviewed the data as listed    Latest Ref Rng & Units 06/05/2022    8:35 AM 03/22/2022    9:32 AM 01/16/2022    8:03 AM  CBC  WBC 4.0 - 10.5 K/uL 7.7  8.3  9.4   Hemoglobin 12.0 - 15.0 g/dL 40.9  81.1  91.4   Hematocrit 36.0 - 46.0 % 38.9  39.6  37.7   Platelets 150 - 400 K/uL 280  290  247         Latest Ref Rng & Units 10/02/2021    8:32 AM 01/30/2021    8:56 AM 08/31/2020    4:26 PM  CMP  Glucose 70 - 99 mg/dL 85  87  782   BUN 6 - 24 mg/dL 7  12  10    Creatinine 0.57 - 1.00 mg/dL 9.56  2.13  0.86   Sodium 134 - 144 mmol/L 140  138  141   Potassium 3.5 - 5.2 mmol/L 3.9  4.5  4.0   Chloride 96 - 106 mmol/L 98  100  101   CO2 20 - 29 mmol/L 24  23  24    Calcium 8.7 - 10.2 mg/dL 8.7  8.9  9.0   Total Protein 6.0 - 8.5 g/dL 6.8  6.4  7.1   Total Bilirubin 0.0 - 1.2 mg/dL 0.3  <5.7  <8.4   Alkaline Phos 44 - 121 IU/L 96  90  97   AST 0 - 40 IU/L 13  13  12    ALT 0 - 32 IU/L  12  8  6      LABS Hb: 13.0 g/dL Ferritin: 51 ng/mL O96: 394 pg/mL  RADIOGRAPHIC STUDIES: I have personally reviewed the radiological images as listed and agreed with the findings in the report.  No results found.    No orders of the defined types were placed in this encounter.   Rachel Moulds MD

## 2023-11-05 NOTE — Telephone Encounter (Signed)
-----   Message from [place] sent at 11/05/2023 10:17 AM EST ----- Antoney Biven  Can u fax her labs to [facility] office in [place].  Thanks,

## 2023-11-05 NOTE — Telephone Encounter (Signed)
Lab req was faxed to [facility] in [facility] at [phone]. Receipt of fax confirmed.

## 2023-11-12 ENCOUNTER — Encounter: Payer: Self-pay | Admitting: Hematology and Oncology

## 2023-11-18 ENCOUNTER — Encounter: Payer: Self-pay | Admitting: Hematology and Oncology

## 2023-11-22 DIAGNOSIS — E559 Vitamin D deficiency, unspecified: Secondary | ICD-10-CM | POA: Diagnosis not present

## 2023-11-22 DIAGNOSIS — E538 Deficiency of other specified B group vitamins: Secondary | ICD-10-CM | POA: Diagnosis not present

## 2023-11-22 DIAGNOSIS — R632 Polyphagia: Secondary | ICD-10-CM | POA: Diagnosis not present

## 2023-11-22 DIAGNOSIS — E663 Overweight: Secondary | ICD-10-CM | POA: Diagnosis not present

## 2023-11-25 ENCOUNTER — Encounter: Payer: Self-pay | Admitting: Gastroenterology

## 2023-11-25 MED ORDER — OMEPRAZOLE 40 MG PO CPDR: 40.0000 mg | DELAYED_RELEASE_CAPSULE | Freq: Every day | ORAL | 0 refills | Status: DC

## 2023-12-24 DIAGNOSIS — Z6829 Body mass index (BMI) 29.0-29.9, adult: Secondary | ICD-10-CM | POA: Diagnosis not present

## 2023-12-24 DIAGNOSIS — E663 Overweight: Secondary | ICD-10-CM | POA: Diagnosis not present

## 2023-12-24 DIAGNOSIS — R7303 Prediabetes: Secondary | ICD-10-CM | POA: Diagnosis not present

## 2023-12-24 DIAGNOSIS — R632 Polyphagia: Secondary | ICD-10-CM | POA: Diagnosis not present

## 2024-01-22 DIAGNOSIS — Z8639 Personal history of other endocrine, nutritional and metabolic disease: Secondary | ICD-10-CM | POA: Diagnosis not present

## 2024-01-22 DIAGNOSIS — E538 Deficiency of other specified B group vitamins: Secondary | ICD-10-CM | POA: Diagnosis not present

## 2024-01-22 DIAGNOSIS — R632 Polyphagia: Secondary | ICD-10-CM | POA: Diagnosis not present

## 2024-01-22 DIAGNOSIS — E663 Overweight: Secondary | ICD-10-CM | POA: Diagnosis not present

## 2024-02-18 DIAGNOSIS — R632 Polyphagia: Secondary | ICD-10-CM | POA: Diagnosis not present

## 2024-02-18 DIAGNOSIS — E538 Deficiency of other specified B group vitamins: Secondary | ICD-10-CM | POA: Diagnosis not present

## 2024-02-18 DIAGNOSIS — E559 Vitamin D deficiency, unspecified: Secondary | ICD-10-CM | POA: Diagnosis not present

## 2024-02-18 DIAGNOSIS — Z8639 Personal history of other endocrine, nutritional and metabolic disease: Secondary | ICD-10-CM | POA: Diagnosis not present

## 2024-02-22 ENCOUNTER — Other Ambulatory Visit: Payer: Self-pay | Admitting: Gastroenterology

## 2024-02-24 MED ORDER — OMEPRAZOLE 40 MG PO CPDR: 40.0000 mg | DELAYED_RELEASE_CAPSULE | Freq: Every day | ORAL | 0 refills | Status: DC

## 2024-03-06 ENCOUNTER — Other Ambulatory Visit (HOSPITAL_COMMUNITY): Payer: Self-pay

## 2024-03-20 DIAGNOSIS — E538 Deficiency of other specified B group vitamins: Secondary | ICD-10-CM | POA: Diagnosis not present

## 2024-03-20 DIAGNOSIS — E559 Vitamin D deficiency, unspecified: Secondary | ICD-10-CM | POA: Diagnosis not present

## 2024-03-20 DIAGNOSIS — Z79899 Other long term (current) drug therapy: Secondary | ICD-10-CM | POA: Diagnosis not present

## 2024-03-20 DIAGNOSIS — E782 Mixed hyperlipidemia: Secondary | ICD-10-CM | POA: Diagnosis not present

## 2024-03-20 DIAGNOSIS — D508 Other iron deficiency anemias: Secondary | ICD-10-CM | POA: Diagnosis not present

## 2024-03-20 DIAGNOSIS — R7303 Prediabetes: Secondary | ICD-10-CM | POA: Diagnosis not present

## 2024-03-22 ENCOUNTER — Other Ambulatory Visit: Payer: Self-pay | Admitting: Gastroenterology

## 2024-03-23 ENCOUNTER — Other Ambulatory Visit: Payer: Self-pay

## 2024-03-23 ENCOUNTER — Encounter: Payer: Self-pay | Admitting: Gastroenterology

## 2024-03-23 MED ORDER — OMEPRAZOLE 40 MG PO CPDR: 40.0000 mg | DELAYED_RELEASE_CAPSULE | Freq: Every day | ORAL | 0 refills | Status: DC

## 2024-04-03 ENCOUNTER — Telehealth: Payer: Self-pay | Admitting: Hematology and Oncology

## 2024-04-03 NOTE — Telephone Encounter (Signed)
 Left vm about rescheduled appt date and time.

## 2024-04-15 ENCOUNTER — Encounter: Payer: Self-pay | Admitting: Adult Health

## 2024-04-24 DIAGNOSIS — R632 Polyphagia: Secondary | ICD-10-CM | POA: Diagnosis not present

## 2024-04-24 DIAGNOSIS — Z9189 Other specified personal risk factors, not elsewhere classified: Secondary | ICD-10-CM | POA: Diagnosis not present

## 2024-04-24 DIAGNOSIS — E538 Deficiency of other specified B group vitamins: Secondary | ICD-10-CM | POA: Diagnosis not present

## 2024-04-24 DIAGNOSIS — D508 Other iron deficiency anemias: Secondary | ICD-10-CM | POA: Diagnosis not present

## 2024-04-24 DIAGNOSIS — E782 Mixed hyperlipidemia: Secondary | ICD-10-CM | POA: Diagnosis not present

## 2024-04-27 ENCOUNTER — Ambulatory Visit: Admitting: Physician Assistant

## 2024-05-04 ENCOUNTER — Ambulatory Visit: Payer: BC Managed Care – PPO | Admitting: Hematology and Oncology

## 2024-05-06 ENCOUNTER — Ambulatory Visit: Payer: Self-pay | Admitting: Adult Health

## 2024-06-08 ENCOUNTER — Encounter: Payer: Self-pay | Admitting: Hematology and Oncology

## 2024-06-08 DIAGNOSIS — I10 Essential (primary) hypertension: Secondary | ICD-10-CM | POA: Diagnosis not present

## 2024-06-08 DIAGNOSIS — R7303 Prediabetes: Secondary | ICD-10-CM | POA: Diagnosis not present

## 2024-06-08 DIAGNOSIS — D508 Other iron deficiency anemias: Secondary | ICD-10-CM | POA: Diagnosis not present

## 2024-06-08 DIAGNOSIS — R632 Polyphagia: Secondary | ICD-10-CM | POA: Diagnosis not present

## 2024-06-15 ENCOUNTER — Ambulatory Visit: Admitting: Physician Assistant

## 2024-06-15 ENCOUNTER — Ambulatory Visit: Payer: Self-pay | Admitting: Adult Health

## 2024-09-04 NOTE — Progress Notes (Signed)
 09/07/2024 MAY MANRIQUE [id number] 05-19-1971  Referring provider: Teresa Channel, MD Primary GI doctor: Dr. San  ASSESSMENT AND PLAN:   Hemorrhoids with abnormal rectal exam with possible posterior abnormality Seen on colonoscopy in 2019, now with prolapsing hemorrhoid with straining, small volume BRB Occ constipation from zepbound  Anoscopy with large right posterior hemorrhoid but also palpated possible external mass posterior, non tender No urinary symptoms, no history of back/tailbone surgery Schedule colonoscopy, consider CT ABP if any external compression -Sitz baths, increase fiber, increase water -Hydrocortisone  supp given and external cream sent in.  -We discussed hemorrhoid banding here in the office if colonoscopy is unremarkable.  History of colon polyps Colonoscopy 08/2018.  Internal hemorrhoids, diverticulosis, sigmoid polyp (benign path), rectal polyp x2 (tubular adenomas).  5-year repeat per patient - Hemorrhoid banding recommended in 2019 by Dr. Luis, but postponed d/t Covid concerns Schedule for colonoscopy at [facility], We have discussed the risks of bleeding, infection, perforation, medication reactions, and remote risk of death associated with colonoscopy. All questions were answered and the patient acknowledges these risk and wishes to proceed.  GERD longstanding history EGD 09/21/2021 EGD:5 cm HH, normal Z-line, mild Schatzki's ring dilated with 20 mm Savary then fractured with forceps. Benign fundic gland polyps, normal duodenum  12/05/2021 pH/impedance off PPI normal GL acid exposure slightly abnormal in supine positive symptom correlation 12/13/2021 manometry ineffective esophageal motility normal relaxation Recommended against antireflux surgery given ineffective esophageal motility On omeprazole  40 mg daily and symptoms well controlled, refilled medication Given information about GLP1 Given information about alginate therapy  Obesity on  GLP1 Body mass index is 27.56 kg/m.  -Patient has been advised to make an attempt to improve diet and exercise patterns to aid in weight loss. -Recommended diet heavy in fruits and veggies and low in animal meats, cheeses, and dairy products, appropriate calorie intake Discussed GLP1 with the patient, mechanism of action. Gastroparesis diet given to the patient.  Patient should be instructed to hold this medications if dose falls within 7 days of endoscopic procedure, due to increased risk of retained gastric contents.  Fatty liver seen on US  2001 No LFT elevation    Latest Ref Rng & Units 10/02/2021    8:32 AM 01/30/2021    8:56 AM 08/31/2020    4:26 PM  Hepatic Function  Total Protein 6.0 - 8.5 g/dL 6.8  6.4  7.1   Albumin 3.8 - 4.9 g/dL 4.1  4.0  4.0   AST 0 - 40 IU/L 13  13  12    ALT 0 - 32 IU/L 12  8  6    Alk Phosphatase 44 - 121 IU/L 96  90  97   Total Bilirubin 0.0 - 1.2 mg/dL 0.3  <9.7  <9.7    Platelets 280  - need LFTs and CBC monitored every 6 months - get hepatocellular work up if LFT elevation -consider repeat imaging -Continue to work on risk factor modification including diet exercise and control of risk factors including blood sugars. - Counseled on ETOH cessation.  - continue GLP1    Patient Care Team: Teresa Channel, MD as PCP - General (Family Medicine)  HISTORY OF PRESENT ILLNESS: 53 y.o. female with a past medical history listed below presents for evaluation of colon cancer screening and GERD.   Last seen in the office 05/10/2023 by Dr. San for reflux and dysphagia, with worsening symptoms after starting new GLP-1.  Discussed the use of AI scribe software for clinical note transcription with the  patient, who gave verbal consent to proceed.  History of Present Illness   ILAYDA TODA is a 53 year old female who presents for evaluation of hemorrhoids and colonoscopy scheduling.  She has a history of polyps, with the last colonoscopy revealing  one benign polyp in the sigmoid colon and two tubular adenomatous polyps in the rectum. She had to cancel a previously scheduled colonoscopy due to high costs and is seeking options for a more affordable procedure.  She experiences hemorrhoid symptoms, noting that what was previously internal is now prolapsing externally, requiring manual reduction. There is small volume, bright red bleeding, particularly with straining. These symptoms are associated with episodes of constipation, which she attributes to her medication regimen, including Zepbound  and Zofran . In the review of symptoms, she reports occasional reflux symptoms, particularly when lying flat, with noted improvement with weight loss. No current use of NSAIDs.  Her medication regimen includes Zepbound , which causes nausea and constipation, and Zofran , which exacerbates constipation. She is also on omeprazole  40 mg for reflux, which she finds effective but notes that missing doses leads to symptom recurrence. She has experienced significant weight loss, approximately 86 pounds.  She has a history of back surgery at L4-L5 with no hardware used, past ovarian cyst removal, and a D&C following a miscarriage. She started hormone replacement therapy in October.    She  reports that she has never smoked. She has never used smokeless tobacco. She reports that she does not drink alcohol and does not use drugs.  RELEVANT GI HISTORY, IMAGING AND LABS: Results   DIAGNOSTIC Colonoscopy: Internal hemorrhoids noted. One polyp in sigmoid colon removed, benign. Two tubular adenomatous polyps in rectum. (2019)     Previously followed with Dr. Luis and prior to that with Dr. Celestia at [facility]: - Reflux started in 2008 during pregnancy.  Index symptoms of heartburn, regurgitation - 2010: Developed intermittent solid food dysphagia.  Was seen by Dr. Celestia at [facility].  Started omeprazole  20 mg/day with overall improvement - Esophagram 09/2009: Small HH,  mild ring at GE junction - 2015: Omeprazole  changed to Zantac due to PPI concerns, with immediate breakthrough and eventually changed back to PPI. - EGD (12/2015, Dr. Luis): 6 cm hiatal hernia, esophageal stricture dilated with 17 mm Savary.  Inflammatory changes on esophageal stricture biopsies.  LA Grade D esophagitis.  Started omeprazole  40 mg/day - EGD 02/2016 (Dr. Luis): Resolved erosive esophagitis, large 5 cm hiatal hernia, nonobstructing distal esophageal stricture.  Continued omeprazole  40 mg/day with relief of reflux sxs then tapered to 20 mg/day in 2018, but breakthrough at 20 mg/day -Colonoscopy 08/2018.  Internal hemorrhoids, diverticulosis, sigmoid polyp (benign path), rectal polyp x2 (tubular adenomas).  5-year repeat per patient - Hemorrhoid banding recommended in 2019 by Dr. Luis, but postponed d/t Covid concerns   -05/23/2021: Evaluated in the GI clinic for intermittent solid food dysphagia and increasing reflux symptoms.  Index reflux symptoms of increased throat clearing, and nausea, chronic dry cough.  Takes omeprazole  40 mg/day and Tums prn breakthrough.  As breakthrough with any missed dose of omeprazole .  Sleeps with HOB elevated. - 05/2021: Normal/negative celiac panel - 09/21/2021: EGD:5 cm HH, normal Z-line, mild Schatzki's ring dilated with 20 mm Savary then fractured with forceps.  Benign fundic gland polyps, normal duodenum -10/20/2021: Evaluation by Dr. Tanda.   - 12/13/2021: Esophageal Manometry: Ineffective esophageal motility.  80% failed swallows with absent peristalsis.  Only 20% of swallows normal/intact.  Normal relaxation and inadequate augmentation of  contractile phase following multiple rapid swallows with 20% bolus clearance.  Hiatal hernia noted. - 12/05/2021: pH/impedance (off PPI): Normal esophageal acid exposure with percent time pH <4 of 3.3%.  DeMeester slightly elevated at 15.8.  Esophageal acid exposure abnormal in supine with elevated number of reflux  events (60) and positive symptom correlation for heartburn based on SAP. - Recommended against antireflux surgery given ineffective esophageal motility     Maternal uncle with Esophageal CA. Father with colon polyps. No known family history of CRC, liver disease, pancreatic disease, or IBD CBC    Component Value Date/Time   WBC 7.7 06/05/2022 0835   RBC 4.76 06/05/2022 0835   HGB 13.1 06/05/2022 0835   HGB 13.1 03/22/2022 0932   HGB 14.2 10/02/2021 0832   HCT 38.9 06/05/2022 0835   HCT 43.8 10/02/2021 0832   PLT 280 06/05/2022 0835   PLT 290 03/22/2022 0932   PLT 272 10/02/2021 0832   MCV 81.7 06/05/2022 0835   MCV 84 10/02/2021 0832   MCH 27.5 06/05/2022 0835   MCHC 33.7 06/05/2022 0835   RDW 12.9 06/05/2022 0835   RDW 13.3 10/02/2021 0832   LYMPHSABS 1.6 06/05/2022 0835   LYMPHSABS 1.8 10/02/2021 0832   MONOABS 0.4 06/05/2022 0835   EOSABS 0.1 06/05/2022 0835   EOSABS 0.1 10/02/2021 0832   BASOSABS 0.0 06/05/2022 0835   BASOSABS 0.0 10/02/2021 0832   No results for input(s): HGB in the last 8760 hours.  CMP     Component Value Date/Time   NA 140 10/02/2021 0832   K 3.9 10/02/2021 0832   CL 98 10/02/2021 0832   CO2 24 10/02/2021 0832   GLUCOSE 85 10/02/2021 0832   BUN 7 10/02/2021 0832   CREATININE 0.92 10/02/2021 0832   CALCIUM 8.7 10/02/2021 0832   PROT 6.8 10/02/2021 0832   ALBUMIN 4.1 10/02/2021 0832   AST 13 10/02/2021 0832   ALT 12 10/02/2021 0832   ALKPHOS 96 10/02/2021 0832   BILITOT 0.3 10/02/2021 0832   GFRNONAA 80 01/30/2021 0856   GFRAA 92 01/30/2021 0856      Latest Ref Rng & Units 10/02/2021    8:32 AM 01/30/2021    8:56 AM 08/31/2020    4:26 PM  Hepatic Function  Total Protein 6.0 - 8.5 g/dL 6.8  6.4  7.1   Albumin 3.8 - 4.9 g/dL 4.1  4.0  4.0   AST 0 - 40 IU/L 13  13  12    ALT 0 - 32 IU/L 12  8  6    Alk Phosphatase 44 - 121 IU/L 96  90  97   Total Bilirubin 0.0 - 1.2 mg/dL 0.3  <9.7  <9.7       Current Medications:   Current  Outpatient Medications (Endocrine & Metabolic):    estradiol (ESTRACE) 2 MG tablet, Take 2 mg by mouth daily.   progesterone (PROMETRIUM) 100 MG capsule, Take 100 mg by mouth daily.     Current Outpatient Medications (Hematological):    cyanocobalamin  (VITAMIN B12) 1000 MCG/ML injection, Inject 1 mL (1,000 mcg total) into the muscle every 30 (thirty) days.  Current Outpatient Medications (Other):    buPROPion  (WELLBUTRIN  XL) 150 MG 24 hr tablet, Take 1 tablet (150 mg total) by mouth daily.   Cholecalciferol (VITAMIN D3) 50 MCG (2000 UT) capsule, Take by mouth.   hydrocortisone  (ANUSOL -HC) 2.5 % rectal cream, Place 1 Application rectally 2 (two) times daily.   hydrocortisone  (ANUSOL -HC) 25 MG suppository, Place 1 suppository (25  mg total) rectally 2 (two) times daily.   mupirocin  ointment (BACTROBAN ) 2 %, Apply 1 application. topically 2 (two) times daily.   Na Sulfate-K Sulfate-Mg Sulfate concentrate (SUPREP) 17.5-3.13-1.6 GM/177ML SOLN, Take 1 kit (354 mLs total) by mouth once for 1 dose.   nystatin  ointment (MYCOSTATIN ), Apply 1 application. topically 2 (two) times daily as needed.   ondansetron  (ZOFRAN ) 4 MG tablet, Take 4 mg by mouth every 6 (six) hours as needed.   phentermine  (ADIPEX-P ) 37.5 MG tablet, Take 18.75-37.5 mg by mouth daily.   SYRINGE/NEEDLE, DISP, 1 ML (B-D 1CC SLIP TIP SYR 25GX5/8) 25G X 5/8 1 ML MISC, Use to administer b12 injections   tirzepatide  (ZEPBOUND ) 10 MG/0.5ML Pen, Inject 10 mg into the skin once a week.   traZODone  (DESYREL ) 50 MG tablet, Take 0.5-1 tablets (25-50 mg total) by mouth at bedtime as needed for sleep.   Vitamin D , Ergocalciferol , (DRISDOL ) 1.25 MG (50000 UNIT) CAPS capsule, Take 1 capsule (50,000 Units total) by mouth every 7 (seven) days.   famotidine  (PEPCID ) 20 MG tablet, Take 1 tablet (20 mg total) by mouth at bedtime. (Patient not taking: Reported on 09/07/2024)   omeprazole  (PRILOSEC) 40 MG capsule, Take 1 capsule (40 mg total) by mouth  daily.   tirzepatide  (ZEPBOUND ) 10 MG/0.5ML Pen, Inject 10mg  (0.70mL) under the skin once weekly.  Medical History:  Past Medical History:  Diagnosis Date   Anemia    Back pain    Dyspnea    Elevated blood pressure reading without diagnosis of hypertension    Esophageal ulcer    GERD (gastroesophageal reflux disease)    Hot flashes    Hypothyroid    Joint pain    Sciatica, left side    Vitamin B 12 deficiency    Vitamin D  deficiency    Allergies: No Known Allergies   Surgical History:  She  has a past surgical history that includes Ovarian cyst surgery; Dilation and curettage of uterus; Ankle fracture surgery (Left, 07/18/2021); 24 hour ph study (N/A, 12/13/2021); PH impedance study (N/A, 12/13/2021); Esophageal manometry (N/A, 12/13/2021); and L4/L5 microdisc (Left, 2019). Family History:  Her family history includes Alcoholism in her mother; Colon polyps in her father; Diabetes in her father; Eating disorder in her mother; Esophageal cancer in her maternal uncle; Hypertension in her father; Sleep apnea in her father.  REVIEW OF SYSTEMS  : All other systems reviewed and negative except where noted in the History of Present Illness.  PHYSICAL EXAM: BP 130/76 (BP Location: Right Arm, Patient Position: Sitting, Cuff Size: Normal)   Pulse 71   Ht 5' 6.5 (1.689 m)   Wt 173 lb 6 oz (78.6 kg)   BMI 27.56 kg/m  Physical Exam   GENERAL APPEARANCE: Well nourished, in no apparent distress. HEENT: Head, ears, eyes, nose, sinuses, oral cavity, and throat are normal. No cervical lymphadenopathy, unremarkable thyroid , sclerae anicteric, conjunctiva pink. RESPIRATORY: Respiratory effort normal, breath sounds equal bilaterally without rales, rhonchi, or wheezing. CARDIO: RRR with no murmurs, rubs, or gallops, peripheral pulses intact. ABDOMEN: Soft, non-distended, active bowel sounds in all four quadrants, no tenderness to palpation, no rebound, no mass appreciated. Rectal:  External exam:  Normal external rectal exam Rectal tone: normal rectal tone non-tender, posterior wall 2 cm nontender soft external compression or mass brown stool, hemoccult Negative Anoscopy was performed after rectal exam with the patient in the left lateral decubitus position and revealed Right anterior hemorrhoids Right posterior hemorrhoids with inflammation No nodule seen MUSCULOSKELETAL: Full range  of motion, normal gait, without edema. SKIN: Dry, intact without rashes or lesions. No jaundice. NEURO: Alert, oriented, no focal deficits. PSYCH: Cooperative, normal mood and affect.      Alan JONELLE Coombs, PA-C 10:14 AM

## 2024-09-07 ENCOUNTER — Encounter: Payer: Self-pay | Admitting: Physician Assistant

## 2024-09-07 ENCOUNTER — Ambulatory Visit: Admitting: Physician Assistant

## 2024-09-07 VITALS — BP 130/76 | HR 71 | Ht 66.5 in | Wt 173.4 lb

## 2024-09-07 DIAGNOSIS — Z860101 Personal history of adenomatous and serrated colon polyps: Secondary | ICD-10-CM

## 2024-09-07 DIAGNOSIS — K649 Unspecified hemorrhoids: Secondary | ICD-10-CM | POA: Diagnosis not present

## 2024-09-07 DIAGNOSIS — R110 Nausea: Secondary | ICD-10-CM

## 2024-09-07 DIAGNOSIS — E669 Obesity, unspecified: Secondary | ICD-10-CM

## 2024-09-07 DIAGNOSIS — R6889 Other general symptoms and signs: Secondary | ICD-10-CM

## 2024-09-07 DIAGNOSIS — Z6827 Body mass index (BMI) 27.0-27.9, adult: Secondary | ICD-10-CM | POA: Diagnosis not present

## 2024-09-07 DIAGNOSIS — Z7985 Long-term (current) use of injectable non-insulin antidiabetic drugs: Secondary | ICD-10-CM

## 2024-09-07 DIAGNOSIS — K219 Gastro-esophageal reflux disease without esophagitis: Secondary | ICD-10-CM | POA: Diagnosis not present

## 2024-09-07 MED ORDER — NA SULFATE-K SULFATE-MG SULF 17.5-3.13-1.6 GM/177ML PO SOLN
1.0000 | Freq: Once | ORAL | 0 refills | Status: AC
Start: 2024-09-07 — End: 2024-09-07

## 2024-09-07 MED ORDER — OMEPRAZOLE 40 MG PO CPDR: 40.0000 mg | DELAYED_RELEASE_CAPSULE | Freq: Every day | ORAL | 3 refills | Status: DC

## 2024-09-07 MED ORDER — HYDROCORTISONE ACETATE 25 MG RE SUPP: 25.0000 mg | Freq: Two times a day (BID) | RECTAL | 0 refills | Status: AC

## 2024-09-07 MED ORDER — HYDROCORTISONE (PERIANAL) 2.5 % EX CREA: 1.0000 | TOPICAL_CREAM | Freq: Two times a day (BID) | CUTANEOUS | 2 refills | Status: AC

## 2024-09-07 NOTE — Patient Instructions (Addendum)
 _______________________________________________________  If your blood pressure at your visit was 140/90 or greater, please contact your primary care physician to follow up on this.  _______________________________________________________  If you are age 54 or older, your body mass index should be between 23-30. Your Body mass index is 27.56 kg/m. If this is out of the aforementioned range listed, please consider follow up with your Primary Care Provider.  If you are age 70 or younger, your body mass index should be between 19-25. Your Body mass index is 27.56 kg/m. If this is out of the aformentioned range listed, please consider follow up with your Primary Care Provider.   ________________________________________________________  The [facility] providers would like to encourage you to use MYCHART to communicate with providers for non-urgent requests or questions.  Due to long hold times on the telephone, sending your provider a message by [facility] may be a faster and more efficient way to get a response.  Please allow 48 business hours for a response.  Please remember that this is for non-urgent requests.  _______________________________________________________  [facility] is using a team-based approach to care.  Your team is made up of your doctor and two to three APPS. Our APPS (Nurse Practitioners and Physician Assistants) work with your physician to ensure care continuity for you. They are fully qualified to address your health concerns and develop a treatment plan. They communicate directly with your gastroenterologist to care for you. Seeing the Advanced Practice Practitioners on your physician's team can help you by facilitating care more promptly, often allowing for earlier appointments, access to diagnostic testing, procedures, and other specialty referrals.    We will not schedule the ultrasound at this time. Please wait until after the procedure to see if one is  needed  If the hemorrhoid suppository sent in is too expensive you can do this over the counter trick.  Apply a pea size amount of generic prescription Anusol  HC cream that has been sent into your pharmacy to the tip of an over the counter PrepH suppository and insert rectally once every night for at least 7 nights.   We have sent the following medications to your pharmacy for you to pick up at your convenience: Suprep  Toileting tips to help with your constipation - Drink at least 64-80 ounces of water/liquid per day. - Establish a time to try to move your bowels every day.  For many people, this is after a cup of coffee or after a meal such as breakfast. - Sit all of the way back on the toilet keeping your back fairly straight and while sitting up, try to rest the tops of your forearms on your upper thighs.   - Raising your feet with a step stool/squatty potty can be helpful to improve the angle that allows your stool to pass through the rectum. - Relax the rectum feeling it bulge toward the toilet water.  If you feel your rectum raising toward your body, you are contracting rather than relaxing. - Breathe in and slowly exhale. Belly breath by expanding your belly towards your belly button. Keep belly expanded as you gently direct pressure down and back to the anus.  A low pitched GRRR sound can assist with increasing intra-abdominal pressure.  (Can also trying to blow on a pinwheel and make it move, this helps with the same belly breathing) - Repeat 3-4 times. If unsuccessful, contract the pelvic floor to restore normal tone and get off the toilet.  Avoid excessive straining. - To  reduce excessive wiping by teaching your anus to normally contract, place hands on outer aspect of knees and resist knee movement outward.  Hold 5-10 second then place hands just inside of knees and resist inward movement of knees.  Hold 5 seconds.  Repeat a few times each way.  Go to the ER if unable to pass gas,  severe AB pain, unable to hold down food, any shortness of breath of chest pain.  Reflux Gourmet Rescue  It is an ALGINATE THERAPY which is the only intervention that works to safeguard the esophagus by creating a protective barrier that actually stops reflux from happening. -The general directions for use are as stated on the packaging: Take 1 teaspoon (5 ml), or more as needed or as directed by your physician, after meals and before bed. -These general directions address the most common times for reflux to occur, but our Rescue products may be taken anytime. Some individuals may take our product preemptively, when they know they will suffer from reflux, or as needed - when discomfort arises. (If taken around food, it should be consumed last.) -You do not have to take 1 teaspoon (5 ml) of the product. While one teaspoon (5ml) may be the perfect average amount to relieve reflux suffering in some, others may require more or less. You may adjust the amount of Mint Chocolate Rescue and Vanilla Caramel Rescue to the lowest amount necessary to meet your individual needs to improve your quality of life. -You may dilute the product if it is too viscous for you to consume. Keep in mind, however, that the thickness of the product was formulated to provide optimal coating and protection of your throat and esophagus. Though diluting the product is possible, it may reduce the protective function and/or length of action. -This can be used in conjunction with reflux medications and lifestyle changes.  100% ALL-NATURAL  Paraben FREE, glycerin FREE, & potassium FREE  Made entirely from all-natural ingredients considered safe for children and during pregnancy  No known side effects  All-natural flavor Gluten FREE  Allergen FREE  Vegan  Can find more information here: NameSeizer.co.nz   Miralax is an osmotic laxative.  It only brings more water into the stool.  This is safe  to take daily.  Can take up to 17 gram of miralax twice a day.  Mix with juice or coffee.  Start 1 capful at night for 3-4 days and reassess your response in 3-4 days.  You can increase and decrease the dose based on your response.  Remember, it can take up to 3-4 days to take effect OR for the effects to wear off.   I often pair this with benefiber in the morning to help assure the stool is not too loose.   You have been scheduled for a colonoscopy. Please follow written instructions given to you at your visit today.   If you use inhalers (even only as needed), please bring them with you on the day of your procedure.  DO NOT TAKE 7 DAYS PRIOR TO TEST- Trulicity (dulaglutide) Ozempic, Wegovy  (semaglutide ) Mounjaro  (tirzepatide ) Bydureon Bcise (exanatide extended release)  DO NOT TAKE 1 DAY PRIOR TO YOUR TEST Rybelsus  (semaglutide ) Adlyxin (lixisenatide) Victoza (liraglutide) Byetta (exanatide) ___________________________________________________________________________    Gastroparesis Gastroparesis is a condition in which food takes longer than normal to empty from the stomach.  This condition is also known as delayed gastric emptying. It is usually a long-term (chronic) condition.  What are the signs or symptoms? Symptoms  of this condition include: Feeling full after eating very little or a loss of appetite. Nausea, vomiting, or heartburn. Bloating of your abdomen. Inconsistent blood sugar (glucose) levels on blood tests. Unexplained weight loss. Acid from the stomach coming up into the esophagus (gastroesophageal reflux). Sudden tightening (spasm) of the stomach, which can be painful. Symptoms may come and go. Some people may not notice any symptoms.  What increases the risk? You are more likely to develop this condition if: You have certain disorders or diseases. These may include: An endocrine disorder. An eating disorder. Amyloidosis. Scleroderma. Parkinson's  disease. Multiple sclerosis. Cancer or infection of the stomach or the vagus nerve. You have had surgery on your stomach or vagus nerve. You take certain medicines. You are female.  Things you can do: Please do small frequent meals like 4-6 meals a day.  Eat and drink liquids at separate times.  Avoid high fiber foods, cook your vegetables, avoid high fat food.  Suggest spreading protein throughout the day (greek yogurt, glucerna, soft meat, milk, eggs) Choose soft foods that you can mash with a fork When you are more symptomatic, change to pureed foods foods and liquids.  Consider reading Living well with Gastroparesis by [facility] Check out this link to a diet online [email or web address]://my.GroupJournal.fr

## 2024-09-08 NOTE — Addendum Note (Signed)
 Addended by: Cindie Rajagopalan E on: 09/08/2024 02:15 PM   Modules accepted: Orders

## 2024-09-16 DIAGNOSIS — D508 Other iron deficiency anemias: Secondary | ICD-10-CM | POA: Diagnosis not present

## 2024-09-16 DIAGNOSIS — R7303 Prediabetes: Secondary | ICD-10-CM | POA: Diagnosis not present

## 2024-09-16 DIAGNOSIS — E538 Deficiency of other specified B group vitamins: Secondary | ICD-10-CM | POA: Diagnosis not present

## 2024-09-16 DIAGNOSIS — L659 Nonscarring hair loss, unspecified: Secondary | ICD-10-CM | POA: Diagnosis not present

## 2024-09-16 DIAGNOSIS — I10 Essential (primary) hypertension: Secondary | ICD-10-CM | POA: Diagnosis not present

## 2024-09-16 DIAGNOSIS — R632 Polyphagia: Secondary | ICD-10-CM | POA: Diagnosis not present

## 2024-09-16 DIAGNOSIS — E559 Vitamin D deficiency, unspecified: Secondary | ICD-10-CM | POA: Diagnosis not present

## 2024-09-17 DIAGNOSIS — H40013 Open angle with borderline findings, low risk, bilateral: Secondary | ICD-10-CM | POA: Diagnosis not present

## 2024-09-17 DIAGNOSIS — H1045 Other chronic allergic conjunctivitis: Secondary | ICD-10-CM | POA: Diagnosis not present

## 2024-09-17 DIAGNOSIS — H53143 Visual discomfort, bilateral: Secondary | ICD-10-CM | POA: Diagnosis not present

## 2024-09-28 ENCOUNTER — Encounter: Payer: Self-pay | Admitting: Gastroenterology

## 2024-09-30 ENCOUNTER — Encounter: Payer: Self-pay | Admitting: Hematology and Oncology

## 2024-10-02 ENCOUNTER — Other Ambulatory Visit: Payer: Self-pay | Admitting: Hematology and Oncology

## 2024-10-02 NOTE — Progress Notes (Signed)
 I think its reasonable to proceed with venofer   This has been ordered venofer  200 mg times 5 at [facility] infusion. Left a voicemail to call us  back if she doesn't hear back from scheduling.  Thanks,

## 2024-10-05 ENCOUNTER — Encounter: Payer: Self-pay | Admitting: Gastroenterology

## 2024-10-05 ENCOUNTER — Other Ambulatory Visit: Payer: Self-pay | Admitting: *Deleted

## 2024-10-05 ENCOUNTER — Telehealth: Payer: Self-pay | Admitting: Hematology and Oncology

## 2024-10-05 ENCOUNTER — Ambulatory Visit (AMBULATORY_SURGERY_CENTER): Admitting: Gastroenterology

## 2024-10-05 ENCOUNTER — Telehealth: Payer: Self-pay | Admitting: *Deleted

## 2024-10-05 ENCOUNTER — Telehealth: Payer: Self-pay

## 2024-10-05 VITALS — BP 96/75 | HR 66 | Temp 98.4°F | Resp 12 | Ht 66.0 in | Wt 173.0 lb

## 2024-10-05 DIAGNOSIS — Z1211 Encounter for screening for malignant neoplasm of colon: Secondary | ICD-10-CM | POA: Diagnosis not present

## 2024-10-05 DIAGNOSIS — Z860101 Personal history of adenomatous and serrated colon polyps: Secondary | ICD-10-CM

## 2024-10-05 DIAGNOSIS — D123 Benign neoplasm of transverse colon: Secondary | ICD-10-CM | POA: Diagnosis not present

## 2024-10-05 DIAGNOSIS — K649 Unspecified hemorrhoids: Secondary | ICD-10-CM | POA: Diagnosis not present

## 2024-10-05 DIAGNOSIS — K921 Melena: Secondary | ICD-10-CM

## 2024-10-05 DIAGNOSIS — R6889 Other general symptoms and signs: Secondary | ICD-10-CM

## 2024-10-05 DIAGNOSIS — K5730 Diverticulosis of large intestine without perforation or abscess without bleeding: Secondary | ICD-10-CM | POA: Diagnosis not present

## 2024-10-05 DIAGNOSIS — K641 Second degree hemorrhoids: Secondary | ICD-10-CM

## 2024-10-05 MED ORDER — SODIUM CHLORIDE 0.9 % IV SOLN: 500.0000 mL | Freq: Once | INTRAVENOUS | Status: DC

## 2024-10-05 MED ORDER — OMEPRAZOLE 20 MG PO CPDR: 20.0000 mg | DELAYED_RELEASE_CAPSULE | Freq: Every day | ORAL | 3 refills | Status: AC

## 2024-10-05 NOTE — Telephone Encounter (Signed)
 Patient referred to infusion pharmacy team for ambulatory infusion of IV iron .  Insurance - [employer] of care - Site of care: [facility] Dx code - E61.1 IV Iron  Therapy - Venofer  200 mg x 5  Infusion appointments - Scheduling team will schedule patient as soon as possible.   Thank you,  Norton Blush, PharmD, BCSCP Pharmacist [facility]

## 2024-10-05 NOTE — Progress Notes (Signed)
 Updated medical hx with pt

## 2024-10-05 NOTE — Telephone Encounter (Signed)
 Dr. Loretha, patient will be scheduled as soon as possible.  Auth Submission: NO AUTH NEEDED Site of care: Site of care: [facility] Payer: [employer] commercial Medication & CPT/J Code(s) submitted: Venofer  (Iron  Sucrose) J1756 Diagnosis Code:  Route of submission (phone, fax, portal):  Phone # Fax # Auth type: Buy/Bill PB Units/visits requested: 200mg  x 5 doses Reference number:  Approval from: 10/05/24 to 12/16/24

## 2024-10-05 NOTE — Op Note (Signed)
  [facility] Patient Name: Katelyn Walker Procedure Date: 10/05/2024 12:59 PM MRN: [id number] Endoscopist: Sandor Flatter , MD, [id number] Age: 53 Referring MD:  Date of Birth: 05/12/1971 Gender: Female Account #: [id number] Procedure:                Colonoscopy Indications:              Hematochezia, History of colon polyps Medicines:                Monitored Anesthesia Care Procedure:                Pre-Anesthesia Assessment:                           - Prior to the procedure, a History and Physical                            was performed, and patient medications and                            allergies were reviewed. The patient's tolerance of                            previous anesthesia was also reviewed. The risks                            and benefits of the procedure and the sedation                            options and risks were discussed with the patient.                            All questions were answered, and informed consent                            was obtained. Prior Anticoagulants: The patient has                            taken no anticoagulant or antiplatelet agents. ASA                            Grade Assessment: II - A patient with mild systemic                            disease. After reviewing the risks and benefits,                            the patient was deemed in satisfactory condition to                            undergo the procedure.                           After obtaining informed consent, the colonoscope  was passed under direct vision. Throughout the                            procedure, the patient's blood pressure, pulse, and                            oxygen saturations were monitored continuously. The                            CF [id number] #[id number] was introduced through the anus                            and advanced to the the terminal ileum. The                            colonoscopy was  performed without difficulty. The                            patient tolerated the procedure well. The quality                            of the bowel preparation was good. The terminal                            ileum, ileocecal valve, appendiceal orifice, and                            rectum were photographed. Scope In: 1:32:00 PM Scope Out: 1:44:35 PM Scope Withdrawal Time: 0 hours 10 minutes 1 second  Total Procedure Duration: 0 hours 12 minutes 35 seconds  Findings:                 Hemorrhoids were found on perianal exam.                           A 3 mm polyp was found in the transverse colon. The                            polyp was sessile. The polyp was removed with a                            cold snare. Resection and retrieval were complete.                            Estimated blood loss was minimal.                           Multiple medium-mouthed and small-mouthed                            diverticula were found in the sigmoid colon,                            descending colon, transverse colon, ascending colon  and cecum.                           Non-bleeding internal hemorrhoids were found during                            retroflexion. The hemorrhoids were medium-sized and                            Grade II (internal hemorrhoids that prolapse but                            reduce spontaneously).                           The terminal ileum appeared normal. Complications:            No immediate complications. Estimated Blood Loss:     Estimated blood loss was minimal. Impression:               - Hemorrhoids found on perianal exam.                           - One 3 mm polyp in the transverse colon, removed                            with a cold snare. Resected and retrieved.                           - Diverticulosis in the sigmoid colon, in the                            descending colon, in the transverse colon, in the                             ascending colon and in the cecum.                           - Non-bleeding internal hemorrhoids.                           - The examined portion of the ileum was normal. Recommendation:           - Patient has a contact number available for                            emergencies. The signs and symptoms of potential                            delayed complications were discussed with the                            patient. Return to normal activities tomorrow.                            Written discharge instructions were provided to the  patient.                           - Resume previous diet.                           - Continue present medications.                           - Await pathology results.                           - Repeat colonoscopy for surveillance based on                            pathology results.                           - Return to GI office PRN.                           - Internal hemorrhoids were noted on this study and                            may be amenable to hemorrhoid band ligation. If you                            are interested in further treatment of these                            hemorrhoids with band ligation, please contact my                            clinic to set up an appointment for evaluation and                            treatment. Sandor Flatter, MD 10/05/2024 1:49:46 PM

## 2024-10-05 NOTE — Patient Instructions (Signed)
-   Resume previous diet. - Continue present medications. - Await pathology results. - Repeat colonoscopy for surveillance based on pathology results. - Return to GI office PRN. - Internal hemorrhoids were noted on this study and may be amenable to hemorrhoid band ligation. If you are interested in further treatment of these hemorrhoids with band ligation, please contact my clinic to set up an appointment for evaluation and treatment.  YOU HAD AN ENDOSCOPIC PROCEDURE TODAY AT THE [facility]:   Refer to the procedure report that was given to you for any specific questions about what was found during the examination.  If the procedure report does not answer your questions, please call your gastroenterologist to clarify.  If you requested that your care partner not be given the details of your procedure findings, then the procedure report has been included in a sealed envelope for you to review at your convenience later.  YOU SHOULD EXPECT: Some feelings of bloating in the abdomen. Passage of more gas than usual.  Walking can help get rid of the air that was put into your GI tract during the procedure and reduce the bloating. If you had a lower endoscopy (such as a colonoscopy or flexible sigmoidoscopy) you may notice spotting of blood in your stool or on the toilet paper. If you underwent a bowel prep for your procedure, you may not have a normal bowel movement for a few days.  Please Note:  You might notice some irritation and congestion in your nose or some drainage.  This is from the oxygen used during your procedure.  There is no need for concern and it should clear up in a day or so.  SYMPTOMS TO REPORT IMMEDIATELY:  Following lower endoscopy (colonoscopy or flexible sigmoidoscopy):  Excessive amounts of blood in the stool  Significant tenderness or worsening of abdominal pains  Swelling of the abdomen that is new, acute  Fever of 100F or higher  For urgent or emergent issues, a  gastroenterologist can be reached at any hour by calling [phone]. Do not use MyChart messaging for urgent concerns.    DIET:  We do recommend a small meal at first, but then you may proceed to your regular diet.  Drink plenty of fluids but you should avoid alcoholic beverages for 24 hours.  ACTIVITY:  You should plan to take it easy for the rest of today and you should NOT DRIVE or use heavy machinery until tomorrow (because of the sedation medicines used during the test).    FOLLOW UP: Our staff will call the number listed on your records the next business day following your procedure.  We will call around 7:15- 8:00 am to check on you and address any questions or concerns that you may have regarding the information given to you following your procedure. If we do not reach you, we will leave a message.     If any biopsies were taken you will be contacted by phone or by letter within the next 1-3 weeks.  Please call us at [phone] if you have not heard about the biopsies in 3 weeks.    SIGNATURES/CONFIDENTIALITY: You and/or your care partner have signed paperwork which will be entered into your electronic medical record.  These signatures attest to the fact that that the information above on your After Visit Summary has been reviewed and is understood.  Full responsibility of the confidentiality of this discharge information lies with you and/or your care-partner.

## 2024-10-05 NOTE — Progress Notes (Signed)
 Vss nad trans to pacu

## 2024-10-05 NOTE — Progress Notes (Signed)
 Called to room to assist during endoscopic procedure.  Patient ID and intended procedure confirmed with present staff. Received instructions for my participation in the procedure from the performing physician.

## 2024-10-05 NOTE — Telephone Encounter (Signed)
 Called patient to communicate her Vitamin B12 levels from [facility] labs that were sent over.  Per Dr Loretha patient needs to take oral B 12 1000 mcg per day.   Communicated this to the patient.  Also requested that scheduler reach out to patient to schedule iron  infusion for [facility].

## 2024-10-05 NOTE — Progress Notes (Signed)
 GASTROENTEROLOGY PROCEDURE H&P NOTE   Primary Care Physician: Teresa Channel, MD    Reason for Procedure:  Abnormal rectal exam, hematochezia, history of colon polyps  Plan:    Colonoscopy  Patient is appropriate for endoscopic procedure(s) in the ambulatory ([facility]) setting.  The nature of the procedure, as well as the risks, benefits, and alternatives were carefully and thoroughly reviewed with the patient. Ample time for discussion and questions allowed. The patient understood, was satisfied, and agreed to proceed.     HPI: Katelyn Walker is a 53 y.o. female who presents for colonoscopy for evaluation of hematochezia.  Patient was most recently seen in the Gastroenterology Clinic on 09/07/2024 by Alan Coombs, PA-C.  Rectal exam at that time with prolapsing hemorrhoid and bulge, and referred for expedited colonoscopy.  She does have a history of colon polyps on last colonoscopy in 08/2018.  Otherwise, no interval change in medical history since that appointment. Please refer to that note for full details regarding GI history and clinical presentation.   Past Medical History:  Diagnosis Date   Anemia    Back pain    Dyspnea    Elevated blood pressure reading without diagnosis of hypertension    Esophageal ulcer    GERD (gastroesophageal reflux disease)    Hot flashes    Hypothyroid    Joint pain    Obesity 2020   started mounjero 2020   Sciatica, left side    Vitamin B 12 deficiency    Vitamin D  deficiency     Past Surgical History:  Procedure Laterality Date   36 HOUR PH STUDY N/A 12/13/2021   Procedure: 24 HOUR PH STUDY;  Surgeon: San Sandor GAILS, DO;  Location: [facility];  Service: Endoscopy;  Laterality: N/A;   ANKLE FRACTURE SURGERY Left 07/18/2021   3 places fibula   COLONOSCOPY  09/04/2018   DILATION AND CURETTAGE OF UTERUS     2011    ESOPHAGEAL MANOMETRY N/A 12/13/2021   Procedure: ESOPHAGEAL MANOMETRY (EM);  Surgeon: San Sandor GAILS, DO;   Location: [facility];  Service: Endoscopy;  Laterality: N/A;   L4/L5 microdisc Left 2019   OVARIAN CYST SURGERY     2009, 2013   [facility] IMPEDANCE STUDY N/A 12/13/2021   Procedure: PH IMPEDANCE STUDY;  Surgeon: San Sandor GAILS, DO;  Location: [facility];  Service: Endoscopy;  Laterality: N/A;    Prior to Admission medications   Medication Sig Start Date End Date Taking? Authorizing Provider  buPROPion  (WELLBUTRIN  XL) 150 MG 24 hr tablet Take 1 tablet (150 mg total) by mouth daily. 05/08/22  Yes Danford, Rockie D, NP  estradiol (ESTRACE) 2 MG tablet Take 2 mg by mouth daily.   Yes [provider]  omeprazole  (PRILOSEC) 40 MG capsule Take 1 capsule (40 mg total) by mouth daily. 09/07/24  Yes Coombs Alan SAUNDERS, PA-C  progesterone (PROMETRIUM) 100 MG capsule Take 100 mg by mouth daily.   Yes [provider]  ZEPBOUND  12.5 MG/0.5ML Pen SMARTSIG:12.5 Milligram(s) SUB-Q Once a Week 06/08/24  Yes [provider]  cyanocobalamin  (VITAMIN B12) 1000 MCG/ML injection Inject 1 mL (1,000 mcg total) into the muscle every 30 (thirty) days. 10/09/22   Iruku, Praveena, MD  hydrocortisone  (ANUSOL -HC) 2.5 % rectal cream Place 1 Application rectally 2 (two) times daily. 09/07/24   Coombs Alan SAUNDERS, PA-C  hydrocortisone  (ANUSOL -HC) 25 MG suppository Place 1 suppository (25 mg total) rectally 2 (two) times daily. 09/07/24   Coombs Alan SAUNDERS, PA-C  mupirocin   ointment (BACTROBAN ) 2 % Apply 1 application. topically 2 (two) times daily. 03/19/22   Prentiss Frieze, DO  nystatin  ointment (MYCOSTATIN ) Apply 1 application. topically 2 (two) times daily as needed. 03/19/22   Prentiss Frieze, DO  ondansetron  (ZOFRAN ) 4 MG tablet Take 4 mg by mouth every 6 (six) hours as needed. 06/17/23   [provider]  phentermine  (ADIPEX-P ) 37.5 MG tablet Take 18.75-37.5 mg by mouth daily. 08/01/23   [provider]  SYRINGE/NEEDLE, DISP, 1 ML (B-D 1CC SLIP TIP SYR 25GX5/8) 25G X 5/8 1 ML MISC Use to  administer b12 injections 10/09/22   Iruku, Praveena, MD  traZODone  (DESYREL ) 50 MG tablet Take 0.5-1 tablets (25-50 mg total) by mouth at bedtime as needed for sleep. 04/11/21   Prentiss Frieze, DO  Vitamin D , Ergocalciferol , (DRISDOL ) 1.25 MG (50000 UNIT) CAPS capsule Take 1 capsule (50,000 Units total) by mouth every 7 (seven) days. 05/09/22   Jonel Pee D, NP    Current Outpatient Medications  Medication Sig Dispense Refill   buPROPion  (WELLBUTRIN  XL) 150 MG 24 hr tablet Take 1 tablet (150 mg total) by mouth daily. 30 tablet 0   estradiol (ESTRACE) 2 MG tablet Take 2 mg by mouth daily.     omeprazole  (PRILOSEC) 40 MG capsule Take 1 capsule (40 mg total) by mouth daily. 90 capsule 3   progesterone (PROMETRIUM) 100 MG capsule Take 100 mg by mouth daily.     ZEPBOUND  12.5 MG/0.5ML Pen SMARTSIG:12.5 Milligram(s) SUB-Q Once a Week     cyanocobalamin  (VITAMIN B12) 1000 MCG/ML injection Inject 1 mL (1,000 mcg total) into the muscle every 30 (thirty) days. 10 mL 0   hydrocortisone  (ANUSOL -HC) 2.5 % rectal cream Place 1 Application rectally 2 (two) times daily. 30 g 2   hydrocortisone  (ANUSOL -HC) 25 MG suppository Place 1 suppository (25 mg total) rectally 2 (two) times daily. 12 suppository 0   mupirocin  ointment (BACTROBAN ) 2 % Apply 1 application. topically 2 (two) times daily. 22 g 0   nystatin  ointment (MYCOSTATIN ) Apply 1 application. topically 2 (two) times daily as needed. 30 g 0   ondansetron  (ZOFRAN ) 4 MG tablet Take 4 mg by mouth every 6 (six) hours as needed.     phentermine  (ADIPEX-P ) 37.5 MG tablet Take 18.75-37.5 mg by mouth daily.     SYRINGE/NEEDLE, DISP, 1 ML (B-D 1CC SLIP TIP SYR 25GX5/8) 25G X 5/8 1 ML MISC Use to administer b12 injections 50 each 0   traZODone  (DESYREL ) 50 MG tablet Take 0.5-1 tablets (25-50 mg total) by mouth at bedtime as needed for sleep. 90 tablet 0   Vitamin D , Ergocalciferol , (DRISDOL ) 1.25 MG (50000 UNIT) CAPS capsule Take 1 capsule (50,000 Units total)  by mouth every 7 (seven) days. 8 capsule 0   Current Facility-Administered Medications  Medication Dose Route Frequency Provider Last Rate Last Admin   0.9 %  sodium chloride  infusion  500 mL Intravenous Once Hillarie Harrigan V, DO        Allergies as of 10/05/2024   (No Known Allergies)    Family History  Problem Relation Age of Onset   Alcoholism Mother    Eating disorder Mother    Colon polyps Father    Diabetes Father    Hypertension Father    Sleep apnea Father    Esophageal cancer Maternal Uncle    Colon cancer Neg Hx    Pancreatic cancer Neg Hx    Liver disease Neg Hx    Stomach cancer Neg Hx  Rectal cancer Neg Hx     Social History   Socioeconomic History   Marital status: Married    Spouse name: Marcey Care   Number of children: 1   Years of education: Not on file   Highest education level: Not on file  Occupational History   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  Tobacco Use   Smoking status: Never   Smokeless tobacco: Never  Vaping Use   Vaping status: Never Used  Substance and Sexual Activity   Alcohol use: Never   Drug use: Never   Sexual activity: Yes    Birth control/protection: Post-menopausal  Other Topics Concern   Not on file  Social History Narrative   Not on file   Social Drivers of Health   Financial Resource Strain: Not on file  Food Insecurity: Not on file  Transportation Needs: Not on file  Physical Activity: Not on file  Stress: Not on file  Social Connections: Not on file  Intimate Partner Violence: Not on file    Physical Exam: Vital signs in last 24 hours: @BP  112/70   Pulse 80   Temp 98.4 F (36.9 C) (Temporal)   Ht 5' 6 (1.676 m)   Wt 173 lb (78.5 kg)   LMP 03/20/2020 (Exact Date)   SpO2 100%   BMI 27.92 kg/m  GEN: NAD EYE: Sclerae anicteric ENT: MMM CV: Non-tachycardic Pulm: CTA b/l GI: Soft, NT/ND NEURO:  Alert & Oriented x 3   Sandor Flatter, DO [facility]   10/05/2024 1:19 PM

## 2024-10-06 ENCOUNTER — Telehealth: Payer: Self-pay

## 2024-10-06 ENCOUNTER — Encounter: Payer: Self-pay | Admitting: Family Medicine

## 2024-10-06 NOTE — Telephone Encounter (Signed)
 Attempted f/u call. No answer, left VM.

## 2024-10-07 ENCOUNTER — Encounter: Admitting: Gastroenterology

## 2024-10-08 LAB — SURGICAL PATHOLOGY

## 2024-10-09 ENCOUNTER — Ambulatory Visit: Payer: Self-pay | Admitting: Gastroenterology

## 2024-10-15 ENCOUNTER — Inpatient Hospital Stay: Attending: Hematology and Oncology

## 2024-10-15 VITALS — BP 99/66 | HR 69 | Temp 98.1°F | Resp 16

## 2024-10-15 DIAGNOSIS — E611 Iron deficiency: Secondary | ICD-10-CM

## 2024-10-15 DIAGNOSIS — D509 Iron deficiency anemia, unspecified: Secondary | ICD-10-CM | POA: Insufficient documentation

## 2024-10-15 MED ORDER — IRON SUCROSE 20 MG/ML IV SOLN
200.0000 mg | Freq: Once | INTRAVENOUS | Status: AC
  Administered 2024-10-15: 200 mg via INTRAVENOUS
  Filled 2024-10-15: qty 10

## 2024-10-15 NOTE — Patient Instructions (Addendum)
 Iron  Sucrose Injection What is this medication? IRON  SUCROSE (EYE ern SOO krose) treats low levels of iron  (iron  deficiency anemia) in people with kidney disease. Iron  is a mineral that plays an important role in making red blood cells, which carry oxygen from your lungs to the rest of your body. This medicine may be used for other purposes; ask your health care provider or pharmacist if you have questions. COMMON BRAND NAME(S): Venofer  What should I tell my care team before I take this medication? They need to know if you have any of these conditions: Anemia not caused by low iron  levels Heart disease High levels of iron  in the blood Kidney disease Liver disease An unusual or allergic reaction to iron , other medications, foods, dyes, or preservatives Pregnant or trying to get pregnant Breastfeeding How should I use this medication? This medication is infused into a vein. It is given by your care team in a hospital or clinic setting. Talk to your care team about the use of this medication in children. While it may be prescribed for children as young as 2 years for selected conditions, precautions do apply. Overdosage: If you think you have taken too much of this medicine contact a poison control center or emergency room at once. NOTE: This medicine is only for you. Do not share this medicine with others. What if I miss a dose? Keep appointments for follow-up doses. It is important not to miss your dose. Call your care team if you are unable to keep an appointment. What may interact with this medication? Do not take this medication with any of the following: Deferoxamine Dimercaprol Other iron  products This medication may also interact with the following: Chloramphenicol Deferasirox This list may not describe all possible interactions. Give your health care provider a list of all the medicines, herbs, non-prescription drugs, or dietary supplements you use. Also tell them if you smoke,  drink alcohol, or use illegal drugs. Some items may interact with your medicine. What should I watch for while using this medication? Visit your care team for regular checks on your progress. Tell your care team if your symptoms do not start to get better or if they get worse. You may need blood work done while you are taking this medication. You may need to eat more foods that contain iron . Talk to your care team. Foods that contain iron  include whole grains or cereals, dried fruits, beans, peas, leafy green vegetables, and organ meats (liver, kidney). What side effects may I notice from receiving this medication? Side effects that you should report to your care team as soon as possible: Allergic reactions--skin rash, itching, hives, swelling of the face, lips, tongue, or throat Low blood pressure--dizziness, feeling faint or lightheaded, blurry vision Shortness of breath Side effects that usually do not require medical attention (report to your care team if they continue or are bothersome): Flushing Headache Joint pain Muscle pain Nausea Pain, redness, or irritation at injection site This list may not describe all possible side effects. Call your doctor for medical advice about side effects. You may report side effects to FDA at 1-800-FDA-1088. Where should I keep my medication? This medication is given in a hospital or clinic. It will not be stored at home. NOTE: This sheet is a summary. It may not cover all possible information. If you have questions about this medicine, talk to your doctor, pharmacist, or health care provider.  2024 Elsevier/Gold Standard (2023-07-24 00:00:00)Iron  Deficiency Anemia, Adult  Iron  deficiency anemia is  a condition in which the concentration of red blood cells or hemoglobin in the blood is below normal because of too little iron . Hemoglobin is a substance in red blood cells that carries oxygen to the body's tissues. When the concentration of red blood cells  or hemoglobin is too low, not enough oxygen reaches these tissues. Iron  deficiency anemia is usually long-lasting, and it develops over time. It may or may not cause symptoms. It is a common type of anemia. What are the causes? This condition may be caused by: Not enough iron  in the diet. Abnormal absorption in the gut. Blood loss. What increases the risk? You are more likely to develop this condition if you get menstrual periods (menstruate) or are pregnant. What are the signs or symptoms? Symptoms of this condition may include: Pale skin, lips, and nail beds. Weakness, dizziness, and getting tired easily. Shortness of breath when moving or exercising. Cold hands or feet. Mild anemia may not cause any symptoms. How is this diagnosed? This condition is diagnosed based on: Your medical history. A physical exam. Blood tests. How is this treated? This condition is treated by correcting the cause of your iron  deficiency. Treatment may involve: Adding iron -rich foods to your diet. Taking iron  supplements. If you are pregnant or breastfeeding, you may need to take extra iron  because your normal diet usually does not provide the amount of iron  that you need. Increasing vitamin C intake. Vitamin C helps your body absorb iron . Your health care provider may recommend that you take iron  supplements along with a glass of orange juice or a vitamin C supplement. Medicines to make heavy menstrual flow lighter. Surgery or additional testing procedures to determine the cause of your anemia. You may need repeat blood tests to determine whether treatment is working. If the treatment does not seem to be working, you may need more tests. Follow these instructions at home: Medicines Take over-the-counter and prescription medicines only as told by your health care provider. This includes iron  supplements and vitamins. This is important because too much iron  can be harmful. For the best iron  absorption, you  should take iron  supplements when your stomach is empty. If you cannot tolerate them on an empty stomach, you may need to take them with food. Do not drink milk or take antacids at the same time as your iron  supplements. Milk and antacids may interfere with how your body absorbs iron . Iron  supplements may turn stool (feces) a darker color and it may appear black. If you cannot tolerate taking iron  supplements by mouth, talk with your health care provider about taking them through an IV or through an injection into a muscle. Eating and drinking Talk with your health care provider before changing your diet. Your provider may recommend that you eat foods that contain a lot of iron , such as: Liver. Low-fat (lean) beef. Breads and cereals that have iron  added to them (are fortified). Eggs. Dried fruit. Dark green, leafy vegetables. To help your body use the iron  from iron -rich foods, eat those foods at the same time as fresh fruits and vegetables that are high in vitamin C. Foods that are high in vitamin C include: Oranges. Peppers. Tomatoes. Mangoes. Managing constipation If you are taking an iron  supplement, it may cause constipation. To prevent or treat constipation, you may need to: Drink enough fluid to keep your urine pale yellow. Take over-the-counter or prescription medicines. Eat foods that are high in fiber, such as beans, whole grains, and fresh fruits and  vegetables. Limit foods that are high in fat and processed sugars, such as fried or sweet foods. General instructions Return to your normal activities as told by your health care provider. Ask your health care provider what activities are safe for you. Keep all follow-up visits. Contact a health care provider if: You feel nauseous or you vomit. You feel weak. You become light-headed when getting up from a sitting or lying down position. You have unexplained sweating. You develop symptoms of constipation. You have a heaviness  in your chest. You have trouble breathing with physical activity. Get help right away if: You faint. If this happens, do not drive yourself to the hospital. You have an irregular or rapid heartbeat. Summary Iron  deficiency anemia is a condition in which the concentration of red blood cells or hemoglobin in the blood is below normal because of too little iron . This condition is treated by correcting the cause of your iron  deficiency. Take over-the-counter and prescription medicines only as told by your health care provider. This includes iron  supplements and vitamins. To help your body use the iron  from iron -rich foods, eat those foods at the same time as fresh fruits and vegetables that are high in vitamin C. Seek medical help if you have signs or symptoms of worsening anemia. This information is not intended to replace advice given to you by your health care provider. Make sure you discuss any questions you have with your health care provider. Document Revised: 01/10/2022 Document Reviewed: 01/10/2022 Elsevier Patient Education  2024 [employer].

## 2024-10-19 ENCOUNTER — Inpatient Hospital Stay: Attending: Hematology and Oncology

## 2024-10-19 VITALS — BP 116/78 | HR 76 | Temp 98.5°F | Resp 16 | Wt 176.5 lb

## 2024-10-19 DIAGNOSIS — D509 Iron deficiency anemia, unspecified: Secondary | ICD-10-CM | POA: Insufficient documentation

## 2024-10-19 DIAGNOSIS — Z79899 Other long term (current) drug therapy: Secondary | ICD-10-CM | POA: Insufficient documentation

## 2024-10-19 DIAGNOSIS — E611 Iron deficiency: Secondary | ICD-10-CM

## 2024-10-19 MED ORDER — SODIUM CHLORIDE 0.9 % IV SOLN: INTRAVENOUS | Status: DC

## 2024-10-19 MED ORDER — IRON SUCROSE 20 MG/ML IV SOLN
200.0000 mg | Freq: Once | INTRAVENOUS | Status: AC
  Administered 2024-10-19: 200 mg via INTRAVENOUS
  Filled 2024-10-19: qty 10

## 2024-10-19 NOTE — Patient Instructions (Signed)
 Iron  Sucrose Injection What is this medication? IRON  SUCROSE (EYE ern SOO krose) treats low levels of iron  (iron  deficiency anemia) in people with kidney disease. Iron  is a mineral that plays an important role in making red blood cells, which carry oxygen from your lungs to the rest of your body. This medicine may be used for other purposes; ask your health care provider or pharmacist if you have questions. COMMON BRAND NAME(S): Venofer  What should I tell my care team before I take this medication? They need to know if you have any of these conditions: Anemia not caused by low iron  levels Heart disease High levels of iron  in the blood Kidney disease Liver disease An unusual or allergic reaction to iron , other medications, foods, dyes, or preservatives Pregnant or trying to get pregnant Breastfeeding How should I use this medication? This medication is infused into a vein. It is given by your care team in a hospital or clinic setting. Talk to your care team about the use of this medication in children. While it may be prescribed for children as young as 2 years for selected conditions, precautions do apply. Overdosage: If you think you have taken too much of this medicine contact a poison control center or emergency room at once. NOTE: This medicine is only for you. Do not share this medicine with others. What if I miss a dose? Keep appointments for follow-up doses. It is important not to miss your dose. Call your care team if you are unable to keep an appointment. What may interact with this medication? Do not take this medication with any of the following: Deferoxamine Dimercaprol Other iron  products This medication may also interact with the following: Chloramphenicol Deferasirox This list may not describe all possible interactions. Give your health care provider a list of all the medicines, herbs, non-prescription drugs, or dietary supplements you use. Also tell them if you smoke,  drink alcohol, or use illegal drugs. Some items may interact with your medicine. What should I watch for while using this medication? Visit your care team for regular checks on your progress. Tell your care team if your symptoms do not start to get better or if they get worse. You may need blood work done while you are taking this medication. You may need to eat more foods that contain iron . Talk to your care team. Foods that contain iron  include whole grains or cereals, dried fruits, beans, peas, leafy green vegetables, and organ meats (liver, kidney). What side effects may I notice from receiving this medication? Side effects that you should report to your care team as soon as possible: Allergic reactions--skin rash, itching, hives, swelling of the face, lips, tongue, or throat Low blood pressure--dizziness, feeling faint or lightheaded, blurry vision Shortness of breath Side effects that usually do not require medical attention (report to your care team if they continue or are bothersome): Flushing Headache Joint pain Muscle pain Nausea Pain, redness, or irritation at injection site This list may not describe all possible side effects. Call your doctor for medical advice about side effects. You may report side effects to FDA at 1-800-FDA-1088. Where should I keep my medication? This medication is given in a hospital or clinic. It will not be stored at home. NOTE: This sheet is a summary. It may not cover all possible information. If you have questions about this medicine, talk to your doctor, pharmacist, or health care provider.  2024 Elsevier/Gold Standard (2023-07-24 00:00:00)Iron  Deficiency Anemia, Adult  Iron  deficiency anemia is  a condition in which the concentration of red blood cells or hemoglobin in the blood is below normal because of too little iron . Hemoglobin is a substance in red blood cells that carries oxygen to the body's tissues. When the concentration of red blood cells  or hemoglobin is too low, not enough oxygen reaches these tissues. Iron  deficiency anemia is usually long-lasting, and it develops over time. It may or may not cause symptoms. It is a common type of anemia. What are the causes? This condition may be caused by: Not enough iron  in the diet. Abnormal absorption in the gut. Blood loss. What increases the risk? You are more likely to develop this condition if you get menstrual periods (menstruate) or are pregnant. What are the signs or symptoms? Symptoms of this condition may include: Pale skin, lips, and nail beds. Weakness, dizziness, and getting tired easily. Shortness of breath when moving or exercising. Cold hands or feet. Mild anemia may not cause any symptoms. How is this diagnosed? This condition is diagnosed based on: Your medical history. A physical exam. Blood tests. How is this treated? This condition is treated by correcting the cause of your iron  deficiency. Treatment may involve: Adding iron -rich foods to your diet. Taking iron  supplements. If you are pregnant or breastfeeding, you may need to take extra iron  because your normal diet usually does not provide the amount of iron  that you need. Increasing vitamin C intake. Vitamin C helps your body absorb iron . Your health care provider may recommend that you take iron  supplements along with a glass of orange juice or a vitamin C supplement. Medicines to make heavy menstrual flow lighter. Surgery or additional testing procedures to determine the cause of your anemia. You may need repeat blood tests to determine whether treatment is working. If the treatment does not seem to be working, you may need more tests. Follow these instructions at home: Medicines Take over-the-counter and prescription medicines only as told by your health care provider. This includes iron  supplements and vitamins. This is important because too much iron  can be harmful. For the best iron  absorption, you  should take iron  supplements when your stomach is empty. If you cannot tolerate them on an empty stomach, you may need to take them with food. Do not drink milk or take antacids at the same time as your iron  supplements. Milk and antacids may interfere with how your body absorbs iron . Iron  supplements may turn stool (feces) a darker color and it may appear black. If you cannot tolerate taking iron  supplements by mouth, talk with your health care provider about taking them through an IV or through an injection into a muscle. Eating and drinking Talk with your health care provider before changing your diet. Your provider may recommend that you eat foods that contain a lot of iron , such as: Liver. Low-fat (lean) beef. Breads and cereals that have iron  added to them (are fortified). Eggs. Dried fruit. Dark green, leafy vegetables. To help your body use the iron  from iron -rich foods, eat those foods at the same time as fresh fruits and vegetables that are high in vitamin C. Foods that are high in vitamin C include: Oranges. Peppers. Tomatoes. Mangoes. Managing constipation If you are taking an iron  supplement, it may cause constipation. To prevent or treat constipation, you may need to: Drink enough fluid to keep your urine pale yellow. Take over-the-counter or prescription medicines. Eat foods that are high in fiber, such as beans, whole grains, and fresh fruits and  vegetables. Limit foods that are high in fat and processed sugars, such as fried or sweet foods. General instructions Return to your normal activities as told by your health care provider. Ask your health care provider what activities are safe for you. Keep all follow-up visits. Contact a health care provider if: You feel nauseous or you vomit. You feel weak. You become light-headed when getting up from a sitting or lying down position. You have unexplained sweating. You develop symptoms of constipation. You have a heaviness  in your chest. You have trouble breathing with physical activity. Get help right away if: You faint. If this happens, do not drive yourself to the hospital. You have an irregular or rapid heartbeat. Summary Iron  deficiency anemia is a condition in which the concentration of red blood cells or hemoglobin in the blood is below normal because of too little iron . This condition is treated by correcting the cause of your iron  deficiency. Take over-the-counter and prescription medicines only as told by your health care provider. This includes iron  supplements and vitamins. To help your body use the iron  from iron -rich foods, eat those foods at the same time as fresh fruits and vegetables that are high in vitamin C. Seek medical help if you have signs or symptoms of worsening anemia. This information is not intended to replace advice given to you by your health care provider. Make sure you discuss any questions you have with your health care provider. Document Revised: 01/10/2022 Document Reviewed: 01/10/2022 Elsevier Patient Education  2024 [employer].

## 2024-10-21 ENCOUNTER — Inpatient Hospital Stay

## 2024-10-21 ENCOUNTER — Encounter: Payer: Self-pay | Admitting: Hematology and Oncology

## 2024-10-21 VITALS — BP 104/73 | HR 72 | Temp 97.7°F | Resp 16 | Wt 178.0 lb

## 2024-10-21 DIAGNOSIS — E611 Iron deficiency: Secondary | ICD-10-CM

## 2024-10-21 DIAGNOSIS — D509 Iron deficiency anemia, unspecified: Secondary | ICD-10-CM | POA: Diagnosis not present

## 2024-10-21 DIAGNOSIS — Z79899 Other long term (current) drug therapy: Secondary | ICD-10-CM | POA: Diagnosis not present

## 2024-10-21 MED ORDER — IRON SUCROSE 20 MG/ML IV SOLN
200.0000 mg | Freq: Once | INTRAVENOUS | Status: AC
  Administered 2024-10-21: 200 mg via INTRAVENOUS
  Filled 2024-10-21: qty 10

## 2024-10-21 MED ORDER — SODIUM CHLORIDE 0.9 % IV SOLN: INTRAVENOUS | Status: DC

## 2024-10-21 NOTE — Patient Instructions (Signed)
 Iron  Sucrose Injection What is this medication? IRON  SUCROSE (EYE ern SOO krose) treats low levels of iron  (iron  deficiency anemia) in people with kidney disease. Iron  is a mineral that plays an important role in making red blood cells, which carry oxygen from your lungs to the rest of your body. This medicine may be used for other purposes; ask your health care provider or pharmacist if you have questions. COMMON BRAND NAME(S): Venofer  What should I tell my care team before I take this medication? They need to know if you have any of these conditions: Anemia not caused by low iron  levels Heart disease High levels of iron  in the blood Kidney disease Liver disease An unusual or allergic reaction to iron , other medications, foods, dyes, or preservatives Pregnant or trying to get pregnant Breastfeeding How should I use this medication? This medication is infused into a vein. It is given by your care team in a hospital or clinic setting. Talk to your care team about the use of this medication in children. While it may be prescribed for children as young as 2 years for selected conditions, precautions do apply. Overdosage: If you think you have taken too much of this medicine contact a poison control center or emergency room at once. NOTE: This medicine is only for you. Do not share this medicine with others. What if I miss a dose? Keep appointments for follow-up doses. It is important not to miss your dose. Call your care team if you are unable to keep an appointment. What may interact with this medication? Do not take this medication with any of the following: Deferoxamine Dimercaprol Other iron  products This medication may also interact with the following: Chloramphenicol Deferasirox This list may not describe all possible interactions. Give your health care provider a list of all the medicines, herbs, non-prescription drugs, or dietary supplements you use. Also tell them if you smoke,  drink alcohol, or use illegal drugs. Some items may interact with your medicine. What should I watch for while using this medication? Visit your care team for regular checks on your progress. Tell your care team if your symptoms do not start to get better or if they get worse. You may need blood work done while you are taking this medication. You may need to eat more foods that contain iron . Talk to your care team. Foods that contain iron  include whole grains or cereals, dried fruits, beans, peas, leafy green vegetables, and organ meats (liver, kidney). What side effects may I notice from receiving this medication? Side effects that you should report to your care team as soon as possible: Allergic reactions--skin rash, itching, hives, swelling of the face, lips, tongue, or throat Low blood pressure--dizziness, feeling faint or lightheaded, blurry vision Shortness of breath Side effects that usually do not require medical attention (report to your care team if they continue or are bothersome): Flushing Headache Joint pain Muscle pain Nausea Pain, redness, or irritation at injection site This list may not describe all possible side effects. Call your doctor for medical advice about side effects. You may report side effects to FDA at 1-800-FDA-1088. Where should I keep my medication? This medication is given in a hospital or clinic. It will not be stored at home. NOTE: This sheet is a summary. It may not cover all possible information. If you have questions about this medicine, talk to your doctor, pharmacist, or health care provider.  2024 Elsevier/Gold Standard (2023-07-24 00:00:00)Iron  Deficiency Anemia, Adult  Iron  deficiency anemia is  a condition in which the concentration of red blood cells or hemoglobin in the blood is below normal because of too little iron . Hemoglobin is a substance in red blood cells that carries oxygen to the body's tissues. When the concentration of red blood cells  or hemoglobin is too low, not enough oxygen reaches these tissues. Iron  deficiency anemia is usually long-lasting, and it develops over time. It may or may not cause symptoms. It is a common type of anemia. What are the causes? This condition may be caused by: Not enough iron  in the diet. Abnormal absorption in the gut. Blood loss. What increases the risk? You are more likely to develop this condition if you get menstrual periods (menstruate) or are pregnant. What are the signs or symptoms? Symptoms of this condition may include: Pale skin, lips, and nail beds. Weakness, dizziness, and getting tired easily. Shortness of breath when moving or exercising. Cold hands or feet. Mild anemia may not cause any symptoms. How is this diagnosed? This condition is diagnosed based on: Your medical history. A physical exam. Blood tests. How is this treated? This condition is treated by correcting the cause of your iron  deficiency. Treatment may involve: Adding iron -rich foods to your diet. Taking iron  supplements. If you are pregnant or breastfeeding, you may need to take extra iron  because your normal diet usually does not provide the amount of iron  that you need. Increasing vitamin C intake. Vitamin C helps your body absorb iron . Your health care provider may recommend that you take iron  supplements along with a glass of orange juice or a vitamin C supplement. Medicines to make heavy menstrual flow lighter. Surgery or additional testing procedures to determine the cause of your anemia. You may need repeat blood tests to determine whether treatment is working. If the treatment does not seem to be working, you may need more tests. Follow these instructions at home: Medicines Take over-the-counter and prescription medicines only as told by your health care provider. This includes iron  supplements and vitamins. This is important because too much iron  can be harmful. For the best iron  absorption, you  should take iron  supplements when your stomach is empty. If you cannot tolerate them on an empty stomach, you may need to take them with food. Do not drink milk or take antacids at the same time as your iron  supplements. Milk and antacids may interfere with how your body absorbs iron . Iron  supplements may turn stool (feces) a darker color and it may appear black. If you cannot tolerate taking iron  supplements by mouth, talk with your health care provider about taking them through an IV or through an injection into a muscle. Eating and drinking Talk with your health care provider before changing your diet. Your provider may recommend that you eat foods that contain a lot of iron , such as: Liver. Low-fat (lean) beef. Breads and cereals that have iron  added to them (are fortified). Eggs. Dried fruit. Dark green, leafy vegetables. To help your body use the iron  from iron -rich foods, eat those foods at the same time as fresh fruits and vegetables that are high in vitamin C. Foods that are high in vitamin C include: Oranges. Peppers. Tomatoes. Mangoes. Managing constipation If you are taking an iron  supplement, it may cause constipation. To prevent or treat constipation, you may need to: Drink enough fluid to keep your urine pale yellow. Take over-the-counter or prescription medicines. Eat foods that are high in fiber, such as beans, whole grains, and fresh fruits and  vegetables. Limit foods that are high in fat and processed sugars, such as fried or sweet foods. General instructions Return to your normal activities as told by your health care provider. Ask your health care provider what activities are safe for you. Keep all follow-up visits. Contact a health care provider if: You feel nauseous or you vomit. You feel weak. You become light-headed when getting up from a sitting or lying down position. You have unexplained sweating. You develop symptoms of constipation. You have a heaviness  in your chest. You have trouble breathing with physical activity. Get help right away if: You faint. If this happens, do not drive yourself to the hospital. You have an irregular or rapid heartbeat. Summary Iron  deficiency anemia is a condition in which the concentration of red blood cells or hemoglobin in the blood is below normal because of too little iron . This condition is treated by correcting the cause of your iron  deficiency. Take over-the-counter and prescription medicines only as told by your health care provider. This includes iron  supplements and vitamins. To help your body use the iron  from iron -rich foods, eat those foods at the same time as fresh fruits and vegetables that are high in vitamin C. Seek medical help if you have signs or symptoms of worsening anemia. This information is not intended to replace advice given to you by your health care provider. Make sure you discuss any questions you have with your health care provider. Document Revised: 01/10/2022 Document Reviewed: 01/10/2022 Elsevier Patient Education  2024 [employer].

## 2024-10-22 ENCOUNTER — Encounter: Admitting: Gastroenterology

## 2024-10-23 ENCOUNTER — Inpatient Hospital Stay

## 2024-10-23 VITALS — BP 118/69 | HR 70 | Temp 98.0°F | Resp 17

## 2024-10-23 DIAGNOSIS — Z79899 Other long term (current) drug therapy: Secondary | ICD-10-CM | POA: Diagnosis not present

## 2024-10-23 DIAGNOSIS — D509 Iron deficiency anemia, unspecified: Secondary | ICD-10-CM | POA: Diagnosis not present

## 2024-10-23 DIAGNOSIS — E611 Iron deficiency: Secondary | ICD-10-CM

## 2024-10-23 MED ORDER — IRON SUCROSE 20 MG/ML IV SOLN
200.0000 mg | Freq: Once | INTRAVENOUS | Status: AC
  Administered 2024-10-23: 200 mg via INTRAVENOUS
  Filled 2024-10-23: qty 10

## 2024-10-23 MED ORDER — SODIUM CHLORIDE 0.9 % IV SOLN: INTRAVENOUS | Status: DC

## 2024-10-23 NOTE — Patient Instructions (Signed)

## 2024-10-26 ENCOUNTER — Inpatient Hospital Stay

## 2024-10-26 VITALS — BP 112/75 | HR 79 | Temp 98.4°F | Resp 16

## 2024-10-26 DIAGNOSIS — Z79899 Other long term (current) drug therapy: Secondary | ICD-10-CM | POA: Diagnosis not present

## 2024-10-26 DIAGNOSIS — D509 Iron deficiency anemia, unspecified: Secondary | ICD-10-CM | POA: Diagnosis not present

## 2024-10-26 DIAGNOSIS — E611 Iron deficiency: Secondary | ICD-10-CM

## 2024-10-26 MED ORDER — SODIUM CHLORIDE 0.9 % IV SOLN: INTRAVENOUS | Status: DC

## 2024-10-26 MED ORDER — IRON SUCROSE 20 MG/ML IV SOLN
200.0000 mg | Freq: Once | INTRAVENOUS | Status: AC
  Administered 2024-10-26: 200 mg via INTRAVENOUS
  Filled 2024-10-26: qty 10

## 2024-10-26 NOTE — Patient Instructions (Signed)
 Iron  Sucrose Injection What is this medication? IRON  SUCROSE (EYE ern SOO krose) treats low levels of iron  (iron  deficiency anemia) in people with kidney disease. Iron  is a mineral that plays an important role in making red blood cells, which carry oxygen from your lungs to the rest of your body. This medicine may be used for other purposes; ask your health care provider or pharmacist if you have questions. COMMON BRAND NAME(S): Venofer  What should I tell my care team before I take this medication? They need to know if you have any of these conditions: Anemia not caused by low iron  levels Heart disease High levels of iron  in the blood Kidney disease Liver disease An unusual or allergic reaction to iron , other medications, foods, dyes, or preservatives Pregnant or trying to get pregnant Breastfeeding How should I use this medication? This medication is infused into a vein. It is given by your care team in a hospital or clinic setting. Talk to your care team about the use of this medication in children. While it may be prescribed for children as young as 2 years for selected conditions, precautions do apply. Overdosage: If you think you have taken too much of this medicine contact a poison control center or emergency room at once. NOTE: This medicine is only for you. Do not share this medicine with others. What if I miss a dose? Keep appointments for follow-up doses. It is important not to miss your dose. Call your care team if you are unable to keep an appointment. What may interact with this medication? Do not take this medication with any of the following: Deferoxamine Dimercaprol Other iron  products This medication may also interact with the following: Chloramphenicol Deferasirox This list may not describe all possible interactions. Give your health care provider a list of all the medicines, herbs, non-prescription drugs, or dietary supplements you use. Also tell them if you smoke,  drink alcohol, or use illegal drugs. Some items may interact with your medicine. What should I watch for while using this medication? Visit your care team for regular checks on your progress. Tell your care team if your symptoms do not start to get better or if they get worse. You may need blood work done while you are taking this medication. You may need to eat more foods that contain iron . Talk to your care team. Foods that contain iron  include whole grains or cereals, dried fruits, beans, peas, leafy green vegetables, and organ meats (liver, kidney). What side effects may I notice from receiving this medication? Side effects that you should report to your care team as soon as possible: Allergic reactions--skin rash, itching, hives, swelling of the face, lips, tongue, or throat Low blood pressure--dizziness, feeling faint or lightheaded, blurry vision Shortness of breath Side effects that usually do not require medical attention (report to your care team if they continue or are bothersome): Flushing Headache Joint pain Muscle pain Nausea Pain, redness, or irritation at injection site This list may not describe all possible side effects. Call your doctor for medical advice about side effects. You may report side effects to FDA at 1-800-FDA-1088. Where should I keep my medication? This medication is given in a hospital or clinic. It will not be stored at home. NOTE: This sheet is a summary. It may not cover all possible information. If you have questions about this medicine, talk to your doctor, pharmacist, or health care provider.  2024 Elsevier/Gold Standard (2023-07-24 00:00:00)Iron  Deficiency Anemia, Adult  Iron  deficiency anemia is  a condition in which the concentration of red blood cells or hemoglobin in the blood is below normal because of too little iron . Hemoglobin is a substance in red blood cells that carries oxygen to the body's tissues. When the concentration of red blood cells  or hemoglobin is too low, not enough oxygen reaches these tissues. Iron  deficiency anemia is usually long-lasting, and it develops over time. It may or may not cause symptoms. It is a common type of anemia. What are the causes? This condition may be caused by: Not enough iron  in the diet. Abnormal absorption in the gut. Blood loss. What increases the risk? You are more likely to develop this condition if you get menstrual periods (menstruate) or are pregnant. What are the signs or symptoms? Symptoms of this condition may include: Pale skin, lips, and nail beds. Weakness, dizziness, and getting tired easily. Shortness of breath when moving or exercising. Cold hands or feet. Mild anemia may not cause any symptoms. How is this diagnosed? This condition is diagnosed based on: Your medical history. A physical exam. Blood tests. How is this treated? This condition is treated by correcting the cause of your iron  deficiency. Treatment may involve: Adding iron -rich foods to your diet. Taking iron  supplements. If you are pregnant or breastfeeding, you may need to take extra iron  because your normal diet usually does not provide the amount of iron  that you need. Increasing vitamin C intake. Vitamin C helps your body absorb iron . Your health care provider may recommend that you take iron  supplements along with a glass of orange juice or a vitamin C supplement. Medicines to make heavy menstrual flow lighter. Surgery or additional testing procedures to determine the cause of your anemia. You may need repeat blood tests to determine whether treatment is working. If the treatment does not seem to be working, you may need more tests. Follow these instructions at home: Medicines Take over-the-counter and prescription medicines only as told by your health care provider. This includes iron  supplements and vitamins. This is important because too much iron  can be harmful. For the best iron  absorption, you  should take iron  supplements when your stomach is empty. If you cannot tolerate them on an empty stomach, you may need to take them with food. Do not drink milk or take antacids at the same time as your iron  supplements. Milk and antacids may interfere with how your body absorbs iron . Iron  supplements may turn stool (feces) a darker color and it may appear black. If you cannot tolerate taking iron  supplements by mouth, talk with your health care provider about taking them through an IV or through an injection into a muscle. Eating and drinking Talk with your health care provider before changing your diet. Your provider may recommend that you eat foods that contain a lot of iron , such as: Liver. Low-fat (lean) beef. Breads and cereals that have iron  added to them (are fortified). Eggs. Dried fruit. Dark green, leafy vegetables. To help your body use the iron  from iron -rich foods, eat those foods at the same time as fresh fruits and vegetables that are high in vitamin C. Foods that are high in vitamin C include: Oranges. Peppers. Tomatoes. Mangoes. Managing constipation If you are taking an iron  supplement, it may cause constipation. To prevent or treat constipation, you may need to: Drink enough fluid to keep your urine pale yellow. Take over-the-counter or prescription medicines. Eat foods that are high in fiber, such as beans, whole grains, and fresh fruits and  vegetables. Limit foods that are high in fat and processed sugars, such as fried or sweet foods. General instructions Return to your normal activities as told by your health care provider. Ask your health care provider what activities are safe for you. Keep all follow-up visits. Contact a health care provider if: You feel nauseous or you vomit. You feel weak. You become light-headed when getting up from a sitting or lying down position. You have unexplained sweating. You develop symptoms of constipation. You have a heaviness  in your chest. You have trouble breathing with physical activity. Get help right away if: You faint. If this happens, do not drive yourself to the hospital. You have an irregular or rapid heartbeat. Summary Iron  deficiency anemia is a condition in which the concentration of red blood cells or hemoglobin in the blood is below normal because of too little iron . This condition is treated by correcting the cause of your iron  deficiency. Take over-the-counter and prescription medicines only as told by your health care provider. This includes iron  supplements and vitamins. To help your body use the iron  from iron -rich foods, eat those foods at the same time as fresh fruits and vegetables that are high in vitamin C. Seek medical help if you have signs or symptoms of worsening anemia. This information is not intended to replace advice given to you by your health care provider. Make sure you discuss any questions you have with your health care provider. Document Revised: 01/10/2022 Document Reviewed: 01/10/2022 Elsevier Patient Education  2024 [employer].

## 2024-10-29 DIAGNOSIS — Z1231 Encounter for screening mammogram for malignant neoplasm of breast: Secondary | ICD-10-CM | POA: Diagnosis not present

## 2024-10-29 DIAGNOSIS — Z6828 Body mass index (BMI) 28.0-28.9, adult: Secondary | ICD-10-CM | POA: Diagnosis not present

## 2024-10-29 DIAGNOSIS — Z01419 Encounter for gynecological examination (general) (routine) without abnormal findings: Secondary | ICD-10-CM | POA: Diagnosis not present

## 2024-11-16 DIAGNOSIS — I10 Essential (primary) hypertension: Secondary | ICD-10-CM | POA: Diagnosis not present

## 2024-11-16 DIAGNOSIS — R7303 Prediabetes: Secondary | ICD-10-CM | POA: Diagnosis not present

## 2024-11-16 DIAGNOSIS — D508 Other iron deficiency anemias: Secondary | ICD-10-CM | POA: Diagnosis not present

## 2024-11-16 DIAGNOSIS — R632 Polyphagia: Secondary | ICD-10-CM | POA: Diagnosis not present

## 2024-11-24 ENCOUNTER — Ambulatory Visit: Admitting: Hematology and Oncology

## 2024-11-24 ENCOUNTER — Other Ambulatory Visit

## 2024-11-26 ENCOUNTER — Ambulatory Visit: Admitting: Family Medicine

## 2024-12-28 ENCOUNTER — Encounter: Payer: Self-pay | Admitting: Hematology and Oncology

## 2024-12-31 ENCOUNTER — Encounter: Payer: Self-pay | Admitting: Hematology and Oncology

## 2025-01-01 ENCOUNTER — Ambulatory Visit: Admitting: Family Medicine

## 2025-01-01 ENCOUNTER — Telehealth: Payer: Self-pay | Admitting: Hematology and Oncology

## 2025-01-01 NOTE — Telephone Encounter (Signed)
 I left voicemail for patient regarding rescheduled lab and MD appointment from 01/26/2025 to 04/27/2025 per patient's request.

## 2025-01-26 ENCOUNTER — Inpatient Hospital Stay: Admitting: Hematology and Oncology

## 2025-01-26 ENCOUNTER — Inpatient Hospital Stay

## 2025-03-05 ENCOUNTER — Ambulatory Visit: Admitting: Family Medicine

## 2025-04-27 ENCOUNTER — Inpatient Hospital Stay: Payer: Self-pay | Admitting: Hematology and Oncology

## 2025-04-27 ENCOUNTER — Inpatient Hospital Stay: Payer: Self-pay
